# Patient Record
Sex: Female | Born: 1940 | Race: White | Hispanic: No | Smoking: Never smoker
Health system: Southern US, Community
[De-identification: ages and names within clinical notes are randomized; demographics above are authoritative.]

## PROBLEM LIST (undated history)

## (undated) DIAGNOSIS — I10 Essential (primary) hypertension: Secondary | ICD-10-CM

## (undated) DIAGNOSIS — E785 Hyperlipidemia, unspecified: Secondary | ICD-10-CM

## (undated) DIAGNOSIS — N1832 Chronic kidney disease, stage 3b: Secondary | ICD-10-CM

## (undated) DIAGNOSIS — E041 Nontoxic single thyroid nodule: Secondary | ICD-10-CM

## (undated) DIAGNOSIS — I2510 Atherosclerotic heart disease of native coronary artery without angina pectoris: Secondary | ICD-10-CM

## (undated) DIAGNOSIS — E119 Type 2 diabetes mellitus without complications: Secondary | ICD-10-CM

## (undated) HISTORY — PX: CHOLECYSTECTOMY: SHX55

## (undated) HISTORY — PX: ANKLE SURGERY: SHX546

## (undated) HISTORY — PX: EYE SURGERY: SHX253

## (undated) HISTORY — PX: HUMERUS SURGERY: SHX672

---

## 2006-02-02 ENCOUNTER — Encounter: Payer: Self-pay | Admitting: Internal Medicine

## 2006-07-18 ENCOUNTER — Other Ambulatory Visit: Payer: Self-pay

## 2006-07-18 ENCOUNTER — Emergency Department: Payer: Self-pay | Admitting: Emergency Medicine

## 2006-07-18 ENCOUNTER — Ambulatory Visit: Payer: Self-pay | Admitting: Emergency Medicine

## 2007-01-11 ENCOUNTER — Ambulatory Visit: Payer: Self-pay | Admitting: Internal Medicine

## 2008-07-26 ENCOUNTER — Ambulatory Visit: Payer: Self-pay | Admitting: Ophthalmology

## 2008-08-08 ENCOUNTER — Ambulatory Visit: Payer: Self-pay | Admitting: Ophthalmology

## 2009-02-14 ENCOUNTER — Ambulatory Visit: Payer: Self-pay | Admitting: Ophthalmology

## 2009-02-21 ENCOUNTER — Ambulatory Visit: Payer: Self-pay | Admitting: Ophthalmology

## 2010-03-28 ENCOUNTER — Ambulatory Visit: Payer: Self-pay | Admitting: Internal Medicine

## 2010-08-09 ENCOUNTER — Ambulatory Visit: Payer: Self-pay | Admitting: Internal Medicine

## 2011-09-10 ENCOUNTER — Emergency Department: Payer: Self-pay | Admitting: Emergency Medicine

## 2011-09-10 ENCOUNTER — Ambulatory Visit: Payer: Self-pay | Admitting: Family Medicine

## 2011-09-10 LAB — CBC
MCH: 25.5 pg — ABNORMAL LOW (ref 26.0–34.0)
MCHC: 31.6 g/dL — ABNORMAL LOW (ref 32.0–36.0)
MCV: 81 fL (ref 80–100)
Platelet: 244 10*3/uL (ref 150–440)
RBC: 5.49 10*6/uL — ABNORMAL HIGH (ref 3.80–5.20)
RDW: 15.5 % — ABNORMAL HIGH (ref 11.5–14.5)

## 2011-09-10 LAB — BASIC METABOLIC PANEL
Calcium, Total: 9.2 mg/dL (ref 8.5–10.1)
Co2: 30 mmol/L (ref 21–32)
Creatinine: 1.82 mg/dL — ABNORMAL HIGH (ref 0.60–1.30)
EGFR (African American): 31 — ABNORMAL LOW
EGFR (Non-African Amer.): 27 — ABNORMAL LOW
Glucose: 68 mg/dL (ref 65–99)
Potassium: 4.1 mmol/L (ref 3.5–5.1)
Sodium: 144 mmol/L (ref 136–145)

## 2011-12-22 ENCOUNTER — Other Ambulatory Visit: Payer: Self-pay | Admitting: Podiatry

## 2012-08-26 ENCOUNTER — Inpatient Hospital Stay: Payer: Self-pay | Admitting: Student

## 2012-08-26 LAB — COMPREHENSIVE METABOLIC PANEL
Albumin: 3.8 g/dL (ref 3.4–5.0)
Anion Gap: 8 (ref 7–16)
BUN: 27 mg/dL — ABNORMAL HIGH (ref 7–18)
Bilirubin,Total: 0.3 mg/dL (ref 0.2–1.0)
Calcium, Total: 9.5 mg/dL (ref 8.5–10.1)
Co2: 28 mmol/L (ref 21–32)
Creatinine: 1.94 mg/dL — ABNORMAL HIGH (ref 0.60–1.30)
EGFR (African American): 29 — ABNORMAL LOW
EGFR (Non-African Amer.): 25 — ABNORMAL LOW
Glucose: 75 mg/dL (ref 65–99)
Osmolality: 287 (ref 275–301)
Potassium: 3.2 mmol/L — ABNORMAL LOW (ref 3.5–5.1)
SGPT (ALT): 39 U/L (ref 12–78)
Sodium: 142 mmol/L (ref 136–145)
Total Protein: 8 g/dL (ref 6.4–8.2)

## 2012-08-26 LAB — URINALYSIS, COMPLETE
Bilirubin,UR: NEGATIVE
Glucose,UR: NEGATIVE mg/dL (ref 0–75)
Nitrite: POSITIVE
RBC,UR: 6 /HPF (ref 0–5)
Specific Gravity: 1.021 (ref 1.003–1.030)
Squamous Epithelial: 9
WBC UR: 108 /HPF (ref 0–5)

## 2012-08-26 LAB — CBC
HCT: 43.2 % (ref 35.0–47.0)
HGB: 14.4 g/dL (ref 12.0–16.0)
MCH: 26 pg (ref 26.0–34.0)
MCHC: 33.4 g/dL (ref 32.0–36.0)
Platelet: 273 10*3/uL (ref 150–440)
RBC: 5.54 10*6/uL — ABNORMAL HIGH (ref 3.80–5.20)
WBC: 10.6 10*3/uL (ref 3.6–11.0)

## 2012-08-26 LAB — CK TOTAL AND CKMB (NOT AT ARMC): CK-MB: 2.9 ng/mL (ref 0.5–3.6)

## 2012-08-27 LAB — BASIC METABOLIC PANEL
Anion Gap: 10 (ref 7–16)
Calcium, Total: 8.9 mg/dL (ref 8.5–10.1)
Chloride: 107 mmol/L (ref 98–107)
Co2: 25 mmol/L (ref 21–32)
Creatinine: 1.45 mg/dL — ABNORMAL HIGH (ref 0.60–1.30)
EGFR (African American): 41 — ABNORMAL LOW
EGFR (Non-African Amer.): 35 — ABNORMAL LOW
Osmolality: 290 (ref 275–301)
Sodium: 142 mmol/L (ref 136–145)

## 2012-08-28 LAB — BASIC METABOLIC PANEL
Anion Gap: 8 (ref 7–16)
Calcium, Total: 8.9 mg/dL (ref 8.5–10.1)
Chloride: 109 mmol/L — ABNORMAL HIGH (ref 98–107)
EGFR (African American): 49 — ABNORMAL LOW
EGFR (Non-African Amer.): 42 — ABNORMAL LOW
Glucose: 153 mg/dL — ABNORMAL HIGH (ref 65–99)
Osmolality: 285 (ref 275–301)
Potassium: 3.8 mmol/L (ref 3.5–5.1)
Sodium: 140 mmol/L (ref 136–145)

## 2012-08-28 LAB — URINE CULTURE

## 2013-03-14 ENCOUNTER — Ambulatory Visit: Payer: Self-pay | Admitting: Physician Assistant

## 2013-03-14 LAB — URINALYSIS, COMPLETE
Bilirubin,UR: NEGATIVE
Blood: NEGATIVE
KETONE: NEGATIVE
NITRITE: NEGATIVE
Ph: 6 (ref 4.5–8.0)
Specific Gravity: 1.02 (ref 1.003–1.030)

## 2013-03-16 LAB — URINE CULTURE

## 2013-07-05 ENCOUNTER — Ambulatory Visit: Payer: Self-pay | Admitting: Emergency Medicine

## 2013-09-22 ENCOUNTER — Ambulatory Visit: Payer: Self-pay

## 2014-01-20 ENCOUNTER — Ambulatory Visit: Payer: Self-pay | Admitting: Physician Assistant

## 2014-01-20 LAB — URINALYSIS, COMPLETE
BILIRUBIN, UR: NEGATIVE
GLUCOSE, UR: NEGATIVE
Ketone: NEGATIVE
Nitrite: NEGATIVE
Ph: 5.5 (ref 5.0–8.0)
SPECIFIC GRAVITY: 1.02 (ref 1.000–1.030)

## 2014-01-22 LAB — URINE CULTURE

## 2014-05-28 ENCOUNTER — Emergency Department
Admit: 2014-05-28 | Disposition: A | Discharge status: Home / Self Care | Payer: Self-pay | Admitting: Emergency Medicine

## 2014-05-28 LAB — CBC WITH DIFFERENTIAL/PLATELET
Basophil #: 0.1 10*3/uL (ref 0.0–0.1)
Basophil %: 1.1 %
EOS ABS: 0.3 10*3/uL (ref 0.0–0.7)
Eosinophil %: 3.6 %
HCT: 41.3 % (ref 35.0–47.0)
HGB: 13.3 g/dL (ref 12.0–16.0)
LYMPHS ABS: 4.2 10*3/uL — AB (ref 1.0–3.6)
Lymphocyte %: 44.6 %
MCH: 25.9 pg — AB (ref 26.0–34.0)
MCHC: 32.1 g/dL (ref 32.0–36.0)
MCV: 81 fL (ref 80–100)
MONO ABS: 0.9 x10 3/mm (ref 0.2–0.9)
Monocyte %: 9.9 %
Neutrophil #: 3.8 10*3/uL (ref 1.4–6.5)
Neutrophil %: 40.8 %
Platelet: 217 10*3/uL (ref 150–440)
RBC: 5.13 10*6/uL (ref 3.80–5.20)
RDW: 15.5 % — ABNORMAL HIGH (ref 11.5–14.5)
WBC: 9.4 10*3/uL (ref 3.6–11.0)

## 2014-05-28 LAB — COMPREHENSIVE METABOLIC PANEL
ALBUMIN: 3.7 g/dL
ALK PHOS: 103 U/L
AST: 28 U/L
Anion Gap: 6 — ABNORMAL LOW (ref 7–16)
BUN: 21 mg/dL — AB
Bilirubin,Total: 0.6 mg/dL
CHLORIDE: 109 mmol/L
Calcium, Total: 9.3 mg/dL
Co2: 26 mmol/L
Creatinine: 1.36 mg/dL — ABNORMAL HIGH
EGFR (Non-African Amer.): 38 — ABNORMAL LOW
GFR CALC AF AMER: 44 — AB
GLUCOSE: 142 mg/dL — AB
POTASSIUM: 4.3 mmol/L
SGPT (ALT): 32 U/L
Sodium: 141 mmol/L
TOTAL PROTEIN: 6.7 g/dL

## 2014-06-16 NOTE — Discharge Summary (Signed)
PATIENT NAME:  Laura Hatfield, Laura Hatfield MR#:  [id number] DATE OF BIRTH:  March 23, 1940  DATE OF ADMISSION:  08/26/2012 DATE OF DISCHARGE:  08/29/2012  PRIMARY CARE PHYSICIAN: Dr.  Olin Hauser at [facility].   CHIEF COMPLAINT:  Generalized weakness, near syncope feeling, cramps.   DISCHARGE DIAGNOSES: 1.  Generalized weakness, likely in the setting of dehydration, hyperkalemia and urinary tract infection.  2.  Acute on likely chronic renal failure.  3.  Urinary tract infection.  4.  Diabetes.  5.  Hypertension.  6.  Chronic congestive heart failure, compensated, unclear if systolic or diastolic.  7.  History of coronary artery disease status post stents.  8.  History of peripheral neuropathy.  9.  History of dyslipidemia.  10.  History of osteoarthritis.   DISCHARGE MEDICATIONS:  1.  Cipro 250 mg twice a day for two days.  2.   Aspirin 81 mg daily.  3.  Clopidogrel 75 mg daily.  4.  Vitamin B12 tabs 1000 mcg 3 times once a day.  5.  Vytorin 10/40, 1 tab once a day.  6.  Gabapentin 300 mg daily as needed for neuropathy.  7.  Lantus 40 units once a day in the evening.  8.  Losartan 100 mg daily.  9.  Metoprolol succinate 100 mg once a day.  10.  Multivitamin 1 tab once a day.  11.  Nitroglycerin 0.4 mg sublingual every five minutes as needed for chest pain.  12.  Lasix 40 mg daily.   DIET: Low sodium, low fat, low cholesterol, ADA diet.   ACTIVITY: As tolerated.   FOLLOWUP: Please follow with PCP within 1 to 2 weeks and obtain a BMP within a week. Please follow with your cardiologist within 1 to 2 weeks.   DISPOSITION: Home.   CODE STATUS: Full code.   SIGNIFICANT LABORATORY, DIAGNOSTIC AND RADIOLOGIC DATA:  Urinalysis initially was positive for nitrites, 3+ leukocyte esterase, 6 RBC and 108 WBC with 2+ bacteria. Urine cultures grew 60,000 CFU of Escherichia coli ulcer with mixed bacterial organisms, which was pansensitive. Initial white count of 10, WBC of 14.4 and  platelets of 273. Troponin negative. Alkaline phosphatase 142; otherwise, LFTs within normal limits. Initial creatinine was 1.94 and potassium of 3.2. By July 5, it was 1.25 with hydration with potassium of 3.8.   HISTORY OF PRESENT ILLNESS AND HOSPITAL COURSE: For full details of H and P, please see the dictation by Dr. Rudene Re on July 3,  but briefly this is a pleasant 74 year old female with history of obesity, hypertension, diabetes, chronic kidney disease, coronary artery disease, status post stents, who came in for generalized weakness and some cramps. She had no fevers, chills, vomiting, or diarrhea and denied any urinary symptoms, but was admitted to the hospitalist service. She was noted to have a positive urinalysis, which was positive with nitrites and leukocyte esterase with some WBC and urine as well. She was also noted to have, creatinine 1.9 and some hypokalemia. She was admitted to the hospitalist service, started on ceftriaxone. Urine cultures were sent and the Lasix was held. The patient appeared to be dehydrated on exam and improved with hydration. The urine cultures came back 60,000 CFU, but along with other mixed flora. She had been started on ceftriaxone and has had three doses and will be discharged with two days of Cipro. It is unclear where her creatinine is; however, given the fact that she had elevated BUN, creatinine and hypokalemia perhaps. The creatinine of 1.9, is  not her baseline. It is possible she is chronic kidney disease stage III. By the date of discharge, she has been ambulating in the hall. Her potassium has been repleted and her weakness has significantly improved. She will be discharged on a lower dose of Lasix for now until she is seen by PCP and cardiologist. She appears to be compensated and did not go into congestive heart failure with IV fluids. She will be discharged with outpatient follow-up.   TOTAL TIME SPENT: 35 minutes.   CODE STATUS: The patient is full code    ____________________________ Krystal EatonShayiq Rayyan Orsborn, MD sa:cc D: 08/29/2012 12:17:42 ET T: 08/29/2012 20:36:39 ET JOB#: [id number]  cc: Krystal EatonShayiq Abri Vacca, MD, <Dictator> Olin HauserBruce Peyser, MD, [facility]AY[facility] Madolyn Ackroyd MD ELECTRONICALLY SIGNED 09/20/2012 14:32

## 2014-06-16 NOTE — H&P (Signed)
PATIENT NAME:  Laura Hatfield, Laura Hatfield MR#:  [id number] DATE OF BIRTH:  December 27, 1940  DATE OF ADMISSION:  08/26/2012  PRIMARY CARE PHYSICIAN[facility]  REFERRING PHYSICIAN: Dr. Bayard Malesandolph Brown   CHIEF COMPLAINT: Generalized weakness, near syncope feeling, muscle cramps.   HISTORY OF PRESENT ILLNESS: Ms. Laura Hatfield is a 74 year old African American female with history of systemic hypertension, diabetes mellitus type 2 on insulin, chronic kidney disease stage IV, coronary artery disease, peripheral neuropathy. She was in her usual state of health until the last 24 hours. For some reason she feels generalized weakness.  She is unable to hold her strength,  feels she is going to pass out without an episode of true loss of consciousness, has nausea, muscular cramps in the abdomen and her legs. Denies any fever. No chills. No vomiting. No diarrhea. No urinary symptoms. The patient was brought to the hospital for evaluation, and she was found to have urinary tract infection along with finding of dehydration, slight worsening of her kidney function. Her baseline creatinine was 1.8 a year ago, now it is 1.9, and she has mild hypokalemia.   REVIEW OF SYSTEMS:   CONSTITUTIONAL: Denies any fever. No chills, but she has generalized fatigue.  EYES: No blurring of vision. No double vision.  ENT: No hearing impairment. No sore throat. No dysphagia.  CARDIOVASCULAR: Denies any chest pain. No shortness of breath. No true syncope, but she has near syncope feeling.  RESPIRATORY: No cough. No sputum production. No chest pain. No shortness of breath.  GASTROINTESTINAL: No abdominal pain, but she has on and off muscular cramps, nonspecified, and nausea but no vomiting, no diarrhea, no melena or hematochezia.  GENITOURINARY: Denies dysuria. No frequency of urination.  MUSCULOSKELETAL: She has chronic aching pains but no joint swelling and no specific joint pain. She has muscular cramps but no  muscular swelling.  INTEGUMENTARY: No skin rash. No ulcers.  NEUROLOGY: No focal weakness. No seizure activity. No headache.  PSYCHIATRY: No anxiety. No depression.  ENDOCRINE: No polyuria or polydipsia. No heat or cold intolerance.  PAST MEDICAL HISTORY: Coronary artery disease, status post stent implant, systemic hypertension, congestive heart failure compensated. We do not have information about her ejection fraction. [facility]. Diabetes mellitus type 2 on insulin, peripheral neuropathy, chronic kidney disease stage IV, dyslipidemia, osteoarthritis.   PAST SURGICAL HISTORY: Cholecystectomy, knee surgery for meniscal tear, ankle fracture surgery. She had lung surgery many years ago, thought she had tuberculosis but it was not. She does not know what the final pathology was.   SOCIAL HABITS: Nonsmoker. No history of alcohol or drug abuse.   SOCIAL HISTORY: She is divorced and lives at home alone.   FAMILY HISTORY: Her mother is alive, and she suffers from diabetes mellitus. Her father is deceased, and he died from complications of stroke.   ADMISSION MEDICATIONS: Vytorin 10/40 once a day, nitroglycerin sublingual 0.4 mg q. 5 minutes p.r.n., naproxen 220 mg 2 tablets q. 8 hours p.r.n., multivitamin once a day, metoprolol succinate 100 mg once a day, losartan 100 mg once a day, Lantus 40 units at night, gabapentin 100 mg 3 capsules, that is 300 mg, 3 times a day as needed, furosemide 40 mg taking 1-1/2 tablets, that is a 60 mg, once a day, vitamin B12 1000 mcg 3 tablets once a day in the evening, Plavix 75 mg once a day, aspirin 81 mg a day.   ALLERGIES: LIPITOR CAUSING SOME  AGITATION, PENICILLIN CAUSING RASH AND LISINOPRIL.   PHYSICAL EXAMINATION: VITAL SIGNS: Blood pressure 117/69. She has orthostatic blood pressure and pulse. There are no changes.  Respiratory 20, pulse 68, O2 saturation 99%.  GENERAL APPEARANCE: Elderly female lying in bed in no  acute distress.  HEENT: No pallor. No icterus. No cyanosis. Ear examination revealed normal hearing, no discharge, no lesions. Examination of the nose showed no discharge, no bleeding, no ulcers. Examination of the mouth showed normal lips and tongue except for dry mucous membranes, no oral thrush. Eye examination revealed normal eyelids and conjunctivae. Pupils are about 3 mm, round, equal and sluggishly reactive to light.  NECK: Supple. Trachea at midline. No thyromegaly. No cervical lymphadenopathies.  HEART: Revealed normal S1, S2. No S3, S4. No murmur. No gallop. No carotid bruits.   RESPIRATORY: Normal breathing pattern without use of accessory muscles. No rales. No wheezing.  ABDOMEN: Soft without tenderness. No hepatosplenomegaly. No masses. No hernias.  SKIN: Revealed no ulcers. No subcutaneous nodules.  MUSCULOSKELETAL: No joint swelling. No clubbing.  NEUROLOGIC: Cranial nerves II through XII are intact. No focal motor deficit.  PSYCHIATRIC: The patient is alert and oriented x 3. Mood and affect were normal.   LABORATORY AND RADIOLOGICAL FINDINGS:  CAT scan of the abdomen showed no evidence of bowel obstruction or any acute findings. There is enlargement of the uterus, may reflect large fibroid.  EKG showed normal sinus rhythm at rate of 71 per minute. Nonspecific T wave abnormalities. Serum glucose 75, BUN 27, creatinine 1.94, sodium 142, potassium 3.2. Estimated GFR is 29. Normal liver function tests except for slight elevation of alkaline phosphatase at 142, AST 23, ALT 39. Troponin less than 0.02. CBC showed white count of 10,000, hemoglobin 14, hematocrit 43, platelet count 273. Urinalysis showed cloudy urine with positive nitrite, +3 leukocyte esterase, 108 white blood cells, +2 bacteria.   ASSESSMENT: 1.  Generalized weakness. 2.  Near syncope.  3.  Urinary tract infection, appears to be lower urinary tract infection.  4.  Chronic kidney disease stage IV.  5.  Hypokalemia.   6.  Hypertension.  7.  Diabetes mellitus type 2.  8.  Coronary artery disease, status post stent implant.  9.  History of congestive heart failure which is compensated.  10.  Peripheral neuropathy.   PLAN: We will admit the patient to the medical floor. IV hydration, intravenous Rocephin. Potassium supplementation to correct the hypokalemia. IV fluid. Hold Lasix and naproxen given her kidney function and the dehydration as well. Repeat basic metabolic profile tomorrow. Urine for culture and sensitivity. Continue the rest of her home medications. For deep vein thrombosis prophylaxis, we will place her on Lovenox 30 mg subcutaneous once a day.   CODE STATUS:  The patient indicates that she does have a LIVING WILL and her CODE STATUS IS FULL CODE unless she is in a vegetative state, then she does not want to be placed on life support indefinitely.  TIME TAKEN: Time spent in evaluating this patient and reviewing the data with her and with the rest of her family took more than 55 minutes.  ____________________________ Carney Corners. Rudene Re, MD amd:cb D: 08/26/2012 22:41:23 ET T: 08/26/2012 23:09:21 ET JOB#: [id number]  cc: Carney Corners. Rudene Re, MD, <Dictator> Zollie Scale MD ELECTRONICALLY SIGNED 08/27/2012 6:25

## 2016-03-21 ENCOUNTER — Observation Stay: Payer: Medicare HMO

## 2016-03-21 ENCOUNTER — Emergency Department: Payer: Medicare HMO

## 2016-03-21 ENCOUNTER — Observation Stay
Admission: EM | Admit: 2016-03-21 | Discharge: 2016-03-22 | Disposition: A | Discharge status: Home / Self Care | Payer: Medicare HMO | Attending: Internal Medicine | Admitting: Internal Medicine

## 2016-03-21 ENCOUNTER — Encounter: Payer: Self-pay | Admitting: Emergency Medicine

## 2016-03-21 DIAGNOSIS — E119 Type 2 diabetes mellitus without complications: Secondary | ICD-10-CM | POA: Diagnosis not present

## 2016-03-21 DIAGNOSIS — Z79899 Other long term (current) drug therapy: Secondary | ICD-10-CM | POA: Diagnosis not present

## 2016-03-21 DIAGNOSIS — I10 Essential (primary) hypertension: Secondary | ICD-10-CM | POA: Diagnosis not present

## 2016-03-21 DIAGNOSIS — Z7982 Long term (current) use of aspirin: Secondary | ICD-10-CM | POA: Diagnosis not present

## 2016-03-21 DIAGNOSIS — E785 Hyperlipidemia, unspecified: Secondary | ICD-10-CM | POA: Insufficient documentation

## 2016-03-21 DIAGNOSIS — R4701 Aphasia: Secondary | ICD-10-CM

## 2016-03-21 DIAGNOSIS — I2510 Atherosclerotic heart disease of native coronary artery without angina pectoris: Secondary | ICD-10-CM | POA: Insufficient documentation

## 2016-03-21 DIAGNOSIS — G459 Transient cerebral ischemic attack, unspecified: Secondary | ICD-10-CM

## 2016-03-21 DIAGNOSIS — R479 Unspecified speech disturbances: Secondary | ICD-10-CM | POA: Diagnosis present

## 2016-03-21 DIAGNOSIS — Z794 Long term (current) use of insulin: Secondary | ICD-10-CM | POA: Insufficient documentation

## 2016-03-21 DIAGNOSIS — Z791 Long term (current) use of non-steroidal anti-inflammatories (NSAID): Secondary | ICD-10-CM | POA: Insufficient documentation

## 2016-03-21 DIAGNOSIS — I639 Cerebral infarction, unspecified: Principal | ICD-10-CM | POA: Insufficient documentation

## 2016-03-21 DIAGNOSIS — Z7902 Long term (current) use of antithrombotics/antiplatelets: Secondary | ICD-10-CM | POA: Diagnosis not present

## 2016-03-21 HISTORY — DX: Nontoxic single thyroid nodule: E04.1

## 2016-03-21 HISTORY — DX: Hyperlipidemia, unspecified: E78.5

## 2016-03-21 HISTORY — DX: Atherosclerotic heart disease of native coronary artery without angina pectoris: I25.10

## 2016-03-21 HISTORY — DX: Type 2 diabetes mellitus without complications: E11.9

## 2016-03-21 LAB — CBC
HEMATOCRIT: 43.5 % (ref 35.0–47.0)
HEMOGLOBIN: 14.2 g/dL (ref 12.0–16.0)
MCH: 25.8 pg — ABNORMAL LOW (ref 26.0–34.0)
MCHC: 32.7 g/dL (ref 32.0–36.0)
MCV: 78.8 fL — ABNORMAL LOW (ref 80.0–100.0)
Platelets: 238 10*3/uL (ref 150–440)
RBC: 5.52 MIL/uL — AB (ref 3.80–5.20)
RDW: 15.1 % — ABNORMAL HIGH (ref 11.5–14.5)
WBC: 8.3 10*3/uL (ref 3.6–11.0)

## 2016-03-21 LAB — COMPREHENSIVE METABOLIC PANEL
ALBUMIN: 3.9 g/dL (ref 3.5–5.0)
ALK PHOS: 100 U/L (ref 38–126)
ALT: 23 U/L (ref 14–54)
AST: 31 U/L (ref 15–41)
Anion gap: 7 (ref 5–15)
BILIRUBIN TOTAL: 0.6 mg/dL (ref 0.3–1.2)
BUN: 22 mg/dL — ABNORMAL HIGH (ref 6–20)
CO2: 27 mmol/L (ref 22–32)
CREATININE: 1.42 mg/dL — AB (ref 0.44–1.00)
Calcium: 9.2 mg/dL (ref 8.9–10.3)
Chloride: 104 mmol/L (ref 101–111)
GFR calc Af Amer: 40 mL/min — ABNORMAL LOW (ref >60)
GFR, EST NON AFRICAN AMERICAN: 35 mL/min — AB (ref >60)
GLUCOSE: 205 mg/dL — AB (ref 65–99)
Potassium: 4.3 mmol/L (ref 3.5–5.1)
Sodium: 138 mmol/L (ref 135–145)
Total Protein: 7.4 g/dL (ref 6.5–8.1)

## 2016-03-21 LAB — GLUCOSE, CAPILLARY
GLUCOSE-CAPILLARY: 141 mg/dL — AB (ref 65–99)
Glucose-Capillary: 184 mg/dL — ABNORMAL HIGH (ref 65–99)
Glucose-Capillary: 191 mg/dL — ABNORMAL HIGH (ref 65–99)

## 2016-03-21 LAB — TROPONIN I: Troponin I: <0.03 ng/mL (ref <0.03)

## 2016-03-21 LAB — DIFFERENTIAL
BASOS ABS: 0.1 10*3/uL (ref 0–0.1)
Basophils Relative: 1 %
Eosinophils Absolute: 0.3 10*3/uL (ref 0–0.7)
Eosinophils Relative: 3 %
LYMPHS ABS: 3.5 10*3/uL (ref 1.0–3.6)
LYMPHS PCT: 42 %
MONOS PCT: 11 %
Monocytes Absolute: 0.9 10*3/uL (ref 0.2–0.9)
NEUTROS ABS: 3.6 10*3/uL (ref 1.4–6.5)
Neutrophils Relative %: 43 %

## 2016-03-21 LAB — PROTIME-INR
INR: 0.96
Prothrombin Time: 12.8 seconds (ref 11.4–15.2)

## 2016-03-21 LAB — APTT: APTT: 37 s — AB (ref 24–36)

## 2016-03-21 MED ORDER — ASPIRIN EC 81 MG PO TBEC
81.0000 mg | DELAYED_RELEASE_TABLET | Freq: Every day | ORAL | Status: DC
  Administered 2016-03-21 – 2016-03-22 (×2): 81 mg via ORAL
  Filled 2016-03-21 (×2): qty 1

## 2016-03-21 MED ORDER — INSULIN ASPART PROT & ASPART (70-30 MIX) 100 UNIT/ML ~~LOC~~ SUSP
45.0000 [IU] | Freq: Every day | SUBCUTANEOUS | Status: DC
  Administered 2016-03-21: 45 [IU] via SUBCUTANEOUS
  Filled 2016-03-21: qty 45

## 2016-03-21 MED ORDER — INSULIN ASPART 100 UNIT/ML ~~LOC~~ SOLN
0.0000 [IU] | Freq: Three times a day (TID) | SUBCUTANEOUS | Status: DC
  Administered 2016-03-21: 1 [IU] via SUBCUTANEOUS
  Administered 2016-03-22: 2 [IU] via SUBCUTANEOUS
  Filled 2016-03-21: qty 2
  Filled 2016-03-21: qty 1

## 2016-03-21 MED ORDER — POLYETHYLENE GLYCOL 3350 17 G PO PACK
17.0000 g | PACK | Freq: Every day | ORAL | Status: DC | PRN
Start: 2016-03-21 — End: 2016-03-22

## 2016-03-21 MED ORDER — DULAGLUTIDE 0.75 MG/0.5ML ~~LOC~~ SOAJ: 0.7500 mg | SUBCUTANEOUS | Status: DC

## 2016-03-21 MED ORDER — ACETAMINOPHEN 650 MG RE SUPP: 650.0000 mg | Freq: Four times a day (QID) | RECTAL | Status: DC | PRN

## 2016-03-21 MED ORDER — ONDANSETRON HCL 4 MG PO TABS: 4.0000 mg | ORAL_TABLET | Freq: Four times a day (QID) | ORAL | Status: DC | PRN

## 2016-03-21 MED ORDER — METOPROLOL SUCCINATE ER 100 MG PO TB24
150.0000 mg | ORAL_TABLET | Freq: Every day | ORAL | Status: DC
  Administered 2016-03-21: 150 mg via ORAL
  Filled 2016-03-21 (×2): qty 2

## 2016-03-21 MED ORDER — INSULIN ASPART PROT & ASPART (70-30 MIX) 100 UNIT/ML ~~LOC~~ SUSP
40.0000 [IU] | Freq: Every day | SUBCUTANEOUS | Status: DC
  Administered 2016-03-22: 40 [IU] via SUBCUTANEOUS
  Filled 2016-03-21: qty 40

## 2016-03-21 MED ORDER — ACETAMINOPHEN 325 MG PO TABS: 650.0000 mg | ORAL_TABLET | Freq: Four times a day (QID) | ORAL | Status: DC | PRN

## 2016-03-21 MED ORDER — FUROSEMIDE 40 MG PO TABS: 40.0000 mg | ORAL_TABLET | Freq: Every day | ORAL | Status: DC | PRN

## 2016-03-21 MED ORDER — CLOPIDOGREL BISULFATE 75 MG PO TABS
75.0000 mg | ORAL_TABLET | Freq: Every day | ORAL | Status: DC
  Administered 2016-03-21 – 2016-03-22 (×2): 75 mg via ORAL
  Filled 2016-03-21 (×2): qty 1

## 2016-03-21 MED ORDER — ONDANSETRON HCL 4 MG/2ML IJ SOLN: 4.0000 mg | Freq: Four times a day (QID) | INTRAMUSCULAR | Status: DC | PRN

## 2016-03-21 MED ORDER — ROSUVASTATIN CALCIUM 20 MG PO TABS
40.0000 mg | ORAL_TABLET | Freq: Every day | ORAL | Status: DC
  Administered 2016-03-21 – 2016-03-22 (×2): 40 mg via ORAL
  Filled 2016-03-21 (×2): qty 2

## 2016-03-21 MED ORDER — VITAMIN B-12 1000 MCG PO TABS
2000.0000 ug | ORAL_TABLET | Freq: Every day | ORAL | Status: DC
  Administered 2016-03-22: 09:00:00 2000 ug via ORAL
  Filled 2016-03-21: qty 2

## 2016-03-21 MED ORDER — ENOXAPARIN SODIUM 40 MG/0.4ML ~~LOC~~ SOLN
40.0000 mg | SUBCUTANEOUS | Status: DC
  Administered 2016-03-21: 40 mg via SUBCUTANEOUS
  Filled 2016-03-21: qty 0.4

## 2016-03-21 NOTE — H&P (Signed)
[facility] [place] [facility]   PATIENT NAME: Laura Hatfield    MR#:  [id number]  DATE OF BIRTH:  Mar 23, 1940  DATE OF ADMISSION:  03/21/2016  PRIMARY CARE PHYSICIAN: PEYSER,BRUCE   REQUESTING/REFERRING PHYSICIAN: dr Thomasene Mohairpaduhowski  CHIEF COMPLAINT:  Difficulty producing words yesterday and this morning Complains of dizziness and headache  HISTORY OF PRESENT ILLNESS:  Laura Hatfield  is a 76 y.o. female with a known history of Hyperlipidemia, diabetes, coronary artery disease comes to the emergency room after she started having dizziness and an episode of headache yesterday. Patient noticed some expressive aphasia while she was trying to talk to her mother's caregiver while fixing breakfast this morning. It looks it happened again on 11 AM she got worried came to the emergency room and code stroke was initiated. CT head was negative. She was seen by Dr. Thad Rangereynolds from neurology and he is now being admitted for TIA. She is hemodynamically stable speech is clear to my evaluation. Patient denies any other complaints at present.  PAST MEDICAL HISTORY:   Past Medical History:  Diagnosis Date  . CAD (coronary artery disease)   . Diabetes mellitus without complication (HCC)   . Hyperlipidemia   . Thyroid nodule     PAST SURGICAL HISTOIRY:   Past Surgical History:  Procedure Laterality Date  . ANKLE SURGERY    . CHOLECYSTECTOMY    . EYE SURGERY    . HUMERUS SURGERY      SOCIAL HISTORY:   Social History  Substance Use Topics  . Smoking status: Never Smoker  . Smokeless tobacco: Never Used  . Alcohol use No    FAMILY HISTORY:  No family history on file.  DRUG ALLERGIES:   Allergies  Allergen Reactions  . Atorvastatin Other (See Comments)    Dr told her taking these were affecting her liver  . Lisinopril Cough  . Penicillins Swelling    Has patient had a PCN reaction causing immediate rash, facial/tongue/throat swelling, SOB or  lightheadedness with hypotension: yes Has patient had a PCN reaction causing severe rash involving mucus membranes or skin necrosis: no Has patient had a PCN reaction that required hospitalization no Has patient had a PCN reaction occurring within the last 10 years: no If all of the above answers are "NO", then may proceed with Cephalosporin use.     REVIEW OF SYSTEMS:  Review of Systems  Constitutional: Negative for chills, fever and weight loss.  HENT: Negative for ear discharge, ear pain and nosebleeds.   Eyes: Negative for blurred vision, pain and discharge.  Respiratory: Negative for sputum production, shortness of breath, wheezing and stridor.   Cardiovascular: Negative for chest pain, palpitations, orthopnea and PND.  Gastrointestinal: Negative for abdominal pain, diarrhea, nausea and vomiting.  Genitourinary: Negative for frequency and urgency.  Musculoskeletal: Negative for back pain and joint pain.  Neurological: Positive for speech change, weakness and headaches. Negative for sensory change and focal weakness.  Psychiatric/Behavioral: Negative for depression and hallucinations. The patient is not nervous/anxious.      MEDICATIONS AT HOME:   Prior to Admission medications   Medication Sig Start Date End Date Taking? Authorizing Provider  aspirin EC 81 MG tablet Take 81 mg by mouth daily.   Yes Historical Provider, MD  clopidogrel (PLAVIX) 75 MG tablet Take 75 mg by mouth daily.   Yes Historical Provider, MD  cyanocobalamin 2000 MCG tablet Take 2,000 mcg by mouth daily.   Yes Historical Provider, MD  Dulaglutide (TRULICITY)  0.75 MG/0.5ML SOPN Inject 0.75 mg into the skin once a week.   Yes Historical Provider, MD  furosemide (LASIX) 40 MG tablet Take 1-1.5 tablets by mouth daily as needed. 01/04/15  Yes Historical Provider, MD  insulin NPH-regular Human (NOVOLIN 70/30) (70-30) 100 UNIT/ML injection Inject into the skin.   Yes Historical Provider, MD  metoprolol succinate  (TOPROL-XL) 100 MG 24 hr tablet Take 1.5 tablets by mouth daily. Need to call pharmacy to verify dosage    Yes Historical Provider, MD  naproxen sodium (ANAPROX) 220 MG tablet Take 220 mg by mouth 2 (two) times daily with a meal.   Yes Historical Provider, MD  nitroGLYCERIN (NITROSTAT) 0.4 MG SL tablet Place 1 tablet under the tongue every 5 (five) minutes as needed for chest pain. 03/30/14  Yes Historical Provider, MD  rosuvastatin (CRESTOR) 40 MG tablet Take 1 tablet by mouth daily. 09/22/15  Yes Historical Provider, MD      VITAL SIGNS:  Blood pressure 139/71, pulse 70, temperature 97.9 F (36.6 C), resp. rate 14, height 5\' 7"  (1.702 m), weight 107.5 kg (237 lb), SpO2 100 %.  PHYSICAL EXAMINATION:  GENERAL:  76 y.o.-year-old patient lying in the bed with no acute distress. Obese  EYES: Pupils equal, round, reactive to light and accommodation. No scleral icterus. Extraocular muscles intact.  HEENT: Head atraumatic, normocephalic. Oropharynx and nasopharynx clear.  NECK:  Supple, no jugular venous distention. No thyroid enlargement, no tenderness.  LUNGS: Normal breath sounds bilaterally, no wheezing, rales,rhonchi or crepitation. No use of accessory muscles of respiration.  CARDIOVASCULAR: S1, S2 normal. No murmurs, rubs, or gallops.  ABDOMEN: Soft, nontender, nondistended. Bowel sounds present. No organomegaly or mass.  EXTREMITIES: No pedal edema, cyanosis, or clubbing.  NEUROLOGIC: Cranial nerves II through XII are intact. Muscle strength 5/5 in all extremities. Sensation intact. Gait not checked.  expressive aphasia.  PSYCHIATRIC: The patient is alert and oriented x 3.  SKIN: No obvious rash, lesion, or ulcer.   LABORATORY PANEL:   CBC  Recent Labs Lab 03/21/16 1247  WBC 8.3  HGB 14.2  HCT 43.5  PLT 238   ------------------------------------------------------------------------------------------------------------------  Chemistries   Recent Labs Lab 03/21/16 1247  NA 138   K 4.3  CL 104  CO2 27  GLUCOSE 205*  BUN 22*  CREATININE 1.42*  CALCIUM 9.2  AST 31  ALT 23  ALKPHOS 100  BILITOT 0.6   ------------------------------------------------------------------------------------------------------------------  Cardiac Enzymes  Recent Labs Lab 03/21/16 1247  TROPONINI <0.03   ------------------------------------------------------------------------------------------------------------------  RADIOLOGY:  Ct Head Code Stroke W/o Cm  Result Date: 03/21/2016 CLINICAL DATA:  Code stroke. Episode of not being able to talk. Posterior headache going to the neck. EXAM: CT HEAD WITHOUT CONTRAST TECHNIQUE: Contiguous axial images were obtained from the base of the skull through the vertex without intravenous contrast. COMPARISON:  None. FINDINGS: Brain: No evidence of acute infarction, hemorrhage, hydrocephalus, extra-axial collection or shift. Confluent low-density in the cerebral white matter consistent with chronic microvascular disease, extensive. There is atrophy, most notable in the cerebellum with fourth ventriculomegaly. 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 Metastasis, lymphoma, or granulomatous disease, or suprasellar proteinaceous cyst are primary differential considerations. Further workup recommended. Vascular: Atherosclerotic calcification. Skull: No acute or aggressive finding Sinuses/Orbits: No acute finding Other: These results were called by telephone at the time of interpretation on 03/21/2016 at 12:51 pm to Dr. Lenard Lance , who verbally acknowledged these results. ASPECTS [facility] Stroke Program Early CT Score) - Ganglionic  level infarction (caudate, lentiform nuclei, internal capsule, insula, M1-M3 cortex): - Supraganglionic infarction (M4-M6 cortex): Total score (0-10 with 10 being normal): IMPRESSION: 1. No acute finding. ASPECTS is 10. 2. Advanced chronic microvascular disease. 3. 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 Recommend pituitary protocol brain MRI. Electronically Signed   By: Marnee Spring M.D.   On: 03/21/2016 12:57    EKG:   SR IMPRESSION AND PLAN:   Laura Hatfield  is a 76 y.o. female with a known history of Hyperlipidemia, diabetes, coronary artery disease comes to the emergency room after she started having dizziness and an episode of headache yesterday. Patient noticed some expressive aphasia while she was trying to talk to her mother's caregiver while fixing breakfast this morning.   1. TIA - patient presented with expressive aphasia and dizziness and headache. CT head negative. Symptoms resolved at present -Continue aspirin and Plavix -seen by neurology Dr. Emmaline Life recommends MRI ultrasound of the carotids  2. Hyperlipidemia continue Crestor  3. Hypertension continue metoprolol  4. Type 2 diabetes continue home dose insulin and sliding scale  5. DVT prophylaxis subcutaneous Lovenox  All the records are reviewed and case discussed with ED provider. Management plans discussed with the patient, family and they are in agreement.  CODE STATUS: full TOTAL TIME TAKING CARE OF THIS PATIENT: 50 minutes.    Laura Hatfield M.D on 03/21/2016 at 2:09 PM  Between 7am to 6pm - Pager - [phone]  After 6pm go to [email or web address] - password [email or web address] [facility]  [facility]  Office  [phone]  CC: Primary care physician; [facility]

## 2016-03-21 NOTE — Consult Note (Addendum)
Referring Physician: Mody    Chief Complaint: Difficulty with speech  HPI: Laura Hatfield is an 76 y.o. female who reports that on yesterday she had a 15 minute episode of difficulty getting her words out that resolved spontaneously.  Today while speaking with a Child psychotherapist had recurrence of the episode.  This again resolved spontaneously.  She presented for evaluation.  Initial NIHSS of 0.  Date last known well: Date: 03/21/2016 Time last known well: Time: 11:00 tPA Given: No: Resolution of symptoms  Past Medical History:  Diagnosis Date  . CAD (coronary artery disease)   . Diabetes mellitus without complication (HCC)   . Hyperlipidemia   . Thyroid nodule     Past Surgical History:  Procedure Laterality Date  . ANKLE SURGERY    . CHOLECYSTECTOMY    . EYE SURGERY    . HUMERUS SURGERY      Family history: Mother alive with HTN and DM.  Father deceased from stroke.  Brother deceased from esophageal cancer.    Social History:  reports that she has never smoked. She has never used smokeless tobacco. She reports that she does not drink alcohol. Her drug history is not on file.  Allergies:  Allergies  Allergen Reactions  . Atorvastatin Other (See Comments)    Dr told her taking these were affecting her liver  . Lisinopril Cough  . Penicillins Swelling    Has patient had a PCN reaction causing immediate rash, facial/tongue/throat swelling, SOB or lightheadedness with hypotension: yes Has patient had a PCN reaction causing severe rash involving mucus membranes or skin necrosis: no Has patient had a PCN reaction that required hospitalization no Has patient had a PCN reaction occurring within the last 10 years: no If all of the above answers are "NO", then may proceed with Cephalosporin use.     Medications:  I have reviewed the patient's current medications. Prior to Admission:  Prescriptions Prior to Admission  Medication Sig Dispense Refill Last Dose  . aspirin EC  81 MG tablet Take 81 mg by mouth daily.   03/20/2016 at 1900  . clopidogrel (PLAVIX) 75 MG tablet Take 75 mg by mouth daily.   03/20/2016 at 1900  . cyanocobalamin 2000 MCG tablet Take 2,000 mcg by mouth daily.   03/21/2016 at 0700  . Dulaglutide (TRULICITY) 0.75 MG/0.5ML SOPN Inject 0.75 mg into the skin once a week.   03/19/2016 at unknown  . furosemide (LASIX) 40 MG tablet Take 1-1.5 tablets by mouth daily as needed.   Past Week at Unknown time  . insulin NPH-regular Human (NOVOLIN 70/30) (70-30) 100 UNIT/ML injection Inject 30-45 Units into the skin 2 (two) times daily. Injects 30 units during the day and 45 qhs   03/20/2016 at unknown  . metoprolol succinate (TOPROL-XL) 100 MG 24 hr tablet Take 1.5 tablets by mouth daily. Need to call pharmacy to verify dosage    03/20/2016 at 1900  . naproxen sodium (ANAPROX) 220 MG tablet Take 220 mg by mouth 2 (two) times daily with a meal.   03/20/2016 at 1900  . nitroGLYCERIN (NITROSTAT) 0.4 MG SL tablet Place 1 tablet under the tongue every 5 (five) minutes as needed for chest pain.     . rosuvastatin (CRESTOR) 40 MG tablet Take 1 tablet by mouth daily.   03/20/2016 at 1900   Scheduled: . aspirin EC  81 mg Oral Daily  . clopidogrel  75 mg Oral Daily  . [START ON 03/26/2016] Dulaglutide  0.75 mg Subcutaneous  Weekly  . enoxaparin (LOVENOX) injection  40 mg Subcutaneous Q24H  . insulin aspart  0-9 Units Subcutaneous TID WC  . [START ON 03/22/2016] insulin aspart protamine- aspart  40 Units Subcutaneous Q breakfast  . insulin aspart protamine- aspart  45 Units Subcutaneous QHS  . metoprolol succinate  150 mg Oral Daily  . rosuvastatin  40 mg Oral Daily  . [START ON 03/22/2016] cyanocobalamin  2,000 mcg Oral Daily    ROS: History obtained from the patient  General ROS: negative for - chills, fatigue, fever, night sweats, weight gain or weight loss Psychological ROS: negative for - behavioral disorder, hallucinations, memory difficulties, mood swings or  suicidal ideation Ophthalmic ROS: negative for - blurry vision, double vision, eye pain or loss of vision ENT ROS: negative for - epistaxis, nasal discharge, oral lesions, sore throat, tinnitus or vertigo Allergy and Immunology ROS: negative for - hives or itchy/watery eyes Hematological and Lymphatic ROS: negative for - bleeding problems, bruising or swollen lymph nodes Endocrine ROS: negative for - galactorrhea, hair pattern changes, polydipsia/polyuria or temperature intolerance Respiratory ROS: negative for - cough, hemoptysis, shortness of breath or wheezing Cardiovascular ROS: negative for - chest pain, dyspnea on exertion, edema or irregular heartbeat Gastrointestinal ROS: negative for - abdominal pain, diarrhea, hematemesis, nausea/vomiting or stool incontinence Genito-Urinary ROS: negative for - dysuria, hematuria, incontinence or urinary frequency/urgency Musculoskeletal ROS: left ankle pain Neurological ROS: as noted in HPI Dermatological ROS: negative for rash and skin lesion changes  Physical Examination: Blood pressure (!) 142/80, pulse 78, temperature 97.8 F (36.6 C), temperature source Oral, resp. rate 20, height 5' 7.5" (1.715 m), weight 107.7 kg (237 lb 8 oz), SpO2 98 %.  HEENT-  Normocephalic, no lesions, without obvious abnormality.  Normal external eye and conjunctiva.  Normal TM's bilaterally.  Normal auditory canals and external ears. Normal external nose, mucus membranes and septum.  Normal pharynx. Cardiovascular- S1, S2 normal, pulses palpable throughout   Lungs- chest clear, no wheezing, rales, normal symmetric air entry Abdomen- soft, non-tender; bowel sounds normal; no masses,  no organomegaly Extremities- Mild LE edema Lymph-no adenopathy palpable Musculoskeletal-left ankle pain, external rotation of the LLE Skin-warm and dry, no hyperpigmentation, vitiligo, or suspicious lesions  Neurological Examination Mental Status: Alert, oriented, thought content  appropriate.  Speech fluent without evidence of aphasia.  Able to follow 3 step commands without difficulty. Cranial Nerves: II: Discs flat bilaterally; Visual fields grossly normal, pupils equal, round, reactive to light and accommodation III,IV, VI: ptosis not present, extra-ocular motions intact bilaterally V,VII: smile symmetric, facial light touch sensation normal bilaterally VIII: hearing normal bilaterally IX,X: gag reflex present XI: bilateral shoulder shrug XII: midline tongue extension Motor: Right : Upper extremity   5/5    Left:     Upper extremity   5/5  Lower extremity   5/5     Lower extremity   5/5 Tone and bulk:normal tone throughout; no atrophy noted Sensory: Pinprick and light touch intact throughout, bilaterally Deep Tendon Reflexes: 2+ and symmetric throughout Plantars: Right: downgoing   Left: downgoing Cerebellar: normal finger-to-nose, normal rapid alternating movements and normal heel-to-shin test Gait: normal gait and station      Laboratory Studies:  Basic Metabolic Panel:  Recent Labs Lab 03/21/16 1247  NA 138  K 4.3  CL 104  CO2 27  GLUCOSE 205*  BUN 22*  CREATININE 1.42*  CALCIUM 9.2    Liver Function Tests:  Recent Labs Lab 03/21/16 1247  AST 31  ALT 23  ALKPHOS 100  BILITOT 0.6  PROT 7.4  ALBUMIN 3.9   No results for input(s): LIPASE, AMYLASE in the last 168 hours. No results for input(s): AMMONIA in the last 168 hours.  CBC:  Recent Labs Lab 03/21/16 1247  WBC 8.3  NEUTROABS 3.6  HGB 14.2  HCT 43.5  MCV 78.8*  PLT 238    Cardiac Enzymes:  Recent Labs Lab 03/21/16 1247  TROPONINI <0.03    BNP: Invalid input(s): POCBNP  CBG:  Recent Labs Lab 03/21/16 1247 03/21/16 1647 03/21/16 2102  GLUCAP 184* 141* 191*    Microbiology: Results for orders placed or performed in visit on 01/20/14  Urine culture     Status: None   Collection Time: 01/20/14  1:57 PM  Result Value Ref Range Status   Micro Text  Report   Final       SOURCE: CLEAN CATCH    ORGANISM 1                25,000 CFU/ML Escherichia coli   ANTIBIOTIC                    ORG#1     AMPICILLIN                    S         CEFAZOLIN                     S         CEFOXITIN                     S         CEFTRIAXONE                   S         CIPROFLOXACIN                 S         GENTAMICIN                    S         IMIPENEM                      S         LEVOFLOXACIN                  S         NITROFURANTOIN                S         Trimethoprim/Sulfamethoxazole S             Coagulation Studies:  Recent Labs  03/21/16 1247  LABPROT 12.8  INR 0.96    Urinalysis: No results for input(s): COLORURINE, LABSPEC, PHURINE, GLUCOSEU, HGBUR, BILIRUBINUR, KETONESUR, PROTEINUR, UROBILINOGEN, NITRITE, LEUKOCYTESUR in the last 168 hours.  Invalid input(s): APPERANCEUR  Lipid Panel: No results found for: CHOL, TRIG, HDL, CHOLHDL, VLDL, LDLCALC  HgbA1C: No results found for: HGBA1C  Urine Drug Screen:  No results found for: LABOPIA, COCAINSCRNUR, LABBENZ, AMPHETMU, THCU, LABBARB  Alcohol Level: No results for input(s): ETH in the last 168 hours.  Other results: EKG: sinus rhythm at 73 bpm.  Imaging: Mr Brain Wo Contrast  Result Date: 03/21/2016 CLINICAL DATA:  Expressive aphasia. EXAM: MRI HEAD WITHOUT CONTRAST TECHNIQUE: Multiplanar, multiecho pulse sequences of the brain  and surrounding structures were obtained without intravenous contrast. COMPARISON:  03/21/2016 head CT FINDINGS: Brain: There is a 4 mm acute subcortical white matter infarct in the high posterior right frontal lobe. There is moderate cerebellar and mild-to-moderate cerebral atrophy. Patchy to confluent cerebral white matter T2 hyperintensities are nonspecific but compatible with moderate chronic small vessel ischemic disease. There is no evidence of intracranial hemorrhage, midline shift, or extra-axial fluid collection. As described on earlier CT,  there is masslike enlargement of the pituitary infundibulum. This is incompletely evaluated on this routine noncontrast brain MRI, however the infundibulum measures 8 mm in maximal transverse diameter and is mildly T1 hyperintense. Vascular: Prior bilateral cataract extraction. Skull and upper cervical spine: Unremarkable bone marrow signal. Sinuses/Orbits: Prior bilateral cataract extraction. Paranasal sinuses and mastoid air cells are clear. Other: None. IMPRESSION: 1. 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 2. Moderate chronic small vessel ischemic disease and atrophy. 3. Nodular enlargement of the pituitary infundibulum, incompletely evaluated. Nonurgent outpatient contrast-enhanced pituitary protocol brain MRI is recommended for further characterization. Electronically Signed   By: Sebastian AcheAllen  Grady M.D.   On: 03/21/2016 15:45   Ct Head Code Stroke W/o Cm  Result Date: 03/21/2016 CLINICAL DATA:  Code stroke. Episode of not being able to talk. Posterior headache going to the neck. EXAM: CT HEAD WITHOUT CONTRAST TECHNIQUE: Contiguous axial images were obtained from the base of the skull through the vertex without intravenous contrast. COMPARISON:  None. FINDINGS: Brain: No evidence of acute infarction, hemorrhage, hydrocephalus, extra-axial collection or shift. Confluent low-density in the cerebral white matter consistent with chronic microvascular disease, extensive. There is atrophy, most notable in the cerebellum with fourth ventriculomegaly. 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 Metastasis, lymphoma, or granulomatous disease, or suprasellar proteinaceous cyst are primary differential considerations. Further workup recommended. Vascular: Atherosclerotic calcification. Skull: No acute or aggressive finding Sinuses/Orbits: No acute finding Other: These results were called by telephone at the time of interpretation on 03/21/2016 at 12:51  pm to Dr. Lenard LancePaduchowski , who verbally acknowledged these results. ASPECTS [facility]([place] Stroke Program Early CT Score) - Ganglionic level infarction (caudate, lentiform nuclei, internal capsule, insula, M1-M3 cortex): - Supraganglionic infarction (M4-M6 cortex): Total score (0-10 with 10 being normal): IMPRESSION: 1. No acute finding. ASPECTS is 10. 2. Advanced chronic microvascular disease. 3. 1 cm mass along the pituitary infundibulum as discussed above. Recommend pituitary protocol brain MRI. Electronically Signed   By: Marnee SpringJonathon  Watts M.D.   On: 03/21/2016 12:57    Assessment: 76 y.o. female who presents after recurrent episodes of aphasia.  Patient now at baseline.  On ASA and Plavix.  Concern is for TIA.  Further work up recommended.    Stroke Risk Factors - diabetes mellitus and hyperlipidemia  Plan: 1. HgbA1c, fasting lipid panel 2. MRI, MRA  of the brain without contrast 3. PT consult, OT consult, Speech consult 4. Echocardiogram 5. Carotid dopplers 6. Prophylactic therapy-Continue ASA and Plavix 7. NPO until RN stroke swallow screen 8. Telemetry monitoring 9. Frequent neuro checks   Thana FarrLeslie Narissa Beaufort, MD Neurology [phone] 03/21/2016, 10:43 PM

## 2016-03-21 NOTE — ED Notes (Signed)
MRI on phone with patient for screening questions.

## 2016-03-21 NOTE — ED Notes (Signed)
Patient transported to MRI 

## 2016-03-21 NOTE — ED Notes (Signed)
Admitting MD at bedside.

## 2016-03-21 NOTE — ED Notes (Signed)
Informed RN bed ready [phone]

## 2016-03-21 NOTE — Progress Notes (Signed)
CH responded to a PG for a Code Stroke. Pt was being examined by EDP on my arrival. Pt stated that she has an [employer]  Pt presented a bit anxious and stated it was from concern of her condition over the past couple of days (Slurred speech, inability to "find the right words" and discomfort in the back of her head)  and concern for the care of her mother. 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 CH provided a time of conversation, which seemed to ease the anxiety. The Pt stated that she felt at ease to relax. CH is available for follow up as needed.    03/21/16 1305  Clinical Encounter Type  Visited With Patient;Health care provider  Visit Type Initial;Spiritual support;Code (Code Stroke)  Referral From Nurse  Consult/Referral To Chaplain  Spiritual Encounters  Spiritual Needs Emotional  Stress Factors  Patient Stress Factors Health changes  Advance Directives (For Healthcare)  Does Patient Have a Medical Advance Directive? Yes  Type of Advance Directive Living will  Mental Health Advance Directives  Does Patient Have a Mental Health Advance Directive? No

## 2016-03-21 NOTE — ED Notes (Signed)
CODE STROKE CALLED TO [phone] 

## 2016-03-21 NOTE — ED Provider Notes (Signed)
[facility] Emergency Department Provider Note  Time seen: 1:38 PM  I have reviewed the triage vital signs and the nursing notes.   HISTORY  Chief Complaint Code Stroke    HPI Laura Hatfield is a 76 y.o. female with a past medical history of diabetes, hyperlipidemia, coronary artery disease, presents to the emergency department for difficulty speaking.According to the patient yesterday she was having difficult speaking which she describes as not being able to pronounce her words. Patient states he was frustrating for her. This lasted approximately 30 minutes and then resolved. Patient did not seek medical attention at that time. Patient states today around 11:00 this morning it happened once again or she is unable to articulate the words she was trying to say. Denies any headache. Focal weakness or numbness. Denies any history of CVA or TIA in the past. Patient states her symptoms have since completely resolved. Code stroke protocols were initiated in triage.  Past Medical History:  Diagnosis Date  . CAD (coronary artery disease)   . Diabetes mellitus without complication (HCC)   . Hyperlipidemia   . Thyroid nodule     There are no active problems to display for this patient.   Past Surgical History:  Procedure Laterality Date  . ANKLE SURGERY    . CHOLECYSTECTOMY    . EYE SURGERY    . HUMERUS SURGERY      Prior to Admission medications   Medication Sig Start Date End Date Taking? Authorizing Provider  aspirin EC 81 MG tablet Take 81 mg by mouth daily.   Yes Historical Provider, MD  clopidogrel (PLAVIX) 75 MG tablet Take 75 mg by mouth daily.   Yes Historical Provider, MD  cyanocobalamin 2000 MCG tablet Take 2,000 mcg by mouth daily.   Yes Historical Provider, MD  Dulaglutide (TRULICITY) 0.75 MG/0.5ML SOPN Inject 0.75 mg into the skin once a week.   Yes Historical Provider, MD  furosemide (LASIX) 40 MG tablet Take 1-1.5 tablets by mouth daily as  needed. 01/04/15  Yes Historical Provider, MD  insulin NPH-regular Human (NOVOLIN 70/30) (70-30) 100 UNIT/ML injection Inject into the skin.   Yes Historical Provider, MD  METOPROLOL SUCCINATE ER PO Take 1 tablet by mouth daily. Need to call pharmacy to verify dosage   Yes Historical Provider, MD  naproxen sodium (ANAPROX) 220 MG tablet Take 220 mg by mouth 2 (two) times daily with a meal.   Yes Historical Provider, MD  nitroGLYCERIN (NITROSTAT) 0.4 MG SL tablet Place 1 tablet under the tongue every 5 (five) minutes as needed for chest pain. 03/30/14  Yes Historical Provider, MD  rosuvastatin (CRESTOR) 40 MG tablet Take 1 tablet by mouth daily. 09/22/15  Yes Historical Provider, MD    Allergies  Allergen Reactions  . Atorvastatin Other (See Comments)    Dr told her taking these were affecting her liver  . Lisinopril Cough  . Penicillins Swelling    Has patient had a PCN reaction causing immediate rash, facial/tongue/throat swelling, SOB or lightheadedness with hypotension: yes Has patient had a PCN reaction causing severe rash involving mucus membranes or skin necrosis: no Has patient had a PCN reaction that required hospitalization no Has patient had a PCN reaction occurring within the last 10 years: no If all of the above answers are "NO", then may proceed with Cephalosporin use.     No family history on file.  Social History Social History  Substance Use Topics  . Smoking status: Never Smoker  .  Smokeless tobacco: Never Used  . Alcohol use No    Review of Systems Constitutional: Negative for fever. Cardiovascular: Negative for chest pain. Respiratory: Negative for shortness of breath. Gastrointestinal: Negative for abdominal pain Neurological: Negative for headaches, focal weakness or numbness. 10-point ROS otherwise negative.  ____________________________________________   PHYSICAL EXAM:  VITAL SIGNS: ED Triage Vitals  Enc Vitals Group     BP 03/21/16 1234 (!) 142/73      Pulse Rate 03/21/16 1234 75     Resp 03/21/16 1234 18     Temp 03/21/16 1234 98.8 F (37.1 C)     Temp Source 03/21/16 1234 Oral     SpO2 03/21/16 1234 100 %     Weight 03/21/16 1234 237 lb (107.5 kg)     Height 03/21/16 1234 5\' 7"  (1.702 m)     Head Circumference --      Peak Flow --      Pain Score 03/21/16 1300 6     Pain Loc --      Pain Edu? --      Excl. in GC? --     Constitutional: Alert and oriented. Well appearing and in no distress. Eyes: Normal exam ENT   Head: Normocephalic and atraumatic.   Mouth/Throat: Mucous membranes are moist. Cardiovascular: Normal rate, regular rhythm. No murmur Respiratory: Normal respiratory effort without tachypnea nor retractions. Breath sounds are clear  Gastrointestinal: Soft and nontender. No distention. Musculoskeletal: Nontender with normal range of motion in all extremities. Neurologic:  Normal speech and language. No gross focal neurologic deficits. 5/5 motor in all extremities. No sensory deficit in any extremity. No pronator drift. No lower extremity drift. Cranial nerves intact. Skin:  Skin is warm, dry and intact.  Psychiatric: Mood and affect are normal. Speech and behavior are normal.   ____________________________________________    EKG  EKG reviewed and interpreted by myself shows normal sinus rhythm at 73 bpm. Narrow QRS, normal axis, normal intervals, no ST changes. Normal EKG.  ____________________________________________    RADIOLOGY  CT head shows a small pituitary mass otherwise negative or infarct.  ____________________________________________   INITIAL IMPRESSION / ASSESSMENT AND PLAN / ED COURSE  Pertinent labs & imaging results that were available during my care of the patient were reviewed by me and considered in my medical decision making (see chart for details).  Patient presents to the emergency department symptoms concerning for TIA. Patient's symptoms have completely resolved at this  time. Normal physical and neurological examination. EKG is reassuring. CT scan does show a small pituitary stalk mass which needs further workup with MRI. No CVA noted on CT. Patient takes aspirin and Plavix daily. Given the patient's significant symptoms even though resolved patient will be admitted to the hospital for CVA workup for TIA.  ____________________________________________   FINAL CLINICAL IMPRESSION(S) / ED DIAGNOSES  Transient ischemic attack    Minna AntisKevin Khy Pitre, MD 03/21/16 1350

## 2016-03-21 NOTE — ED Notes (Signed)
Called floor to check on status of bed 1539

## 2016-03-21 NOTE — ED Triage Notes (Signed)
States yesterday patient was not feeling well and was trying to talk and "nothing made sense".  Symptoms resolved and then this morning patient was trying to talk to her mother's social worker and patient describes difficulty "getting words out".  States this episode occurred at around 1100.  Patient states she sat down on sofa and symptoms resolved.  Also c/o pain to back of head that started today.

## 2016-03-21 NOTE — ED Triage Notes (Signed)
Pt comes into the ED via POV c/o problems with aphasia.  Patient had an episode yesterday where she had a hard time "finding my words" but the issue resolved.  Patient woke up with no difficulties and then started having issues with her speech again around 11:00.  Patient states he speech is slurred, but she had a hard time finding the correct words.  Patient has no speech issues at this time and all symptoms seem to have resolved at this time.  Patient ambulatory to ED stretcher.

## 2016-03-21 NOTE — ED Notes (Signed)
Patient able to ambulate to the restroom independently with no difficulty.

## 2016-03-22 ENCOUNTER — Observation Stay: Payer: Medicare HMO

## 2016-03-22 DIAGNOSIS — I639 Cerebral infarction, unspecified: Secondary | ICD-10-CM

## 2016-03-22 LAB — LIPID PANEL
Cholesterol: 90 mg/dL (ref 0–200)
HDL: 38 mg/dL — AB (ref >40)
LDL CALC: 34 mg/dL (ref 0–99)
Total CHOL/HDL Ratio: 2.4 RATIO
Triglycerides: 91 mg/dL (ref <150)
VLDL: 18 mg/dL (ref 0–40)

## 2016-03-22 LAB — GLUCOSE, CAPILLARY
Glucose-Capillary: 102 mg/dL — ABNORMAL HIGH (ref 65–99)
Glucose-Capillary: 190 mg/dL — ABNORMAL HIGH (ref 65–99)

## 2016-03-22 NOTE — Care Management Obs Status (Signed)
MEDICARE OBSERVATION STATUS NOTIFICATION   Patient Details  Name: Laura ShoemakerShirley Jean Pardi MRN: [id number] Date of Birth: 1940-11-23   Medicare Observation Status Notification Given:  No (discharge order in less than 24 hours)    Caren MacadamMichelle Huntington Leverich, RN 03/22/2016, 12:14 PM

## 2016-03-22 NOTE — Progress Notes (Signed)
Patient is d/ced home.  She was supposed to have an ECHO today, but tech left before it was done.  Patient will see cardiologist outpt and will have ECHO outpt.  MRI was + for R frontal lobe white matter infarct.  Patient had symptoms of headache and aphasia.  Headache has resolved but pt may still be experiencing some aphasia.  She speaks very slowly and distinctly and takes her time.  NIHSS = 0.  Patient's IV was removed accidentally by pt.  Will go home with her cousin who is here to pick her up. D/c Instructions will be reviewed.

## 2016-03-22 NOTE — Progress Notes (Signed)
Subjective: Patient with no further problems with speech  Objective: Current vital signs: BP (!) 120/56   Pulse 71   Temp 98.6 F (37 C) (Oral)   Resp 19   Ht 5' 7.5" (1.715 m)   Wt 107.7 kg (237 lb 8 oz)   SpO2 100%   BMI 36.65 kg/m  Vital signs in last 24 hours: Temp:  [97.4 F (36.3 C)-98.6 F (37 C)] 98.6 F (37 C) (01/27 1425) Pulse Rate:  [59-82] 71 (01/27 1425) Resp:  [18-20] 19 (01/27 1425) BP: (106-142)/(49-80) 120/56 (01/27 1425) SpO2:  [95 %-100 %] 100 % (01/27 1425)  Intake/Output from previous day: 01/26 0701 - 01/27 0700 In: 240 [P.O.:240] Out: -  Intake/Output this shift: Total I/O In: 240 [P.O.:240] Out: -  Nutritional status: Diet Carb Modified Fluid consistency: Thin; Room service appropriate? Yes  Neurologic Exam: Mental Status: Alert, oriented, thought content appropriate.  Speech fluent without evidence of aphasia.  Able to follow 3 step commands without difficulty. Cranial Nerves: II: Discs flat bilaterally; Visual fields grossly normal, pupils equal, round, reactive to light and accommodation III,IV, VI: ptosis not present, extra-ocular motions intact bilaterally V,VII: smile symmetric, facial light touch sensation normal bilaterally VIII: hearing normal bilaterally IX,X: gag reflex present XI: bilateral shoulder shrug XII: midline tongue extension Motor: Right :  Upper extremity   5/5                                      Left:     Upper extremity   5/5             Lower extremity   5/5                                                  Lower extremity   5/5   Lab Results: Basic Metabolic Panel:  Recent Labs Lab 03/21/16 1247  NA 138  K 4.3  CL 104  CO2 27  GLUCOSE 205*  BUN 22*  CREATININE 1.42*  CALCIUM 9.2    Liver Function Tests:  Recent Labs Lab 03/21/16 1247  AST 31  ALT 23  ALKPHOS 100  BILITOT 0.6  PROT 7.4  ALBUMIN 3.9   No results for input(s): LIPASE, AMYLASE in the last 168 hours. No results for input(s):  AMMONIA in the last 168 hours.  CBC:  Recent Labs Lab 03/21/16 1247  WBC 8.3  NEUTROABS 3.6  HGB 14.2  HCT 43.5  MCV 78.8*  PLT 238    Cardiac Enzymes:  Recent Labs Lab 03/21/16 1247  TROPONINI <0.03    Lipid Panel:  Recent Labs Lab 03/22/16 0922  CHOL 90  TRIG 91  HDL 38*  CHOLHDL 2.4  VLDL 18  LDLCALC 34    CBG:  Recent Labs Lab 03/21/16 1247 03/21/16 1647 03/21/16 2102 03/22/16 0715 03/22/16 1128  GLUCAP 184* 141* 191* 102* 190*    Microbiology: Results for orders placed or performed in visit on 01/20/14  Urine culture     Status: None   Collection Time: 01/20/14  1:57 PM  Result Value Ref Range Status   Micro Text Report   Final       SOURCE: CLEAN CATCH    ORGANISM 1  25,000 CFU/ML Escherichia coli   ANTIBIOTIC                    ORG#1     AMPICILLIN                    S         CEFAZOLIN                     S         CEFOXITIN                     S         CEFTRIAXONE                   S         CIPROFLOXACIN                 S         GENTAMICIN                    S         IMIPENEM                      S         LEVOFLOXACIN                  S         NITROFURANTOIN                S         Trimethoprim/Sulfamethoxazole S             Coagulation Studies:  Recent Labs  03/21/16 1247  LABPROT 12.8  INR 0.96    Imaging: Mr Brain Wo Contrast  Result Date: 03/21/2016 CLINICAL DATA:  Expressive aphasia. EXAM: MRI HEAD WITHOUT CONTRAST TECHNIQUE: Multiplanar, multiecho pulse sequences of the brain and surrounding structures were obtained without intravenous contrast. COMPARISON:  03/21/2016 head CT FINDINGS: Brain: There is a 4 mm acute subcortical white matter infarct in the high posterior right frontal lobe. There is moderate cerebellar and mild-to-moderate cerebral atrophy. Patchy to confluent cerebral white matter T2 hyperintensities are nonspecific but compatible with moderate chronic small vessel ischemic  disease. There is no evidence of intracranial hemorrhage, midline shift, or extra-axial fluid collection. As described on earlier CT, there is masslike enlargement of the pituitary infundibulum. This is incompletely evaluated on this routine noncontrast brain MRI, however the infundibulum measures 8 mm in maximal transverse diameter and is mildly T1 hyperintense. Vascular: Prior bilateral cataract extraction. Skull and upper cervical spine: Unremarkable bone marrow signal. Sinuses/Orbits: Prior bilateral cataract extraction. Paranasal sinuses and mastoid air cells are clear. Other: None. IMPRESSION: 1. 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 2. Moderate chronic small vessel ischemic disease and atrophy. 3. Nodular enlargement of the pituitary infundibulum, incompletely evaluated. Nonurgent outpatient contrast-enhanced pituitary protocol brain MRI is recommended for further characterization. Electronically Signed   By: Sebastian Ache M.D.   On: 03/21/2016 15:45   US Carotid Bilateral  Result Date: 03/22/2016 CLINICAL DATA:  Expressive aphasia. EXAM: BILATERAL CAROTID DUPLEX ULTRASOUND TECHNIQUE: Wallace Cullens scale imaging, color Doppler and duplex ultrasound were performed of bilateral carotid and vertebral arteries in the neck. COMPARISON:  None. FINDINGS: Criteria: Quantification of carotid stenosis is based on velocity parameters that correlate the residual internal carotid diameter  with NASCET-based stenosis levels, using the diameter of the distal internal carotid lumen as the denominator for stenosis measurement. The following velocity measurements were obtained: RIGHT ICA:  72 cm/sec CCA:  68 cm/sec SYSTOLIC ICA/CCA RATIO:  1.1 DIASTOLIC ICA/CCA RATIO:  1.7 ECA:  86 cm/sec LEFT ICA:  41 cm/sec CCA:  85 cm/sec SYSTOLIC ICA/CCA RATIO:  0.5 DIASTOLIC ICA/CCA RATIO:  0.5 ECA:  82 cm/sec RIGHT CAROTID ARTERY: Echogenic thrombus at the right carotid bulb. External carotid artery is patent with  normal waveform. Normal waveforms and velocities in the internal carotid artery. RIGHT VERTEBRAL ARTERY: Antegrade flow and normal waveform in the right vertebral artery. LEFT CAROTID ARTERY: No significant plaque or stenosis in the left carotid arteries. External carotid artery is patent with normal waveform. Normal waveforms and velocities in the internal carotid artery. LEFT VERTEBRAL ARTERY: Antegrade flow and normal waveform in the left vertebral artery. IMPRESSION: Atherosclerotic plaque at the right carotid bulb. Estimated degree of stenosis in the right internal carotid artery is less than 50%. No significant plaque or stenosis in left carotid arteries. Patent vertebral arteries. Electronically Signed   By: Richarda OverlieAdam  Henn M.D.   On: 03/22/2016 09:33   Ct Head Code Stroke W/o Cm  Result Date: 03/21/2016 CLINICAL DATA:  Code stroke. Episode of not being able to talk. Posterior headache going to the neck. EXAM: CT HEAD WITHOUT CONTRAST TECHNIQUE: Contiguous axial images were obtained from the base of the skull through the vertex without intravenous contrast. COMPARISON:  None. FINDINGS: Brain: No evidence of acute infarction, hemorrhage, hydrocephalus, extra-axial collection or shift. Confluent low-density in the cerebral white matter consistent with chronic microvascular disease, extensive. There is atrophy, most notable in the cerebellum with fourth ventriculomegaly. 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 Metastasis, lymphoma, or granulomatous disease, or suprasellar proteinaceous cyst are primary differential considerations. Further workup recommended. Vascular: Atherosclerotic calcification. Skull: No acute or aggressive finding Sinuses/Orbits: No acute finding Other: These results were called by telephone at the time of interpretation on 03/21/2016 at 12:51 pm to Dr. Lenard LancePaduchowski , who verbally acknowledged these results. ASPECTS [facility]([place] Stroke Program Early  CT Score) - Ganglionic level infarction (caudate, lentiform nuclei, internal capsule, insula, M1-M3 cortex): - Supraganglionic infarction (M4-M6 cortex): Total score (0-10 with 10 being normal): IMPRESSION: 1. No acute finding. ASPECTS is 10. 2. Advanced chronic microvascular disease. 3. 1 cm mass along the pituitary infundibulum as discussed above. Recommend pituitary protocol brain MRI. Electronically Signed   By: Marnee SpringJonathon  Watts M.D.   On: 03/21/2016 12:57    Medications: I have reviewed the patient's current medications.  Assessment/Plan: Patient at baseline.  MRi reviewed and shows an acute small right parietal lobe infarct.  Patient on ASA and Plavix.  Carotid dopplers show no evidence of hemodynamically significant stenosis.  Echocardiogram pending.  A1c pending, LDL 34.  Recommendations: 1.  Continue ASA and Plavix 2.  If stroke work up unremarkable would have patient follow up with cardiology on an outpatient basis for consideration of holter?LINQ monitoring.   LOS: 0 days   Thana FarrLeslie Burtis Imhoff, MD Neurology [phone] 03/22/2016  5:20 PM

## 2016-03-22 NOTE — Discharge Summary (Addendum)
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 CAD (coronary artery disease)   . Diabetes mellitus without complication (HCC)   . Hyperlipidemia   . Thyroid nodule     HOSPITAL COURSE:  76 y.o. female who presents after recurrent episodes of aphasia  1. Subcentimeter acute white matter infarct in the right frontal lobe: Patient was evaluated by neurology while in the emergency department. She was not a candidate for TPA as her symptoms had resolved while in the emergency room. MRI does show acute CVA. She underwent carotid Dopplers which showed no hemodynamically significant stenosis. She reports a recent ECHO and cardiac workup so we have deferred this to her cardiologist.  Due to the nature of this CVA it is recommended by the neurologist that patient have a outpatient telemetry monitor. This can be arranged by her cardiologist Dr. Okey Dupre at [facility] and was discussed with the patient.     2. Hyperlipidemia: Continue statin  3. Essential hypertension: Continue Metoprolol 4. Diabetes: Patient will continue outpatient regimen.   DISCHARGE CONDITIONS AND DIET:   Stable  Diabetic diet  CONSULTS OBTAINED:  Treatment Team:  Kym Groom, MD  DRUG ALLERGIES:   Allergies  Allergen Reactions  . Atorvastatin Other (See Comments)    Dr told her taking these were affecting her liver  . Lisinopril Cough  . Penicillins Swelling    Has patient had a PCN reaction causing  immediate rash, facial/tongue/throat swelling, SOB or lightheadedness with hypotension: yes Has patient had a PCN reaction causing severe rash involving mucus membranes or skin necrosis: no Has patient had a PCN reaction that required hospitalization no Has patient had a PCN reaction occurring within the last 10 years: no If all of the above answers are "NO", then may proceed with Cephalosporin use.     DISCHARGE MEDICATIONS:   Current Discharge Medication List    CONTINUE these medications which have NOT CHANGED   Details  aspirin EC 81 MG tablet Take 81 mg by mouth daily.    clopidogrel (PLAVIX) 75 MG tablet Take 75 mg by mouth daily.    cyanocobalamin 2000 MCG tablet Take 2,000 mcg by mouth daily.    Dulaglutide (TRULICITY) 0.75 MG/0.5ML SOPN Inject 0.75 mg into the skin once a week.    furosemide (LASIX) 40 MG tablet Take 1-1.5 tablets by mouth daily as needed.    insulin NPH-regular Human (NOVOLIN 70/30) (70-30) 100 UNIT/ML injection Inject 30-45 Units into the skin 2 (two) times daily. Injects 30 units during the day and 45 qhs    metoprolol succinate (TOPROL-XL) 100 MG 24 hr tablet Take 1.5 tablets by mouth daily. Need to call pharmacy to verify dosage     nitroGLYCERIN (NITROSTAT) 0.4 MG SL tablet Place 1 tablet under the tongue every 5 (five) minutes as needed for chest pain.    rosuvastatin (CRESTOR) 40 MG tablet Take 1 tablet by mouth daily.      STOP taking these medications     naproxen sodium (ANAPROX) 220 MG  tablet               Today   CHIEF COMPLAINT:  Patient's symptoms doing well this am. Aphasia resolved.    VITAL SIGNS:  Blood pressure 116/74, pulse (!) 59, temperature 98 F (36.7 C), temperature source Oral, resp. rate 20, height 5' 7.5" (1.715 m), weight 107.7 kg (237 lb 8 oz), SpO2 96 %.   REVIEW OF SYSTEMS:  Review of Systems  Constitutional: Negative.  Negative for chills, fever and malaise/fatigue.  HENT: Negative.  Negative  for ear discharge, ear pain, hearing loss, nosebleeds and sore throat.   Eyes: Negative.  Negative for blurred vision and pain.  Respiratory: Negative.  Negative for cough, hemoptysis, shortness of breath and wheezing.   Cardiovascular: Negative.  Negative for chest pain, palpitations and leg swelling.  Gastrointestinal: Negative.  Negative for abdominal pain, blood in stool, diarrhea, nausea and vomiting.  Genitourinary: Negative.  Negative for dysuria.  Musculoskeletal: Negative.  Negative for back pain.  Skin: Negative.   Neurological: Negative for dizziness, tremors, speech change, focal weakness, seizures and headaches.  Endo/Heme/Allergies: Negative.  Does not bruise/bleed easily.  Psychiatric/Behavioral: Negative.  Negative for depression, hallucinations and suicidal ideas.     PHYSICAL EXAMINATION:  GENERAL:  76 y.o.-year-old patient lying in the bed with no acute distress.  NECK:  Supple, no jugular venous distention. No thyroid enlargement, no tenderness.  LUNGS: Normal breath sounds bilaterally, no wheezing, rales,rhonchi  No use of accessory muscles of respiration.  CARDIOVASCULAR: S1, S2 normal. No murmurs, rubs, or gallops.  ABDOMEN: Soft, non-tender, non-distended. Bowel sounds present. No organomegaly or mass.  EXTREMITIES: No pedal edema, cyanosis, or clubbing.  PSYCHIATRIC: The patient is alert and oriented x 3.  SKIN: No obvious rash, lesion, or ulcer.   DATA REVIEW:   CBC  Recent Labs Lab 03/21/16 1247  WBC 8.3  HGB 14.2  HCT 43.5  PLT 238    Chemistries   Recent Labs Lab 03/21/16 1247  NA 138  K 4.3  CL 104  CO2 27  GLUCOSE 205*  BUN 22*  CREATININE 1.42*  CALCIUM 9.2  AST 31  ALT 23  ALKPHOS 100  BILITOT 0.6    Cardiac Enzymes  Recent Labs Lab 03/21/16 1247  TROPONINI <0.03    Microbiology Results  @[email or web address] @  RADIOLOGY:  Mr Brain Wo Contrast  Result Date: 03/21/2016 CLINICAL DATA:  Expressive aphasia. EXAM: MRI HEAD  WITHOUT CONTRAST TECHNIQUE: Multiplanar, multiecho pulse sequences of the brain and surrounding structures were obtained without intravenous contrast. COMPARISON:  03/21/2016 head CT FINDINGS: Brain: There is a 4 mm acute subcortical white matter infarct in the high posterior right frontal lobe. There is moderate cerebellar and mild-to-moderate cerebral atrophy. Patchy to confluent cerebral white matter T2 hyperintensities are nonspecific but compatible with moderate chronic small vessel ischemic disease. There is no evidence of intracranial hemorrhage, midline shift, or extra-axial fluid collection. As described on earlier CT, there is masslike enlargement of the pituitary infundibulum. This is incompletely evaluated on this routine noncontrast brain MRI, however the infundibulum measures 8 mm in maximal transverse diameter and is mildly T1 hyperintense. Vascular: Prior bilateral cataract extraction. Skull and upper cervical spine: Unremarkable bone marrow signal. Sinuses/Orbits: Prior bilateral cataract extraction. Paranasal sinuses and mastoid air cells are clear. Other: None. IMPRESSION: 1. 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 2. Moderate chronic small vessel ischemic disease and atrophy. 3. Nodular enlargement of the pituitary infundibulum, incompletely evaluated. Nonurgent outpatient contrast-enhanced pituitary  protocol brain MRI is recommended for further characterization. Electronically Signed   By: Sebastian Ache M.D.   On: 03/21/2016 15:45   US Carotid Bilateral  Result Date: 03/22/2016 CLINICAL DATA:  Expressive aphasia. EXAM: BILATERAL CAROTID DUPLEX ULTRASOUND TECHNIQUE: Wallace Cullens scale imaging, color Doppler and duplex ultrasound were performed of bilateral carotid and vertebral arteries in the neck. COMPARISON:  None. FINDINGS: Criteria: Quantification of carotid stenosis is based on velocity parameters that correlate the residual internal carotid diameter with NASCET-based  stenosis levels, using the diameter of the distal internal carotid lumen as the denominator for stenosis measurement. The following velocity measurements were obtained: RIGHT ICA:  72 cm/sec CCA:  68 cm/sec SYSTOLIC ICA/CCA RATIO:  1.1 DIASTOLIC ICA/CCA RATIO:  1.7 ECA:  86 cm/sec LEFT ICA:  41 cm/sec CCA:  85 cm/sec SYSTOLIC ICA/CCA RATIO:  0.5 DIASTOLIC ICA/CCA RATIO:  0.5 ECA:  82 cm/sec RIGHT CAROTID ARTERY: Echogenic thrombus at the right carotid bulb. External carotid artery is patent with normal waveform. Normal waveforms and velocities in the internal carotid artery. RIGHT VERTEBRAL ARTERY: Antegrade flow and normal waveform in the right vertebral artery. LEFT CAROTID ARTERY: No significant plaque or stenosis in the left carotid arteries. External carotid artery is patent with normal waveform. Normal waveforms and velocities in the internal carotid artery. LEFT VERTEBRAL ARTERY: Antegrade flow and normal waveform in the left vertebral artery. IMPRESSION: Atherosclerotic plaque at the right carotid bulb. Estimated degree of stenosis in the right internal carotid artery is less than 50%. No significant plaque or stenosis in left carotid arteries. Patent vertebral arteries. Electronically Signed   By: Richarda Overlie M.D.   On: 03/22/2016 09:33   Ct Head Code Stroke W/o Cm  Result Date: 03/21/2016 CLINICAL DATA:  Code stroke. Episode of not being able to talk. Posterior headache going to the neck. EXAM: CT HEAD WITHOUT CONTRAST TECHNIQUE: Contiguous axial images were obtained from the base of the skull through the vertex without intravenous contrast. COMPARISON:  None. FINDINGS: Brain: No evidence of acute infarction, hemorrhage, hydrocephalus, extra-axial collection or shift. Confluent low-density in the cerebral white matter consistent with chronic microvascular disease, extensive. There is atrophy, most notable in the cerebellum with fourth ventriculomegaly. Ovoid high-density mass following the pituitary  stalk, 1 cm in craniocaudal dimension. Metastasis, lymphoma, or granulomatous disease, or suprasellar proteinaceous cyst are primary differential considerations. Further workup recommended. Vascular: Atherosclerotic calcification. Skull: No acute or aggressive finding Sinuses/Orbits: No acute finding Other: These results were called by telephone at the time of interpretation on 03/21/2016 at 12:51 pm to Dr. Lenard Lance , who verbally acknowledged these results. ASPECTS [facility] Stroke Program Early CT Score) - Ganglionic level infarction (caudate, lentiform nuclei, internal capsule, insula, M1-M3 cortex): - Supraganglionic infarction (M4-M6 cortex): Total score (0-10 with 10 being normal): IMPRESSION: 1. No acute finding. ASPECTS is 10. 2. Advanced chronic microvascular disease. 3. 1 cm mass along the pituitary infundibulum as discussed above. Recommend pituitary protocol brain MRI. Electronically Signed   By: Marnee Spring M.D.   On: 03/21/2016 12:57      Management plans discussed with the patient and she is in agreement. Stable for discharge home  Patient should follow up with pcp and cardiology  CODE STATUS:     Code Status Orders        Start     Ordered   03/21/16 1418  Full code  Continuous     03/21/16 1418    Code Status History    Date Active Date Inactive  Code Status Order ID Comments User Context   This patient has a current code status but no historical code status.    Advance Directive Documentation   Flowsheet Row Most Recent Value  Type of Advance Directive  Healthcare Power of Attorney, Living will  Pre-existing out of facility DNR order (yellow form or pink MOST form)  No data  "MOST" Form in Place?  No data      TOTAL TIME TAKING CARE OF THIS PATIENT: 35 minutes.    Note: This dictation was prepared with Dragon dictation along with smaller phrase technology. Any transcriptional errors that result from this process are unintentional.  Dinari Stgermaine M.D on  03/22/2016 at 9:56 AM  Between 7am to 6pm - Pager - [phone] After 6pm go to [email or web address] - Social research officer, government  [facility]  Office  [phone]  CC: Primary care physician; [facility]

## 2016-03-23 LAB — HEMOGLOBIN A1C
HEMOGLOBIN A1C: 8 % — AB (ref 4.8–5.6)
Mean Plasma Glucose: 183 mg/dL

## 2017-05-01 ENCOUNTER — Encounter: Payer: Self-pay | Admitting: Internal Medicine

## 2017-05-01 ENCOUNTER — Other Ambulatory Visit: Payer: Self-pay

## 2017-05-01 ENCOUNTER — Emergency Department: Payer: Medicare HMO

## 2017-05-01 ENCOUNTER — Observation Stay
Admission: EM | Admit: 2017-05-01 | Discharge: 2017-05-02 | Disposition: A | Discharge status: Home / Self Care | Payer: Medicare HMO | Attending: Internal Medicine | Admitting: Internal Medicine

## 2017-05-01 DIAGNOSIS — Z8673 Personal history of transient ischemic attack (TIA), and cerebral infarction without residual deficits: Secondary | ICD-10-CM | POA: Insufficient documentation

## 2017-05-01 DIAGNOSIS — E785 Hyperlipidemia, unspecified: Secondary | ICD-10-CM | POA: Diagnosis not present

## 2017-05-01 DIAGNOSIS — N183 Chronic kidney disease, stage 3 unspecified: Secondary | ICD-10-CM | POA: Diagnosis present

## 2017-05-01 DIAGNOSIS — Z823 Family history of stroke: Secondary | ICD-10-CM | POA: Diagnosis not present

## 2017-05-01 DIAGNOSIS — Z955 Presence of coronary angioplasty implant and graft: Secondary | ICD-10-CM | POA: Insufficient documentation

## 2017-05-01 DIAGNOSIS — Z888 Allergy status to other drugs, medicaments and biological substances status: Secondary | ICD-10-CM | POA: Insufficient documentation

## 2017-05-01 DIAGNOSIS — Z880 Allergy status to penicillin: Secondary | ICD-10-CM | POA: Diagnosis not present

## 2017-05-01 DIAGNOSIS — I081 Rheumatic disorders of both mitral and tricuspid valves: Secondary | ICD-10-CM | POA: Diagnosis not present

## 2017-05-01 DIAGNOSIS — Z833 Family history of diabetes mellitus: Secondary | ICD-10-CM | POA: Insufficient documentation

## 2017-05-01 DIAGNOSIS — Z7982 Long term (current) use of aspirin: Secondary | ICD-10-CM | POA: Diagnosis not present

## 2017-05-01 DIAGNOSIS — R51 Headache: Secondary | ICD-10-CM | POA: Diagnosis present

## 2017-05-01 DIAGNOSIS — Z9049 Acquired absence of other specified parts of digestive tract: Secondary | ICD-10-CM | POA: Insufficient documentation

## 2017-05-01 DIAGNOSIS — Z7902 Long term (current) use of antithrombotics/antiplatelets: Secondary | ICD-10-CM | POA: Diagnosis not present

## 2017-05-01 DIAGNOSIS — N1832 Chronic kidney disease, stage 3b: Secondary | ICD-10-CM | POA: Diagnosis present

## 2017-05-01 DIAGNOSIS — Z8249 Family history of ischemic heart disease and other diseases of the circulatory system: Secondary | ICD-10-CM | POA: Diagnosis not present

## 2017-05-01 DIAGNOSIS — R519 Headache, unspecified: Secondary | ICD-10-CM

## 2017-05-01 DIAGNOSIS — Z79899 Other long term (current) drug therapy: Secondary | ICD-10-CM | POA: Diagnosis not present

## 2017-05-01 DIAGNOSIS — Z794 Long term (current) use of insulin: Secondary | ICD-10-CM | POA: Diagnosis not present

## 2017-05-01 DIAGNOSIS — G459 Transient cerebral ischemic attack, unspecified: Secondary | ICD-10-CM | POA: Diagnosis not present

## 2017-05-01 DIAGNOSIS — R4701 Aphasia: Secondary | ICD-10-CM | POA: Diagnosis present

## 2017-05-01 DIAGNOSIS — E1122 Type 2 diabetes mellitus with diabetic chronic kidney disease: Secondary | ICD-10-CM | POA: Insufficient documentation

## 2017-05-01 DIAGNOSIS — I129 Hypertensive chronic kidney disease with stage 1 through stage 4 chronic kidney disease, or unspecified chronic kidney disease: Secondary | ICD-10-CM | POA: Insufficient documentation

## 2017-05-01 DIAGNOSIS — E119 Type 2 diabetes mellitus without complications: Secondary | ICD-10-CM

## 2017-05-01 DIAGNOSIS — I2510 Atherosclerotic heart disease of native coronary artery without angina pectoris: Secondary | ICD-10-CM | POA: Diagnosis present

## 2017-05-01 LAB — DIFFERENTIAL
Basophils Absolute: 0.1 K/uL (ref 0–0.1)
Basophils Relative: 1 %
Eosinophils Absolute: 0.4 K/uL (ref 0–0.7)
Eosinophils Relative: 5 %
Lymphocytes Relative: 51 %
Lymphs Abs: 3.8 K/uL — ABNORMAL HIGH (ref 1.0–3.6)
Monocytes Absolute: 0.6 K/uL (ref 0.2–0.9)
Monocytes Relative: 8 %
Neutro Abs: 2.7 K/uL (ref 1.4–6.5)
Neutrophils Relative %: 35 %

## 2017-05-01 LAB — GLUCOSE, CAPILLARY: GLUCOSE-CAPILLARY: 204 mg/dL — AB (ref 65–99)

## 2017-05-01 LAB — CBC
HCT: 42 % (ref 35.0–47.0)
HEMOGLOBIN: 13.7 g/dL (ref 12.0–16.0)
MCH: 25.8 pg — ABNORMAL LOW (ref 26.0–34.0)
MCHC: 32.6 g/dL (ref 32.0–36.0)
MCV: 79.2 fL — ABNORMAL LOW (ref 80.0–100.0)
PLATELETS: 225 10*3/uL (ref 150–440)
RBC: 5.31 MIL/uL — ABNORMAL HIGH (ref 3.80–5.20)
RDW: 16.1 % — AB (ref 11.5–14.5)
WBC: 7.6 10*3/uL (ref 3.6–11.0)

## 2017-05-01 LAB — TROPONIN I

## 2017-05-01 MED ORDER — ASPIRIN 81 MG PO CHEW
324.0000 mg | CHEWABLE_TABLET | Freq: Once | ORAL | Status: AC
  Administered 2017-05-01: 324 mg via ORAL
  Filled 2017-05-01: qty 4

## 2017-05-01 MED ORDER — ONDANSETRON HCL 4 MG/2ML IJ SOLN
4.0000 mg | Freq: Once | INTRAMUSCULAR | Status: AC
  Administered 2017-05-01: 4 mg via INTRAVENOUS
  Filled 2017-05-01: qty 2

## 2017-05-01 MED ORDER — MORPHINE SULFATE (PF) 4 MG/ML IV SOLN
4.0000 mg | Freq: Once | INTRAVENOUS | Status: AC
  Administered 2017-05-01: 4 mg via INTRAVENOUS
  Filled 2017-05-01: qty 1

## 2017-05-01 NOTE — ED Provider Notes (Signed)
[facility] Emergency Department Provider Note   ____________________________________________   First MD Initiated Contact with Patient 05/01/17 2300     (approximate)  I have reviewed the triage vital signs and the nursing notes.   HISTORY  Chief Complaint Code Stroke    HPI Laura Hatfield is a 77 y.o. female who presents to the ED from home with a chief complaint of headache and aphagia.  Last well-known state was 6 PM when patient laid down to take a nap.  She was awakened at 7:30 PM by a phone call.  Noted she was aphasic and having difficulty forming words.  She hung up her phone call approximately 8 PM and went back to sleep.  Awoke at 10 PM with sharp headache from the vertex of her head radiating down her neck.  Denies facial droop, slurred speech, confusion, extremity weakness/numbness/tingling.  Does have a history of TIA.  Takes Plavix for CAD.  Denies recent travel or trauma.   Past Medical History:  Diagnosis Date  . CAD (coronary artery disease)   . Diabetes mellitus without complication (HCC)   . Hyperlipidemia   . Thyroid nodule     Patient Active Problem List   Diagnosis Date Noted  . Aphasia 05/01/2017  . HLD (hyperlipidemia) 05/01/2017  . Diabetes (HCC) 05/01/2017  . CAD (coronary artery disease) 05/01/2017  . CKD (chronic kidney disease), stage III (HCC) 05/01/2017  . TIA (transient ischemic attack) 03/21/2016    Past Surgical History:  Procedure Laterality Date  . ANKLE SURGERY    . CHOLECYSTECTOMY    . EYE SURGERY    . HUMERUS SURGERY      Prior to Admission medications   Medication Sig Start Date End Date Taking? Authorizing Provider  aspirin EC 81 MG tablet Take 81 mg by mouth daily.    [provider]  clopidogrel (PLAVIX) 75 MG tablet Take 75 mg by mouth daily.    [provider]  cyanocobalamin 2000 MCG tablet Take 2,000 mcg by mouth daily.    [provider]  Dulaglutide  (TRULICITY) 0.75 MG/0.5ML SOPN Inject 0.75 mg into the skin once a week.    [provider]  furosemide (LASIX) 40 MG tablet Take 1-1.5 tablets by mouth daily as needed. 01/04/15   [provider]  insulin NPH-regular Human (NOVOLIN 70/30) (70-30) 100 UNIT/ML injection Inject 30-45 Units into the skin 2 (two) times daily. Injects 30 units during the day and 45 qhs    [provider]  metoprolol succinate (TOPROL-XL) 100 MG 24 hr tablet Take 1.5 tablets by mouth daily. Need to call pharmacy to verify dosage     [provider]  nitroGLYCERIN (NITROSTAT) 0.4 MG SL tablet Place 1 tablet under the tongue every 5 (five) minutes as needed for chest pain. 03/30/14   [provider]  rosuvastatin (CRESTOR) 40 MG tablet Take 1 tablet by mouth daily. 09/22/15   [provider]    Allergies Atorvastatin; Lisinopril; and Penicillins  Family History  Problem Relation Age of Onset  . Hypertension Mother   . Diabetes Mother   . Hypertension Father   . Stroke Father     Social History Social History   Tobacco Use  . Smoking status: Never Smoker  . Smokeless tobacco: Never Used  Substance Use Topics  . Alcohol use: No  . Drug use: Not on file    Review of Systems  Constitutional: No fever/chills. Eyes: No visual changes. ENT: No  sore throat. Cardiovascular: Denies chest pain. Respiratory: Denies shortness of breath. Gastrointestinal: No abdominal pain.  No nausea, no vomiting.  No diarrhea.  No constipation. Genitourinary: Negative for dysuria. Musculoskeletal: Negative for back pain. Skin: Negative for rash. Neurological: Positive for aphasia. Negative for headaches, focal weakness or numbness.   ____________________________________________   PHYSICAL EXAM:  VITAL SIGNS: ED Triage Vitals  Enc Vitals Group     BP      Pulse      Resp      Temp      Temp src      SpO2      Weight      Height      Head Circumference       Peak Flow      Pain Score      Pain Loc      Pain Edu?      Excl. in GC?     Constitutional: Alert and oriented. Well appearing and in no acute distress. Eyes: Conjunctivae are normal. PERRL. EOMI. Head: Atraumatic. Nose: No congestion/rhinnorhea. Mouth/Throat: Mucous membranes are moist.  Oropharynx non-erythematous. Neck: No stridor. No carotid bruits. Cardiovascular: Normal rate, regular rhythm. Grossly normal heart sounds.  Good peripheral circulation. Respiratory: Normal respiratory effort.  No retractions. Lungs CTAB. Gastrointestinal: Soft and nontender. No distention. No abdominal bruits. No CVA tenderness. Musculoskeletal: No lower extremity tenderness nor edema.  No joint effusions. Neurologic:  Alert and oriented x3. CN II-XII intact. Normal speech and language. At times mildly aphasic. No gross focal neurologic deficits are appreciated.  Skin:  Skin is warm, dry and intact. No rash noted. Psychiatric: Mood and affect are normal. Speech and behavior are normal.  ____________________________________________   LABS (all labs ordered are listed, but only abnormal results are displayed)  Labs Reviewed  CBC - Abnormal; Notable for the following components:      Result Value   RBC 5.31 (*)    MCV 79.2 (*)    MCH 25.8 (*)    RDW 16.1 (*)    All other components within normal limits  DIFFERENTIAL - Abnormal; Notable for the following components:   Lymphs Abs 3.8 (*)    All other components within normal limits  GLUCOSE, CAPILLARY - Abnormal; Notable for the following components:   Glucose-Capillary 204 (*)    All other components within normal limits  TROPONIN I  PROTIME-INR  APTT  COMPREHENSIVE METABOLIC PANEL  CBG MONITORING, ED   ____________________________________________  EKG  ED ECG REPORT I, Sonakshi Rolland J, the attending physician, personally viewed and interpreted this ECG.   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 Initial evaluation for acute headache, aphasia. EXAM: CT HEAD WITHOUT CONTRAST TECHNIQUE: Contiguous axial images were obtained from the base of the skull through the vertex without intravenous contrast. COMPARISON:  Prior CT from 03/21/2016. FINDINGS: Brain: Age-related cerebral volume loss with advanced chronic microvascular ischemic disease. No acute intracranial hemorrhage. No acute large vessel territory infarct. Approximate 1 cm hyperdense lesion along the pituitary stalk again seen, indeterminate, but similar to previous. Again, metastasis, lymphoma, granulomatous disease, or suprasellar proteinaceous cyst are differential considerations. No midline shift or mass effect. Diffuse ventricular prominence related to global parenchymal  volume loss without hydrocephalus, most notable at the fourth ventricle. No extra-axial fluid collection. Vascular: No hyperdense vessel. Scattered vascular calcifications noted within the carotid siphons. Skull: No acute scalp soft tissue abnormality.  Calvarium intact. Sinuses/Orbits: Globes and orbital soft tissues within normal limits. Paranasal sinuses and mastoid air cells are clear. Other: None. ASPECTS [facility] Stroke Program Early CT Score) - Ganglionic level infarction (caudate, lentiform nuclei, internal capsule, insula, M1-M3 cortex): 7 - Supraganglionic infarction (M4-M6 cortex): 3 Total score (0-10 with 10 being normal): 10 IMPRESSION: 1. No acute intracranial infarct or other process identified. 2. ASPECTS is 10. 3. Advanced chronic microvascular ischemic disease. 4. Stable 1 cm mass along the pituitary infundibulum as above, indeterminate. Again, follow-up  examination with nonemergent pituitary protocol MRI suggested for further evaluation. Critical Value/emergent results were called by telephone at the time of interpretation on 05/01/2017 at 11:20 pm to Dr. Willy Eddy , who verbally acknowledged these results. Electronically Signed   By: Rise Mu M.D.   On: 05/01/2017 23:26    ____________________________________________   PROCEDURES  Procedure(s) performed:   NIH Stroke Scale  Interval: Performed at 11pm Time: 12:07 AM Person Administering Scale: Ireta Pullman J  Administer stroke scale items in the order listed. Record performance in each category after each subscale exam. Do not go back and change scores. Follow directions provided for each exam technique. Scores should reflect what the patient does, not what the clinician thinks the patient can do. The clinician should record answers while administering the exam and work quickly. Except where indicated, the patient should not be coached (i.e., repeated requests to patient to make a special effort).   1a  Level of consciousness: 0=alert; keenly responsive  1b. LOC questions:  0=Performs both tasks correctly  1c. LOC commands: 0=Performs both tasks correctly  2.  Best Gaze: 0=normal  3.  Visual: 0=No visual loss  4. Facial Palsy: 0=Normal symmetric movement  5a.  Motor left arm: 0=No drift, limb holds 90 (or 45) degrees for full 10 seconds  5b.  Motor right arm: 0=No drift, limb holds 90 (or 45) degrees for full 10 seconds  6a. motor left leg: 0=No drift, limb holds 90 (or 45) degrees for full 10 seconds  6b  Motor right leg:  0=No drift, limb holds 90 (or 45) degrees for full 10 seconds  7. Limb Ataxia: 0=Absent  8.  Sensory: 0=Normal; no sensory loss  9. Best Language:  1=Mild to moderate aphasia; some obvious loss of fluency or facility of comprehension without significant limitation on ideas expressed or form of expression.  10. Dysarthria: 0=Normal  11. Extinction  and Inattention: 0=No abnormality  12. Distal motor function: 0=Normal   Total:   1    Procedures  Critical Care performed: Yes, see critical care note(s)   CRITICAL CARE Performed by: Irean Hong   Total critical care time: 30 minutes  Critical care time was exclusive of separately billable procedures and treating other patients.  Critical care was necessary to treat or prevent imminent or life-threatening deterioration.  Critical care was time spent personally by me on the following activities: development of treatment plan with patient and/or surrogate as well as nursing, discussions with consultants, evaluation of patient's response to treatment, examination of patient, obtaining history from patient or surrogate, ordering and performing treatments and interventions, ordering and review of laboratory studies, ordering and review of radiographic studies, pulse oximetry and re-evaluation of patient's condition. ____________________________________________   INITIAL IMPRESSION / ASSESSMENT AND PLAN /  ED COURSE  As part of my medical decision making, I reviewed the following data within the electronic MEDICAL RECORD NUMBER Nursing notes reviewed and incorporated, Labs reviewed, EKG interpreted, Old chart reviewed, Discussed with admitting physician Dr. Anne Hahn, A consult was requested and obtained from this/these consultant(s) Neurology and Notes from prior ED visits   77 year old female with CAD on Plavix, history of TIA who presents with mild aphagia and headache.  Last well-known time 6 PM. Differential diagnosis includes, but is not limited to, intracranial hemorrhage, meningitis/encephalitis, previous head trauma, cavernous venous thrombosis, tension headache, temporal arteritis, migraine or migraine equivalent, idiopathic intracranial hypertension, CVA, TIA and non-specific headache.   Clinical Course as of May 02 5  Fri May 01, 2017  2335 Spoke with tele-neurologist on-call Dr.  Gaynelle Adu; patient is not a TPA candidate or interventional candidate secondary to time of last well-known status as well as low NIH stroke scale.  Recommends full strength aspirin and admission for further workup.  [JS]    Clinical Course User Index [JS] Irean Hong, MD     ____________________________________________   FINAL CLINICAL IMPRESSION(S) / ED DIAGNOSES  Final diagnoses:  TIA (transient ischemic attack)  Aphasia  Acute nonintractable headache, unspecified headache type     ED Discharge Orders    None       Note:  This document was prepared using Dragon voice recognition software and may include unintentional dictation errors.    Irean Hong, MD 05/02/17 [phone]

## 2017-05-01 NOTE — H&P (Addendum)
[facility] [place] [facility]   PATIENT NAME: Laura Hatfield    MR#:  [id number]  DATE OF BIRTH:  01/27/1941  DATE OF ADMISSION:  05/01/2017  PRIMARY CARE PHYSICIAN: Olin HauserPeyser, Bruce   REQUESTING/REFERRING PHYSICIAN: Dolores FrameSung, MD  CHIEF COMPLAINT:   Chief Complaint  Patient presents with  . Code Stroke    HISTORY OF PRESENT ILLNESS:  Laura Hatfield  is a 77 y.o. female who presents with an episode of a aphasia.  Patient states that this occurred during the morning when she was talking with a friend on the phone.  The symptoms revolve resolved after some time.  Later when she was speaking to her cousin and told him about it he recommended she come to the hospital for evaluation.  She has a history of stroke in the past.  Initial workup here is largely within normal limits, but given her prior history and report of her symptoms this time hospitalist were called for admission and evaluation for stroke  PAST MEDICAL HISTORY:   Past Medical History:  Diagnosis Date  . CAD (coronary artery disease)   . Diabetes mellitus without complication (HCC)   . Hyperlipidemia   . Thyroid nodule      PAST SURGICAL HISTORY:   Past Surgical History:  Procedure Laterality Date  . ANKLE SURGERY    . CHOLECYSTECTOMY    . EYE SURGERY    . HUMERUS SURGERY       SOCIAL HISTORY:   Social History   Tobacco Use  . Smoking status: Never Smoker  . Smokeless tobacco: Never Used  Substance Use Topics  . Alcohol use: No     FAMILY HISTORY:   Family History  Problem Relation Age of Onset  . Hypertension Mother   . Diabetes Mother   . Hypertension Father   . Stroke Father      DRUG ALLERGIES:   Allergies  Allergen Reactions  . Atorvastatin Other (See Comments)    Dr told her taking these were affecting her liver  . Lisinopril Cough  . Penicillins Swelling    Has patient had a PCN reaction causing immediate rash, facial/tongue/throat swelling, SOB or  lightheadedness with hypotension: yes Has patient had a PCN reaction causing severe rash involving mucus membranes or skin necrosis: no Has patient had a PCN reaction that required hospitalization no Has patient had a PCN reaction occurring within the last 10 years: no If all of the above answers are "NO", then may proceed with Cephalosporin use.     MEDICATIONS AT HOME:   Prior to Admission medications   Medication Sig Start Date End Date Taking? Authorizing Provider  aspirin EC 81 MG tablet Take 81 mg by mouth daily.    [provider]  clopidogrel (PLAVIX) 75 MG tablet Take 75 mg by mouth daily.    [provider]  cyanocobalamin 2000 MCG tablet Take 2,000 mcg by mouth daily.    [provider]  Dulaglutide (TRULICITY) 0.75 MG/0.5ML SOPN Inject 0.75 mg into the skin once a week.    [provider]  furosemide (LASIX) 40 MG tablet Take 1-1.5 tablets by mouth daily as needed. 01/04/15   [provider]  insulin NPH-regular Human (NOVOLIN 70/30) (70-30) 100 UNIT/ML injection Inject 30-45 Units into the skin 2 (two) times daily. Injects 30 units during the day and 45 qhs    [provider]  metoprolol succinate (TOPROL-XL) 100 MG 24 hr tablet Take 1.5 tablets by mouth daily.  Need to call pharmacy to verify dosage     [provider]  nitroGLYCERIN (NITROSTAT) 0.4 MG SL tablet Place 1 tablet under the tongue every 5 (five) minutes as needed for chest pain. 03/30/14   [provider]  rosuvastatin (CRESTOR) 40 MG tablet Take 1 tablet by mouth daily. 09/22/15   [provider]    REVIEW OF SYSTEMS:  Review of Systems  Constitutional: Negative for chills, fever, malaise/fatigue and weight loss.  HENT: Negative for ear pain, hearing loss and tinnitus.   Eyes: Negative for blurred vision, double vision, pain and redness.  Respiratory: Negative for cough, hemoptysis and shortness of breath.   Cardiovascular: Negative  for chest pain, palpitations, orthopnea and leg swelling.  Gastrointestinal: Negative for abdominal pain, constipation, diarrhea, nausea and vomiting.  Genitourinary: Negative for dysuria, frequency and hematuria.  Musculoskeletal: Negative for back pain, joint pain and neck pain.  Skin:       No acne, rash, or lesions  Neurological: Positive for speech change. Negative for dizziness, tremors, focal weakness and weakness.  Endo/Heme/Allergies: Negative for polydipsia. Does not bruise/bleed easily.  Psychiatric/Behavioral: Negative for depression. The patient is not nervous/anxious and does not have insomnia.      VITAL SIGNS:   Vitals:   05/01/17 2302 05/01/17 2305 05/01/17 2345  BP: (!) 180/86    Pulse: 68    Resp: 17    Temp: 97.8 F (36.6 C)  97.9 F (36.6 C)  TempSrc: Oral    SpO2: 99%    Weight:  108.9 kg (240 lb)   Height:  5\' 7"  (1.702 m)    Wt Readings from Last 3 Encounters:  05/01/17 108.9 kg (240 lb)  03/21/16 107.7 kg (237 lb 8 oz)    PHYSICAL EXAMINATION:  Physical Exam  Vitals reviewed. Constitutional: She is oriented to person, place, and time. She appears well-developed and well-nourished. No distress.  HENT:  Head: Normocephalic and atraumatic.  Mouth/Throat: Oropharynx is clear and moist.  Eyes: Conjunctivae and EOM are normal. Pupils are equal, round, and reactive to light. No scleral icterus.  Neck: Normal range of motion. Neck supple. No JVD present. No thyromegaly present.  Cardiovascular: Normal rate, regular rhythm and intact distal pulses. Exam reveals no gallop and no friction rub.  No murmur heard. Respiratory: Effort normal and breath sounds normal. No respiratory distress. She has no wheezes. She has no rales.  GI: Soft. Bowel sounds are normal. She exhibits no distension. There is no tenderness.  Musculoskeletal: Normal range of motion. She exhibits no edema.  No arthritis, no gout  Lymphadenopathy:    She has no cervical adenopathy.   Neurological: She is alert and oriented to person, place, and time. No cranial nerve deficit.  Currently no acute focal deficit  Skin: Skin is warm and dry. No rash noted. No erythema.  Psychiatric: She has a normal mood and affect. Her behavior is normal. Judgment and thought content normal.    LABORATORY PANEL:   CBC Recent Labs  Lab 05/01/17 2309  WBC 7.6  HGB 13.7  HCT 42.0  PLT 225   ------------------------------------------------------------------------------------------------------------------  Chemistries  No results for input(s): NA, K, CL, CO2, GLUCOSE, BUN, CREATININE, CALCIUM, MG, AST, ALT, ALKPHOS, BILITOT in the last 168 hours.  Invalid input(s): GFRCGP ------------------------------------------------------------------------------------------------------------------  Cardiac Enzymes No results for input(s): TROPONINI in the last 168 hours. ------------------------------------------------------------------------------------------------------------------  RADIOLOGY:  Ct Head Code Stroke Wo Contrast  Result Date: 05/01/2017 CLINICAL DATA:  Code stroke. Initial evaluation for acute  headache, aphasia. EXAM: CT HEAD WITHOUT CONTRAST TECHNIQUE: Contiguous axial images were obtained from the base of the skull through the vertex without intravenous contrast. COMPARISON:  Prior CT from 03/21/2016. FINDINGS: Brain: Age-related cerebral volume loss with advanced chronic microvascular ischemic disease. No acute intracranial hemorrhage. No acute large vessel territory infarct. Approximate 1 cm hyperdense lesion along the pituitary stalk again seen, indeterminate, but similar to previous. Again, metastasis, lymphoma, granulomatous disease, or suprasellar proteinaceous cyst are differential considerations. No midline shift or mass effect. Diffuse ventricular prominence related to global parenchymal volume loss without hydrocephalus, most notable at the fourth ventricle. No extra-axial  fluid collection. Vascular: No hyperdense vessel. Scattered vascular calcifications noted within the carotid siphons. Skull: No acute scalp soft tissue abnormality.  Calvarium intact. Sinuses/Orbits: Globes and orbital soft tissues within normal limits. Paranasal sinuses and mastoid air cells are clear. Other: None. ASPECTS [facility] Stroke Program Early CT Score) - Ganglionic level infarction (caudate, lentiform nuclei, internal capsule, insula, M1-M3 cortex): 7 - Supraganglionic infarction (M4-M6 cortex): 3 Total score (0-10 with 10 being normal): 10 IMPRESSION: 1. No acute intracranial infarct or other process identified. 2. ASPECTS is 10. 3. Advanced chronic microvascular ischemic disease. 4. Stable 1 cm mass along the pituitary infundibulum as above, indeterminate. Again, follow-up examination with nonemergent pituitary protocol MRI suggested for further evaluation. Critical Value/emergent results were called by telephone at the time of interpretation on 05/01/2017 at 11:20 pm to Dr. Willy Eddy , who verbally acknowledged these results. Electronically Signed   By: Rise Mu M.D.   On: 05/01/2017 23:26    EKG:   Orders placed or performed during the hospital encounter of 05/01/17  . ED EKG  . ED EKG  . EKG 12-Lead  . EKG 12-Lead    IMPRESSION AND PLAN:  Principal Problem:   Aphasia -now resolved, but patient has a history of prior stroke.  Will admit her with stroke admission order set including appropriate imaging, labs, and consults Active Problems:   Diabetes (HCC) -sliding scale insulin with corresponding glucose checks   CAD (coronary artery disease) -continue home meds   HLD (hyperlipidemia) -home dose statin   CKD (chronic kidney disease), stage III (HCC) -at baseline, avoid nephrotoxins and monitor  Chart review performed and case discussed with ED provider. Labs, imaging and ECG reviewed by provider and discussed with patient/family. Management plans discussed with  the patient and/or family.  DVT PROPHYLAXIS: SubQ lovenox  GI PROPHYLAXIS: None  ADMISSION STATUS: Observation  CODE STATUS: Full Code Status History    Date Active Date Inactive Code Status Order ID Comments User Context   03/21/2016 14:18 03/22/2016 19:58 Full Code [id number]  Enedina Finner, MD ED    Advance Directive Documentation     Most Recent Value  Type of Advance Directive  Healthcare Power of Attorney, Living will  Pre-existing out of facility DNR order (yellow form or pink MOST form)  No data  "MOST" Form in Place?  No data      TOTAL TIME TAKING CARE OF THIS PATIENT: 40 minutes.   Leoncio Hansen FIELDING 05/01/2017, 11:53 PM  [facility]  Office  [phone]  CC: Primary care physician; Olin Hauser  Note:  This document was prepared using Dragon voice recognition software and may include unintentional dictation errors.

## 2017-05-01 NOTE — ED Triage Notes (Signed)
Pt states that she was fine as of 2000 this evening but woke up with a big HA at about 2200 and was aphasic. Pt able to formulate complete sentences at this time. Pt is on anticoagulants at this time.

## 2017-05-01 NOTE — Progress Notes (Signed)
   05/01/17 2328  Clinical Encounter Type  Visited With Patient and family together  Visit Type Initial;ED  Referral From Nurse  Consult/Referral To Chaplain   Chaplain responded to code stroke.  Pt and husband declined chaplain services at this time.  Rev. Malaak Stach Zenaida NieceVan [employer] Chaplain

## 2017-05-01 NOTE — ED Notes (Signed)
Per EDP, discontinue code stroke, follow up with consult with tele/neurology

## 2017-05-01 NOTE — ED Notes (Signed)
Patient transported to CT 

## 2017-05-01 NOTE — ED Notes (Signed)
Misty RN states she will notify teleneuro and to keep the neuro cart on wheels in rm

## 2017-05-01 NOTE — ED Notes (Signed)
Misty, Charity fundraiserN - code stroke RN on wheels

## 2017-05-02 ENCOUNTER — Encounter: Payer: Self-pay | Admitting: *Deleted

## 2017-05-02 ENCOUNTER — Observation Stay: Payer: Medicare HMO

## 2017-05-02 ENCOUNTER — Observation Stay
Admit: 2017-05-02 | Discharge: 2017-05-02 | Disposition: A | Discharge status: Home / Self Care | Payer: Medicare HMO | Attending: Internal Medicine | Admitting: Internal Medicine

## 2017-05-02 DIAGNOSIS — R4701 Aphasia: Secondary | ICD-10-CM

## 2017-05-02 LAB — COMPREHENSIVE METABOLIC PANEL
ALT: 31 U/L (ref 14–54)
AST: 38 U/L (ref 15–41)
Albumin: 3.7 g/dL (ref 3.5–5.0)
Alkaline Phosphatase: 110 U/L (ref 38–126)
Anion gap: 9 (ref 5–15)
BILIRUBIN TOTAL: 0.6 mg/dL (ref 0.3–1.2)
BUN: 20 mg/dL (ref 6–20)
CHLORIDE: 108 mmol/L (ref 101–111)
CO2: 23 mmol/L (ref 22–32)
Calcium: 9.1 mg/dL (ref 8.9–10.3)
Creatinine, Ser: 1.08 mg/dL — ABNORMAL HIGH (ref 0.44–1.00)
GFR calc Af Amer: 55 mL/min — ABNORMAL LOW (ref >60)
GFR, EST NON AFRICAN AMERICAN: 48 mL/min — AB (ref >60)
Glucose, Bld: 227 mg/dL — ABNORMAL HIGH (ref 65–99)
POTASSIUM: 4.2 mmol/L (ref 3.5–5.1)
Sodium: 140 mmol/L (ref 135–145)
TOTAL PROTEIN: 7.2 g/dL (ref 6.5–8.1)

## 2017-05-02 LAB — LIPID PANEL
CHOL/HDL RATIO: 3.2 ratio
Cholesterol: 187 mg/dL (ref 0–200)
HDL: 58 mg/dL (ref >40)
LDL Cholesterol: 108 mg/dL — ABNORMAL HIGH (ref 0–99)
Triglycerides: 105 mg/dL (ref <150)
VLDL: 21 mg/dL (ref 0–40)

## 2017-05-02 LAB — APTT: APTT: 33 s (ref 24–36)

## 2017-05-02 LAB — PROTIME-INR
INR: 0.94
PROTHROMBIN TIME: 12.5 s (ref 11.4–15.2)

## 2017-05-02 LAB — GLUCOSE, CAPILLARY
GLUCOSE-CAPILLARY: 219 mg/dL — AB (ref 65–99)
GLUCOSE-CAPILLARY: 219 mg/dL — AB (ref 65–99)
GLUCOSE-CAPILLARY: 238 mg/dL — AB (ref 65–99)

## 2017-05-02 MED ORDER — ACETAMINOPHEN 325 MG PO TABS: 650.0000 mg | ORAL_TABLET | ORAL | Status: DC | PRN

## 2017-05-02 MED ORDER — INSULIN ASPART 100 UNIT/ML ~~LOC~~ SOLN
0.0000 [IU] | Freq: Three times a day (TID) | SUBCUTANEOUS | Status: DC
  Administered 2017-05-02 (×2): 5 [IU] via SUBCUTANEOUS
  Filled 2017-05-02 (×2): qty 1

## 2017-05-02 MED ORDER — CLOPIDOGREL BISULFATE 75 MG PO TABS
75.0000 mg | ORAL_TABLET | Freq: Every day | ORAL | Status: DC
  Administered 2017-05-02: 75 mg via ORAL
  Filled 2017-05-02: qty 1

## 2017-05-02 MED ORDER — STROKE: EARLY STAGES OF RECOVERY BOOK
Freq: Once | Status: AC
  Administered 2017-05-02: 01:00:00

## 2017-05-02 MED ORDER — ROSUVASTATIN CALCIUM 20 MG PO TABS
40.0000 mg | ORAL_TABLET | Freq: Every day | ORAL | Status: DC
  Administered 2017-05-02: 12:00:00 40 mg via ORAL
  Filled 2017-05-02: qty 2

## 2017-05-02 MED ORDER — ACETAMINOPHEN 160 MG/5ML PO SOLN: 650.0000 mg | ORAL | Status: DC | PRN

## 2017-05-02 MED ORDER — METOPROLOL SUCCINATE ER 50 MG PO TB24
150.0000 mg | ORAL_TABLET | Freq: Every day | ORAL | Status: DC
  Filled 2017-05-02: qty 3

## 2017-05-02 MED ORDER — HEPARIN SODIUM (PORCINE) 5000 UNIT/ML IJ SOLN
5000.0000 [IU] | Freq: Three times a day (TID) | INTRAMUSCULAR | Status: DC
  Administered 2017-05-02: 05:00:00 5000 [IU] via SUBCUTANEOUS
  Filled 2017-05-02: qty 1

## 2017-05-02 MED ORDER — ACETAMINOPHEN 650 MG RE SUPP: 650.0000 mg | RECTAL | Status: DC | PRN

## 2017-05-02 MED ORDER — ASPIRIN EC 81 MG PO TBEC
81.0000 mg | DELAYED_RELEASE_TABLET | Freq: Every day | ORAL | Status: DC
  Administered 2017-05-02: 12:00:00 81 mg via ORAL
  Filled 2017-05-02: qty 1

## 2017-05-02 NOTE — ED Notes (Addendum)
Per pt, reports improvement in speech since LKW. Per Adm DR, pt to follow up with consult to neurology and get MRI in am.

## 2017-05-02 NOTE — Progress Notes (Signed)
PT Cancellation Note  Patient Details Name: Purnell ShoemakerShirley Jean Wingrove MRN: [id number] DOB: [birth year]   Cancelled Treatment:    Reason Eval/Treat Not Completed: Patient at procedure or test/unavailable.  Leaving for MRI so will ck later as time and pt allow.   Ivar DrapeRuth E Lora Chavers 05/02/2017, 10:06 AM   Samul Dadauth Odella Appelhans, PT MS Acute Rehab Dept. Number: [facility] [id number] and [facility] [phone]

## 2017-05-02 NOTE — Discharge Summary (Signed)
[facility] - [place] at [facility]   PATIENT NAME: Laura Hatfield    MR#:  [id number]  DATE OF BIRTH:  [birth year]  DATE OF ADMISSION:  05/01/2017 ADMITTING PHYSICIAN: Irean Hong, MD  DATE OF DISCHARGE: 05/02/2017   PRIMARY CARE PHYSICIAN: Peyser, Bruce    ADMISSION DIAGNOSIS:  Aphasia [R47.01] TIA (transient ischemic attack) [G45.9] Acute nonintractable headache, unspecified headache type [R51]  DISCHARGE DIAGNOSIS:  Principal Problem:   Aphasia Active Problems:   HLD (hyperlipidemia)   Diabetes (HCC)   CAD (coronary artery disease)   CKD (chronic kidney disease), stage III (HCC)   SECONDARY DIAGNOSIS:   Past Medical History:  Diagnosis Date  . CAD (coronary artery disease)   . Diabetes mellitus without complication (HCC)   . Hyperlipidemia   . Thyroid nodule     HOSPITAL COURSE:    Aphasia -now resolved, but patient has a history of prior stroke.      Admitted for stroke workup, MRI and MRA on brain is done, echocardiogram and carotid Doppler study and lipid panel were all done and there was no acute stroke.   Seen by neurologist and advised to continue aspirin and Plavix as patient was taking home.   Physical therapy consult was done and this suggested to discharge home with home health. Active Problems:   Diabetes (HCC) -sliding scale insulin with corresponding glucose checks   CAD (coronary artery disease) -continue home meds   HLD (hyperlipidemia) -home dose statin   CKD (chronic kidney disease), stage III (HCC) -at baseline, avoid nephrotoxins and monitor   DISCHARGE CONDITIONS:   Stable.  CONSULTS OBTAINED:  Treatment Team:  Pauletta Browns, MD  DRUG ALLERGIES:   Allergies  Allergen Reactions  . Atorvastatin Other (See Comments)    Dr told her taking these were affecting her liver  . Lisinopril Cough  . Penicillins Swelling    Has patient had a PCN reaction causing immediate rash, facial/tongue/throat swelling, SOB  or lightheadedness with hypotension: yes Has patient had a PCN reaction causing severe rash involving mucus membranes or skin necrosis: no Has patient had a PCN reaction that required hospitalization no Has patient had a PCN reaction occurring within the last 10 years: no If all of the above answers are "NO", then may proceed with Cephalosporin use.     DISCHARGE MEDICATIONS:   Allergies as of 05/02/2017      Reactions   Atorvastatin Other (See Comments)   Dr told her taking these were affecting her liver   Lisinopril Cough   Penicillins Swelling   Has patient had a PCN reaction causing immediate rash, facial/tongue/throat swelling, SOB or lightheadedness with hypotension: yes Has patient had a PCN reaction causing severe rash involving mucus membranes or skin necrosis: no Has patient had a PCN reaction that required hospitalization no Has patient had a PCN reaction occurring within the last 10 years: no If all of the above answers are "NO", then may proceed with Cephalosporin use.      Medication List    TAKE these medications   aspirin EC 81 MG tablet Take 81 mg by mouth daily.   clopidogrel 75 MG tablet Commonly known as:  PLAVIX Take 75 mg by mouth daily.   cyanocobalamin 2000 MCG tablet Take 2,000 mcg by mouth daily.   furosemide 40 MG tablet Commonly known as:  LASIX Take 1-1.5 tablets by mouth daily as needed.   insulin NPH-regular Human (70-30) 100 UNIT/ML injection Commonly known as:  NOVOLIN 70/30  Inject 10-20 Units into the skin 2 (two) times daily. Injects 10 units during the day and 20 qhs   metoprolol succinate 100 MG 24 hr tablet Commonly known as:  TOPROL-XL Take 1.5 tablets by mouth daily. Need to call pharmacy to verify dosage   nitroGLYCERIN 0.4 MG SL tablet Commonly known as:  NITROSTAT Place 1 tablet under the tongue every 5 (five) minutes as needed for chest pain.   rosuvastatin 40 MG tablet Commonly known as:  CRESTOR Take 1 tablet by mouth  daily.   TRULICITY 0.75 MG/0.5ML Sopn Generic drug:  Dulaglutide Inject 0.75 mg into the skin once a week.        DISCHARGE INSTRUCTIONS:    Follow with PMD in 1-2 weeks.  If you experience worsening of your admission symptoms, develop shortness of breath, life threatening emergency, suicidal or homicidal thoughts you must seek medical attention immediately by calling 911 or calling your MD immediately  if symptoms less severe.  You Must read complete instructions/literature along with all the possible adverse reactions/side effects for all the Medicines you take and that have been prescribed to you. Take any new Medicines after you have completely understood and accept all the possible adverse reactions/side effects.   Please note  You were cared for by a hospitalist during your hospital stay. If you have any questions about your discharge medications or the care you received while you were in the hospital after you are discharged, you can call the unit and asked to speak with the hospitalist on call if the hospitalist that took care of you is not available. Once you are discharged, your primary care physician will handle any further medical issues. Please note that NO REFILLS for any discharge medications will be authorized once you are discharged, as it is imperative that you return to your primary care physician (or establish a relationship with a primary care physician if you do not have one) for your aftercare needs so that they can reassess your need for medications and monitor your lab values.    Today   CHIEF COMPLAINT:   Chief Complaint  Patient presents with  . Code Stroke    HISTORY OF PRESENT ILLNESS:  Laura Hatfield  is a 77 y.o. female presents with an episode of a aphasia.  Patient states that this occurred during the morning when she was talking with a friend on the phone.  The symptoms revolve resolved after some time.  Later when she was speaking to her cousin  and told him about it he recommended she come to the hospital for evaluation.  She has a history of stroke in the past.  Initial workup here is largely within normal limits, but given her prior history and report of her symptoms this time hospitalist were called for admission and evaluation for stroke   VITAL SIGNS:  Blood pressure (!) 141/59, pulse 60, temperature 98.4 F (36.9 C), temperature source Oral, resp. rate 17, height 5' 7.5" (1.715 m), weight 106.5 kg (234 lb 11.2 oz), SpO2 96 %.  I/O:    Intake/Output Summary (Last 24 hours) at 05/02/2017 1512 Last data filed at 05/02/2017 1049 Gross per 24 hour  Intake 720 ml  Output -  Net 720 ml    PHYSICAL EXAMINATION:  GENERAL:  77 y.o.-year-old patient lying in the bed with no acute distress.  EYES: Pupils equal, round, reactive to light and accommodation. No scleral icterus. Extraocular muscles intact.  HEENT: Head atraumatic, normocephalic. Oropharynx and nasopharynx clear.  NECK:  Supple, no jugular venous distention. No thyroid enlargement, no tenderness.  LUNGS: Normal breath sounds bilaterally, no wheezing, rales,rhonchi or crepitation. No use of accessory muscles of respiration.  CARDIOVASCULAR: S1, S2 normal. No murmurs, rubs, or gallops.  ABDOMEN: Soft, non-tender, non-distended. Bowel sounds present. No organomegaly or mass.  EXTREMITIES: No pedal edema, cyanosis, or clubbing.  NEUROLOGIC: Cranial nerves II through XII are intact. Muscle strength 5/5 in all extremities. Sensation intact. Gait not checked.  PSYCHIATRIC: The patient is alert and oriented x 3.  SKIN: No obvious rash, lesion, or ulcer.   DATA REVIEW:   CBC Recent Labs  Lab 05/01/17 2309  WBC 7.6  HGB 13.7  HCT 42.0  PLT 225    Chemistries  Recent Labs  Lab 05/02/17 0000  NA 140  K 4.2  CL 108  CO2 23  GLUCOSE 227*  BUN 20  CREATININE 1.08*  CALCIUM 9.1  AST 38  ALT 31  ALKPHOS 110  BILITOT 0.6    Cardiac Enzymes Recent Labs  Lab  05/01/17 2309  TROPONINI <0.03    Microbiology Results  No results found for this or any previous visit.  RADIOLOGY:  Mr Brain Wo Contrast  Result Date: 05/02/2017 CLINICAL DATA:  Aphasia. EXAM: MRI HEAD WITHOUT CONTRAST MRA HEAD WITHOUT CONTRAST TECHNIQUE: Multiplanar, multiecho pulse sequences of the brain and surrounding structures were obtained without intravenous contrast. Angiographic images of the head were obtained using MRA technique without contrast. COMPARISON:  Head CT 05/01/2017 and MRI 03/21/2016 FINDINGS: MRI HEAD FINDINGS Brain: There is no evidence of acute infarct, intracranial hemorrhage, midline shift, or extra-axial fluid collection. There is mild cerebral atrophy. Confluent T2 hyperintensities in the periventricular and deep cerebral white matter are similar to the prior MRI and nonspecific but compatible with advanced chronic small vessel ischemic disease. 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 Vascular: Major intracranial vascular flow voids are preserved. Skull and upper cervical spine: Unremarkable bone marrow signal. Sinuses/Orbits: Bilateral cataract extraction. Minimal mucosal thickening in the ethmoid air cells bilaterally. Clear mastoid air cells. Other: Asymmetric left parotid atrophy. MRA HEAD FINDINGS The visualized distal vertebral arteries are widely patent to the basilar. PICAs appear patent although their origins were not imaged. Patent AICAs and SCAs are visualized bilaterally. The basilar artery is widely patent. There is a fetal origin of the right PCA. PCAs are patent without evidence of significant stenosis. Internal carotid arteries are widely patent from skull base to carotid termini. ACAs and MCAs are patent without evidence of proximal branch occlusion or significant stenosis. No aneurysm is identified. IMPRESSION: 1. No acute intracranial abnormality. 2. Advanced chronic small vessel ischemic disease.  3. 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 4. Negative head MRA. Electronically Signed   By: Sebastian Ache M.D.   On: 05/02/2017 11:14   US Carotid Bilateral (at [facility] And [facility] Only)  Result Date: 05/02/2017 CLINICAL DATA:  Aphasia. History of hypertension, CAD (post coronary stent placement) and diabetes. EXAM: BILATERAL CAROTID DUPLEX ULTRASOUND TECHNIQUE: Wallace Cullens scale imaging, color Doppler and duplex ultrasound were performed of bilateral carotid and vertebral arteries in the neck. COMPARISON:  Carotid Doppler ultrasound - 03/22/2016 FINDINGS: Criteria: Quantification of carotid stenosis is based on velocity parameters that correlate the residual internal carotid diameter with NASCET-based stenosis levels, using the diameter of the distal internal carotid lumen as the denominator for stenosis  measurement. The following velocity measurements were obtained: RIGHT ICA:  63/16 cm/sec CCA:  67/15 cm/sec SYSTOLIC ICA/CCA RATIO:  0.9 DIASTOLIC ICA/CCA RATIO:  1.0 ECA:  79 cm/sec LEFT ICA:  87/20 cm/sec CCA:  82/16 cm/sec SYSTOLIC ICA/CCA RATIO:  1.1 DIASTOLIC ICA/CCA RATIO:  1.2 ECA:  68 cm/sec RIGHT CAROTID ARTERY: There is a minimal to moderate amount of atherosclerotic plaque within the right carotid bulb (image 15), extending to involve the origin and proximal aspects of the right internal carotid artery (image 23), morphologically similar to the 02/2016 examination and again not resulting in elevated peak systolic velocities within the interrogated course the right internal carotid artery to suggest a hemodynamically significant stenosis. RIGHT VERTEBRAL ARTERY:  Antegrade flow LEFT CAROTID ARTERY: There is no grayscale evidence significant intimal thickening or atherosclerotic plaque affecting interrogated portions of the left carotid system. There are no elevated peak systolic velocities within the  interrogated course the left internal carotid artery to suggest a hemodynamically significant stenosis. LEFT VERTEBRAL ARTERY:  Antegrade flow IMPRESSION: 1. Moderate amount of right-sided atherosclerotic plaque, morphologically similar to the 02/2016 examination, and again not resulting in elevated peak systolic velocities within the right internal carotid artery to suggest a hemodynamically significant stenosis. 2. Normal sonographic appearance of the left carotid system. Electronically Signed   By: Simonne Come M.D.   On: 05/02/2017 11:49   Mr Maxine Glenn Head/brain ZO Cm  Result Date: 05/02/2017 CLINICAL DATA:  Aphasia. EXAM: MRI HEAD WITHOUT CONTRAST MRA HEAD WITHOUT CONTRAST TECHNIQUE: Multiplanar, multiecho pulse sequences of the brain and surrounding structures were obtained without intravenous contrast. Angiographic images of the head were obtained using MRA technique without contrast. COMPARISON:  Head CT 05/01/2017 and MRI 03/21/2016 FINDINGS: MRI HEAD FINDINGS Brain: There is no evidence of acute infarct, intracranial hemorrhage, midline shift, or extra-axial fluid collection. There is mild cerebral atrophy. Confluent T2 hyperintensities in the periventricular and deep cerebral white matter are similar to the prior MRI and nonspecific but compatible with advanced chronic small vessel ischemic disease. 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 Vascular: Major intracranial vascular flow voids are preserved. Skull and upper cervical spine: Unremarkable bone marrow signal. Sinuses/Orbits: Bilateral cataract extraction. Minimal mucosal thickening in the ethmoid air cells bilaterally. Clear mastoid air cells. Other: Asymmetric left parotid atrophy. MRA HEAD FINDINGS The visualized distal vertebral arteries are widely patent to the basilar. PICAs appear patent although their origins were not imaged. Patent AICAs and SCAs are visualized bilaterally. The basilar  artery is widely patent. There is a fetal origin of the right PCA. PCAs are patent without evidence of significant stenosis. Internal carotid arteries are widely patent from skull base to carotid termini. ACAs and MCAs are patent without evidence of proximal branch occlusion or significant stenosis. No aneurysm is identified. IMPRESSION: 1. No acute intracranial abnormality. 2. Advanced chronic small vessel ischemic disease. 3. 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 4. Negative head MRA. Electronically Signed   By: Sebastian Ache M.D.   On: 05/02/2017 11:14   Ct Head Code Stroke Wo Contrast  Result Date: 05/01/2017 CLINICAL DATA:  Code stroke. Initial evaluation for acute headache, aphasia. EXAM: CT HEAD WITHOUT CONTRAST TECHNIQUE: Contiguous axial images were obtained from the base of the skull through the vertex without intravenous contrast. COMPARISON:  Prior CT from 03/21/2016. FINDINGS: Brain: Age-related cerebral volume loss with advanced chronic microvascular  ischemic disease. No acute intracranial hemorrhage. No acute large vessel territory infarct. Approximate 1 cm hyperdense lesion along the pituitary stalk again seen, indeterminate, but similar to previous. Again, metastasis, lymphoma, granulomatous disease, or suprasellar proteinaceous cyst are differential considerations. No midline shift or mass effect. Diffuse ventricular prominence related to global parenchymal volume loss without hydrocephalus, most notable at the fourth ventricle. No extra-axial fluid collection. Vascular: No hyperdense vessel. Scattered vascular calcifications noted within the carotid siphons. Skull: No acute scalp soft tissue abnormality.  Calvarium intact. Sinuses/Orbits: Globes and orbital soft tissues within normal limits. Paranasal sinuses and mastoid air cells are clear. Other: None. ASPECTS [facility]([place] Stroke  Program Early CT Score) - Ganglionic level infarction (caudate, lentiform nuclei, internal capsule, insula, M1-M3 cortex): 7 - Supraganglionic infarction (M4-M6 cortex): 3 Total score (0-10 with 10 being normal): 10 IMPRESSION: 1. No acute intracranial infarct or other process identified. 2. ASPECTS is 10. 3. Advanced chronic microvascular ischemic disease. 4. Stable 1 cm mass along the pituitary infundibulum as above, indeterminate. Again, follow-up examination with nonemergent pituitary protocol MRI suggested for further evaluation. Critical Value/emergent results were called by telephone at the time of interpretation on 05/01/2017 at 11:20 pm to Dr. Willy EddyPATRICK ROBINSON , who verbally acknowledged these results. Electronically Signed   By: Rise MuBenjamin  McClintock M.D.   On: 05/01/2017 23:26    EKG:   Orders placed or performed during the hospital encounter of 05/01/17  . ED EKG  . ED EKG  . EKG 12-Lead  . EKG 12-Lead      Management plans discussed with the patient, family and they are in agreement.  CODE STATUS:     Code Status Orders  (From admission, onward)        Start     Ordered   05/02/17 0058  Full code  Continuous     05/02/17 0057    Code Status History    Date Active Date Inactive Code Status Order ID Comments User Context   03/21/2016 14:18 03/22/2016 19:58 Full Code [id number]  Enedina FinnerPatel, Sona, MD ED    Advance Directive Documentation     Most Recent Value  Type of Advance Directive  Healthcare Power of Attorney, Living will  Pre-existing out of facility DNR order (yellow form or pink MOST form)  No data  "MOST" Form in Place?  No data      TOTAL TIME TAKING CARE OF THIS PATIENT: 35 minutes.    Altamese DillingVaibhavkumar Heydy Montilla M.D on 05/02/2017 at 3:12 PM  Between 7am to 6pm - Pager - [phone]  After 6pm go to [email or web address] - password [email or web address]  [facility]  Office  [phone]  CC: Primary care physician; Olin HauserPeyser, Bruce   Note: This dictation was  prepared with Dragon dictation along with smaller phrase technology. Any transcriptional errors that result from this process are unintentional.

## 2017-05-02 NOTE — ED Notes (Signed)
Pharmacy tech in rm

## 2017-05-02 NOTE — Care Management Note (Signed)
Case Management Note  Patient Details  Name: Laura Hatfield MRN: [id number] Date of Birth: 05-Oct-1940  Subjective/Objective:      Discussed discharge planning with [name]. She chose [facility] to be her home health PT and Aide provider. This referral was called to [facility].               Action/Plan:   Expected Discharge Date:  05/02/17               Expected Discharge Plan:     In-House Referral:     Discharge planning Services     Post Acute Care Choice:    Choice offered to:     DME Arranged:    DME Agency:     HH Arranged:    HH Agency:     Status of Service:     If discussed at [employer] Meetings, dates discussed:    Additional Comments:  Kaiden Pech A, RN 05/02/2017, 3:31 PM

## 2017-05-02 NOTE — Evaluation (Signed)
Physical Therapy Evaluation Patient Details Name: Laura Hatfield MRN: [id number] DOB: 01-08-1941 Today's Date: 05/02/2017   History of Present Illness  77 yo female with onset of aphasia was sent to hosp by a cousin, esp since pt had suffered a stroke in the past.  CT and MRI were negative for intra-cranial changes.  PMHx:  CAD, DM, HLD, stroke,   Clinical Impression  Pt was assessed with signficant changes of endurance, but even so was able to walk 250' and climb stairs with PT maintaining security of presence mostly.  Her plan is to continue treatment with HHPT and then could consider outpatient if this is appropriate.  Her family is available to assist her and will recommend them to stay initially to ensure time for pt to regain her endurance.    Follow Up Recommendations Home health PT;Supervision - Intermittent    Equipment Recommendations  None recommended by PT(owns a RW)    Recommendations for Other Services       Precautions / Restrictions Precautions Precautions: Fall Restrictions Weight Bearing Restrictions: No      Mobility  Bed Mobility Overal bed mobility: Needs Assistance Bed Mobility: Supine to Sit;Sit to Supine     Supine to sit: Min guard Sit to supine: Min guard   General bed mobility comments: pt is using elevated HOB  Transfers Overall transfer level: Needs assistance Equipment used: Rolling walker (2 wheeled);1 person hand held assist Transfers: Sit to/from Stand Sit to Stand: Supervision;Min guard         General transfer comment: reminders for hand placement to stand  Ambulation/Gait Ambulation/Gait assistance: Min guard;Min assist Ambulation Distance (Feet): 250 Feet Assistive device: Rolling walker (2 wheeled);1 person hand held assist Gait Pattern/deviations: Step-through pattern;Step-to pattern;Decreased stride length;Shuffle;Wide base of support Gait velocity: reduced Gait velocity interpretation: Below normal speed for  age/gender General Gait Details: has LE edema and is walking with fairly heavy steps  Stairs Stairs: Yes Stairs assistance: Min guard Stair Management: Two rails;Step to pattern;Forwards Number of Stairs: 7 General stair comments: pt lands hard on her heels but is tired and feeling weak  Wheelchair Mobility    Modified Rankin (Stroke Patients Only) Modified Rankin (Stroke Patients Only) Pre-Morbid Rankin Score: No significant disability Modified Rankin: Moderate disability     Balance Overall balance assessment: Needs assistance Sitting-balance support: Feet supported Sitting balance-Leahy Scale: Fair     Standing balance support: Bilateral upper extremity supported;During functional activity Standing balance-Leahy Scale: Fair Standing balance comment: less than fair dynamic standing                             Pertinent Vitals/Pain Pain Assessment: No/denies pain    Home Living Family/patient expects to be discharged to:: Private residence Living Arrangements: Alone Available Help at Discharge: Family;Available PRN/intermittently Type of Home: House Home Access: Stairs to enter Entrance Stairs-Rails: Right;Left;Can reach both Entrance Stairs-Number of Steps: 7 Home Layout: One level Home Equipment: Walker - 2 wheels;Grab bars - toilet;Grab bars - tub/shower Additional Comments: was set up before after her stroke    Prior Function Level of Independence: Independent with assistive device(s)         Comments: was mostly walking with no AD recently     Hand Dominance   Dominant Hand: Right    Extremity/Trunk Assessment   Upper Extremity Assessment Upper Extremity Assessment: Overall WFL for tasks assessed    Lower Extremity Assessment Lower Extremity Assessment: Overall WFL for  tasks assessed    Cervical / Trunk Assessment Cervical / Trunk Assessment: Kyphotic  Communication   Communication: No difficulties  Cognition Arousal/Alertness:  Awake/alert Behavior During Therapy: WFL for tasks assessed/performed Overall Cognitive Status: Within Functional Limits for tasks assessed                                        General Comments      Exercises     Assessment/Plan    PT Assessment Patient needs continued PT services  PT Problem List Decreased strength;Decreased range of motion;Decreased activity tolerance;Decreased balance;Decreased mobility;Decreased coordination;Decreased knowledge of use of DME;Decreased safety awareness;Obesity       PT Treatment Interventions DME instruction;Gait training;Stair training;Functional mobility training;Therapeutic activities;Therapeutic exercise;Balance training;Neuromuscular re-education;Patient/family education    PT Goals (Current goals can be found in the Care Plan section)  Acute Rehab PT Goals Patient Stated Goal: to get her headaches figured out PT Goal Formulation: With patient Time For Goal Achievement: 05/16/17 Potential to Achieve Goals: Good    Frequency Min 2X/week   Barriers to discharge Inaccessible home environment;Decreased caregiver support home alone with 7 steps to enter house    Co-evaluation               AM-PAC PT "6 Clicks" Daily Activity  Outcome Measure Difficulty turning over in bed (including adjusting bedclothes, sheets and blankets)?: A Little Difficulty moving from lying on back to sitting on the side of the bed? : A Little Difficulty sitting down on and standing up from a chair with arms (e.g., wheelchair, bedside commode, etc,.)?: Unable Help needed moving to and from a bed to chair (including a wheelchair)?: A Little Help needed walking in hospital room?: A Little Help needed climbing 3-5 steps with a railing? : A Little 6 Click Score: 16    End of Session Equipment Utilized During Treatment: Gait belt Activity Tolerance: Patient limited by fatigue;Treatment limited secondary to medical complications  (Comment) Patient left: in bed;with call bell/phone within reach Nurse Communication: Mobility status;Other (comment)(discharge information) PT Visit Diagnosis: Unsteadiness on feet (R26.81);Muscle weakness (generalized) (M62.81);Hemiplegia and hemiparesis Hemiplegia - Right/Left: Right Hemiplegia - dominant/non-dominant: Dominant Hemiplegia - caused by: Unspecified    Time: [id number] PT Time Calculation (min) (ACUTE ONLY): 35 min   Charges:   PT Evaluation $PT Eval Moderate Complexity: 1 Mod     PT G Codes:   PT G-Codes **NOT FOR INPATIENT CLASS** Functional Assessment Tool Used: AM-PAC 6 Clicks Basic Mobility    Ivar DrapeRuth E Falana Clagg 05/02/2017, 4:10 PM   Samul Dadauth Vicky Mccanless, PT MS Acute Rehab Dept. Number: [facility] [phone] and [facility] [phone]

## 2017-05-02 NOTE — Consult Note (Signed)
Reason for Consult:aphasia  Referring Physician: Dr. Elisabeth Pigeon   CC: aphasia   HPI: Laura Hatfield is an 77 y.o. female  who presents with an episode of a aphasia.  Patient states that this occurred during the morning when she was talking with a friend on the phone.  The symptoms have resolved and she is back to baseline.   Later when she was speaking to her cousin and told him about it he recommended she come to the hospital for evaluation.  She has a history of stroke in the past.     Past Medical History:  Diagnosis Date  . CAD (coronary artery disease)   . Diabetes mellitus without complication (HCC)   . Hyperlipidemia   . Thyroid nodule     Past Surgical History:  Procedure Laterality Date  . ANKLE SURGERY    . CHOLECYSTECTOMY    . EYE SURGERY    . HUMERUS SURGERY      Family History  Problem Relation Age of Onset  . Hypertension Mother   . Diabetes Mother   . Hypertension Father   . Stroke Father     Social History:  reports that  has never smoked. she has never used smokeless tobacco. She reports that she does not drink alcohol or use drugs.  Allergies  Allergen Reactions  . Atorvastatin Other (See Comments)    Dr told her taking these were affecting her liver  . Lisinopril Cough  . Penicillins Swelling    Has patient had a PCN reaction causing immediate rash, facial/tongue/throat swelling, SOB or lightheadedness with hypotension: yes Has patient had a PCN reaction causing severe rash involving mucus membranes or skin necrosis: no Has patient had a PCN reaction that required hospitalization no Has patient had a PCN reaction occurring within the last 10 years: no If all of the above answers are "NO", then may proceed with Cephalosporin use.     Medications: I have reviewed the patient's current medications.  ROS: History obtained from the patient  General ROS: negative for - chills, fatigue, fever, night sweats, weight gain or weight loss Psychological  ROS: negative for - behavioral disorder, hallucinations, memory difficulties, mood swings or suicidal ideation Ophthalmic ROS: negative for - blurry vision, double vision, eye pain or loss of vision ENT ROS: negative for - epistaxis, nasal discharge, oral lesions, sore throat, tinnitus or vertigo Allergy and Immunology ROS: negative for - hives or itchy/watery eyes Hematological and Lymphatic ROS: negative for - bleeding problems, bruising or swollen lymph nodes Endocrine ROS: negative for - galactorrhea, hair pattern changes, polydipsia/polyuria or temperature intolerance Respiratory ROS: negative for - cough, hemoptysis, shortness of breath or wheezing Cardiovascular ROS: negative for - chest pain, dyspnea on exertion, edema or irregular heartbeat Gastrointestinal ROS: negative for - abdominal pain, diarrhea, hematemesis, nausea/vomiting or stool incontinence Genito-Urinary ROS: negative for - dysuria, hematuria, incontinence or urinary frequency/urgency Musculoskeletal ROS: negative for - joint swelling or muscular weakness Neurological ROS: as noted in HPI Dermatological ROS: negative for rash and skin lesion changes  Physical Examination: Blood pressure (!) 141/59, pulse 60, temperature 98.4 F (36.9 C), temperature source Oral, resp. rate 17, height 5' 7.5" (1.715 m), weight 234 lb 11.2 oz (106.5 kg), SpO2 96 %.  HEENT-  Normocephalic, no lesions, without obvious abnormality.  Normal external eye and conjunctiva.  Normal TM's bilaterally.  Normal auditory canals and external ears. Normal external nose, mucus membranes and septum.  Normal pharynx. Cardiovascular- regular rate and rhythm, S1, S2 normal,  no murmur, click, rub or gallop, pulses palpable throughout   Lungs- Heart exam - S1, S2 normal, no murmur, no gallop, rate regular Abdomen- soft, non-tender; bowel sounds normal; no masses,  no organomegaly Extremities- less then 2 second capillary refill Lymph-no adenopathy  palpable Musculoskeletal-no joint tenderness, deformity or swelling Skin-warm and dry, no hyperpigmentation, vitiligo, or suspicious lesions  Neurological Examination   Mental Status: Alert, oriented, thought content appropriate.  Speech fluent without evidence of aphasia.  Able to follow 3 step commands without difficulty. Cranial Nerves: II: Discs flat bilaterally; Visual fields grossly normal, pupils equal, round, reactive to light and accommodation III,IV, VI: ptosis not present, extra-ocular motions intact bilaterally V,VII: smile symmetric, facial light touch sensation normal bilaterally VIII: hearing normal bilaterally IX,X: gag reflex present XI: bilateral shoulder shrug XII: midline tongue extension Motor: Right : Upper extremity   5/5    Left:     Upper extremity   5/5  Lower extremity   5/5     Lower extremity   5/5 Tone and bulk:normal tone throughout; no atrophy noted Sensory: Pinprick and light touch intact throughout, bilaterally Deep Tendon Reflexes: 2+ and symmetric throughout Plantars: Right: downgoing   Left: downgoing Cerebellar: normal finger-to-nose, normal rapid alternating movements and normal heel-to-shin test Gait: not tested       Laboratory Studies:   Basic Metabolic Panel: Recent Labs  Lab 05/02/17 0000  NA 140  K 4.2  CL 108  CO2 23  GLUCOSE 227*  BUN 20  CREATININE 1.08*  CALCIUM 9.1    Liver Function Tests: Recent Labs  Lab 05/02/17 0000  AST 38  ALT 31  ALKPHOS 110  BILITOT 0.6  PROT 7.2  ALBUMIN 3.7   No results for input(s): LIPASE, AMYLASE in the last 168 hours. No results for input(s): AMMONIA in the last 168 hours.  CBC: Recent Labs  Lab 05/01/17 2309  WBC 7.6  NEUTROABS 2.7  HGB 13.7  HCT 42.0  MCV 79.2*  PLT 225    Cardiac Enzymes: Recent Labs  Lab 05/01/17 2309  TROPONINI <0.03    BNP: Invalid input(s): POCBNP  CBG: Recent Labs  Lab 05/01/17 2326 05/02/17 0307 05/02/17 0745 05/02/17 1154   GLUCAP 204* 219* 219* 238*    Microbiology: No results found for this or any previous visit.  Coagulation Studies: Recent Labs    05/02/17 0017  LABPROT 12.5  INR 0.94    Urinalysis: No results for input(s): COLORURINE, LABSPEC, PHURINE, GLUCOSEU, HGBUR, BILIRUBINUR, KETONESUR, PROTEINUR, UROBILINOGEN, NITRITE, LEUKOCYTESUR in the last 168 hours.  Invalid input(s): APPERANCEUR  Lipid Panel:     Component Value Date/Time   CHOL 187 05/02/2017 0412   TRIG 105 05/02/2017 0412   HDL 58 05/02/2017 0412   CHOLHDL 3.2 05/02/2017 0412   VLDL 21 05/02/2017 0412   LDLCALC 108 (H) 05/02/2017 0412    HgbA1C:  Lab Results  Component Value Date   HGBA1C 8.0 (H) 03/22/2016    Urine Drug Screen:  No results found for: LABOPIA, COCAINSCRNUR, LABBENZ, AMPHETMU, THCU, LABBARB  Alcohol Level: No results for input(s): ETH in the last 168 hours.  Other results: EKG: normal EKG, normal sinus rhythm, unchanged from previous tracings.  Imaging: Mr Brain Wo Contrast  Result Date: 05/02/2017 CLINICAL DATA:  Aphasia. EXAM: MRI HEAD WITHOUT CONTRAST MRA HEAD WITHOUT CONTRAST TECHNIQUE: Multiplanar, multiecho pulse sequences of the brain and surrounding structures were obtained without intravenous contrast. Angiographic images of the head were obtained using MRA technique without contrast.  COMPARISON:  Head CT 05/01/2017 and MRI 03/21/2016 FINDINGS: MRI HEAD FINDINGS Brain: There is no evidence of acute infarct, intracranial hemorrhage, midline shift, or extra-axial fluid collection. There is mild cerebral atrophy. Confluent T2 hyperintensities in the periventricular and deep cerebral white matter are similar to the prior MRI and nonspecific but compatible with advanced chronic small vessel ischemic disease. 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 Vascular: Major intracranial vascular flow voids are preserved. Skull and upper cervical spine:  Unremarkable bone marrow signal. Sinuses/Orbits: Bilateral cataract extraction. Minimal mucosal thickening in the ethmoid air cells bilaterally. Clear mastoid air cells. Other: Asymmetric left parotid atrophy. MRA HEAD FINDINGS The visualized distal vertebral arteries are widely patent to the basilar. PICAs appear patent although their origins were not imaged. Patent AICAs and SCAs are visualized bilaterally. The basilar artery is widely patent. There is a fetal origin of the right PCA. PCAs are patent without evidence of significant stenosis. Internal carotid arteries are widely patent from skull base to carotid termini. ACAs and MCAs are patent without evidence of proximal branch occlusion or significant stenosis. No aneurysm is identified. IMPRESSION: 1. No acute intracranial abnormality. 2. Advanced chronic small vessel ischemic disease. 3. 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 4. Negative head MRA. Electronically Signed   By: Sebastian AcheAllen  Grady M.D.   On: 05/02/2017 11:14   [place] Carotid Bilateral (at [facility] And [facility] Only)  Result Date: 05/02/2017 CLINICAL DATA:  Aphasia. History of hypertension, CAD (post coronary stent placement) and diabetes. EXAM: BILATERAL CAROTID DUPLEX ULTRASOUND TECHNIQUE: Wallace CullensGray scale imaging, color Doppler and duplex ultrasound were performed of bilateral carotid and vertebral arteries in the neck. COMPARISON:  Carotid Doppler ultrasound - 03/22/2016 FINDINGS: Criteria: Quantification of carotid stenosis is based on velocity parameters that correlate the residual internal carotid diameter with NASCET-based stenosis levels, using the diameter of the distal internal carotid lumen as the denominator for stenosis measurement. The following velocity measurements were obtained: RIGHT ICA:  63/16 cm/sec CCA:  67/15 cm/sec SYSTOLIC ICA/CCA RATIO:  0.9 DIASTOLIC ICA/CCA RATIO:  1.0 ECA:  79  cm/sec LEFT ICA:  87/20 cm/sec CCA:  82/16 cm/sec SYSTOLIC ICA/CCA RATIO:  1.1 DIASTOLIC ICA/CCA RATIO:  1.2 ECA:  68 cm/sec RIGHT CAROTID ARTERY: There is a minimal to moderate amount of atherosclerotic plaque within the right carotid bulb (image 15), extending to involve the origin and proximal aspects of the right internal carotid artery (image 23), morphologically similar to the 02/2016 examination and again not resulting in elevated peak systolic velocities within the interrogated course the right internal carotid artery to suggest a hemodynamically significant stenosis. RIGHT VERTEBRAL ARTERY:  Antegrade flow LEFT CAROTID ARTERY: There is no grayscale evidence significant intimal thickening or atherosclerotic plaque affecting interrogated portions of the left carotid system. There are no elevated peak systolic velocities within the interrogated course the left internal carotid artery to suggest a hemodynamically significant stenosis. LEFT VERTEBRAL ARTERY:  Antegrade flow IMPRESSION: 1. Moderate amount of right-sided atherosclerotic plaque, morphologically similar to the 02/2016 examination, and again not resulting in elevated peak systolic velocities within the right internal carotid artery to suggest a hemodynamically significant stenosis. 2. Normal sonographic appearance of the left carotid system. Electronically Signed   By: Simonne ComeJohn  Watts M.D.   On: 05/02/2017 11:49   Mr Maxine GlennMra Head/brain ZOWo Cm  Result Date: 05/02/2017 CLINICAL DATA:  Aphasia. EXAM: MRI HEAD  WITHOUT CONTRAST MRA HEAD WITHOUT CONTRAST TECHNIQUE: Multiplanar, multiecho pulse sequences of the brain and surrounding structures were obtained without intravenous contrast. Angiographic images of the head were obtained using MRA technique without contrast. COMPARISON:  Head CT 05/01/2017 and MRI 03/21/2016 FINDINGS: MRI HEAD FINDINGS Brain: There is no evidence of acute infarct, intracranial hemorrhage, midline shift, or extra-axial fluid collection.  There is mild cerebral atrophy. Confluent T2 hyperintensities in the periventricular and deep cerebral white matter are similar to the prior MRI and nonspecific but compatible with advanced chronic small vessel ischemic disease. 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 Vascular: Major intracranial vascular flow voids are preserved. Skull and upper cervical spine: Unremarkable bone marrow signal. Sinuses/Orbits: Bilateral cataract extraction. Minimal mucosal thickening in the ethmoid air cells bilaterally. Clear mastoid air cells. Other: Asymmetric left parotid atrophy. MRA HEAD FINDINGS The visualized distal vertebral arteries are widely patent to the basilar. PICAs appear patent although their origins were not imaged. Patent AICAs and SCAs are visualized bilaterally. The basilar artery is widely patent. There is a fetal origin of the right PCA. PCAs are patent without evidence of significant stenosis. Internal carotid arteries are widely patent from skull base to carotid termini. ACAs and MCAs are patent without evidence of proximal branch occlusion or significant stenosis. No aneurysm is identified. IMPRESSION: 1. No acute intracranial abnormality. 2. Advanced chronic small vessel ischemic disease. 3. 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 4. Negative head MRA. Electronically Signed   By: Sebastian Ache M.D.   On: 05/02/2017 11:14   Ct Head Code Stroke Wo Contrast  Result Date: 05/01/2017 CLINICAL DATA:  Code stroke. Initial evaluation for acute headache, aphasia. EXAM: CT HEAD WITHOUT CONTRAST TECHNIQUE: Contiguous axial images were obtained from the base of the skull through the vertex without intravenous contrast. COMPARISON:  Prior CT from 03/21/2016. FINDINGS: Brain: Age-related cerebral volume loss with advanced chronic microvascular  ischemic disease. No acute intracranial hemorrhage. No acute large vessel territory infarct. Approximate 1 cm hyperdense lesion along the pituitary stalk again seen, indeterminate, but similar to previous. Again, metastasis, lymphoma, granulomatous disease, or suprasellar proteinaceous cyst are differential considerations. No midline shift or mass effect. Diffuse ventricular prominence related to global parenchymal volume loss without hydrocephalus, most notable at the fourth ventricle. No extra-axial fluid collection. Vascular: No hyperdense vessel. Scattered vascular calcifications noted within the carotid siphons. Skull: No acute scalp soft tissue abnormality.  Calvarium intact. Sinuses/Orbits: Globes and orbital soft tissues within normal limits. Paranasal sinuses and mastoid air cells are clear. Other: None. ASPECTS [facility] Stroke Program Early CT Score) - Ganglionic level infarction (caudate, lentiform nuclei, internal capsule, insula, M1-M3 cortex): 7 - Supraganglionic infarction (M4-M6 cortex): 3 Total score (0-10 with 10 being normal): 10 IMPRESSION: 1. No acute intracranial infarct or other process identified. 2. ASPECTS is 10. 3. Advanced chronic microvascular ischemic disease. 4. Stable 1 cm mass along the pituitary infundibulum as above, indeterminate. Again, follow-up examination with nonemergent pituitary protocol MRI suggested for further evaluation. Critical Value/emergent results were called by telephone at the time of interpretation on 05/01/2017 at 11:20 pm to Dr. Willy Eddy , who verbally acknowledged these results. Electronically Signed   By: Rise Mu M.D.   On: 05/01/2017 23:26     Assessment/Plan:   77 y.o. female  who presents with an episode of a aphasia.  Patient states that this  occurred during the morning when she was talking with a friend on the phone.  The symptoms have resolved and she is back to baseline.   Later when she was speaking to her cousin and told him  about it he recommended she come to the hospital for evaluation.  She has a history of stroke in the past.    Currently back to baseline No new abnormalities on MRI and MRA Appears to be on ASA and plavix at home D/c planning She follows up with [facility]      05/02/2017, 2:18 PM

## 2017-05-02 NOTE — ED Notes (Signed)
TeleNeuro spoke with EDP

## 2017-05-02 NOTE — Progress Notes (Signed)
OT Cancellation Note  Patient Details Name: Laura Hatfield MRN: [id number] DOB: 1940-06-13   Cancelled Treatment:    Reason Eval/Treat Not Completed: Patient at procedure or test/ unavailable(Pt. is at testing, and MRI. Will perform initial visit at a later time/date.)  Olegario MessierElaine Jsoeph Podesta, MS, OTR/L 05/02/2017, 10:38 AM

## 2017-05-02 NOTE — Progress Notes (Signed)
SLP Cancellation Note  Patient Details Name: Laura ShoemakerShirley Jean Eisel MRN: [id number] DOB: January 26, 1941   Cancelled treatment:        Chart reviewed. Pt presented with Aphasia at home but has resolved since admission. ST visited pt for screen. She reports no Dysphagia. Speech was appropriate and clear. No St eval indicated at this time. Please reconsult if further concerns arise.                                                                                                Eather ColasJennifer A Colbert Curenton 05/02/2017, 12:43 PM

## 2017-05-02 NOTE — Plan of Care (Signed)
  Progressing Education: Knowledge of disease or condition will improve 05/02/2017 0553 - Progressing by Florinda MarkerWelch, Laura Nault T, RN Knowledge of secondary prevention will improve 05/02/2017 0553 - Progressing by Florinda MarkerWelch, Laura Santa T, RN Knowledge of patient specific risk factors addressed and post discharge goals established will improve 05/02/2017 0553 - Progressing by Florinda MarkerWelch, Laura Wiginton T, RN Health Behavior/Discharge Planning: Ability to manage health-related needs will improve 05/02/2017 0553 - Progressing by Florinda MarkerWelch, Laura Orr T, RN Nutrition: Risk of aspiration will decrease 05/02/2017 0553 - Progressing by Florinda MarkerWelch, Laura Upshur T, RN Ischemic Stroke/TIA Tissue Perfusion: Complications of ischemic stroke/TIA will be minimized 05/02/2017 0553 - Progressing by Florinda MarkerWelch, Laura Friedlander T, RN Clinical Measurements: Ability to maintain clinical measurements within normal limits will improve 05/02/2017 0553 - Progressing by Florinda MarkerWelch, Laura Merkey T, RN Will remain free from infection 05/02/2017 0553 - Progressing by Florinda MarkerWelch, Laura Sotelo T, RN Diagnostic test results will improve 05/02/2017 0553 - Progressing by Florinda MarkerWelch, Laura Cones T, RN Respiratory complications will improve 05/02/2017 0553 - Progressing by Florinda MarkerWelch, Laura Lewing T, RN Cardiovascular complication will be avoided 05/02/2017 0553 - Progressing by Florinda MarkerWelch, Laura Angst T, RN Activity: Risk for activity intolerance will decrease 05/02/2017 0553 - Progressing by Florinda MarkerWelch, Laura Haughn T, RN Coping: Level of anxiety will decrease 05/02/2017 0553 - Progressing by Florinda MarkerWelch, Laura Forrest T, RN Pain Managment: General experience of comfort will improve 05/02/2017 0553 - Progressing by Florinda MarkerWelch, Laura Lacivita T, RN

## 2017-05-03 LAB — HEMOGLOBIN A1C
Hgb A1c MFr Bld: 8.3 % — ABNORMAL HIGH (ref 4.8–5.6)
MEAN PLASMA GLUCOSE: 192 mg/dL

## 2017-05-03 LAB — ECHOCARDIOGRAM COMPLETE
Height: 67.5 in
WEIGHTICAEL: 3755.2 [oz_av]

## 2017-07-22 ENCOUNTER — Other Ambulatory Visit: Payer: Self-pay

## 2017-07-22 ENCOUNTER — Emergency Department: Payer: Medicare HMO

## 2017-07-22 ENCOUNTER — Encounter: Payer: Self-pay | Admitting: Emergency Medicine

## 2017-07-22 ENCOUNTER — Emergency Department
Admission: EM | Admit: 2017-07-22 | Discharge: 2017-07-22 | Disposition: A | Discharge status: Home / Self Care | Payer: Medicare HMO | Attending: Emergency Medicine | Admitting: Emergency Medicine

## 2017-07-22 DIAGNOSIS — Z8673 Personal history of transient ischemic attack (TIA), and cerebral infarction without residual deficits: Secondary | ICD-10-CM | POA: Insufficient documentation

## 2017-07-22 DIAGNOSIS — I2510 Atherosclerotic heart disease of native coronary artery without angina pectoris: Secondary | ICD-10-CM | POA: Diagnosis not present

## 2017-07-22 DIAGNOSIS — E1122 Type 2 diabetes mellitus with diabetic chronic kidney disease: Secondary | ICD-10-CM | POA: Insufficient documentation

## 2017-07-22 DIAGNOSIS — Z7984 Long term (current) use of oral hypoglycemic drugs: Secondary | ICD-10-CM | POA: Insufficient documentation

## 2017-07-22 DIAGNOSIS — Z7982 Long term (current) use of aspirin: Secondary | ICD-10-CM | POA: Insufficient documentation

## 2017-07-22 DIAGNOSIS — Z7902 Long term (current) use of antithrombotics/antiplatelets: Secondary | ICD-10-CM | POA: Insufficient documentation

## 2017-07-22 DIAGNOSIS — Z79899 Other long term (current) drug therapy: Secondary | ICD-10-CM | POA: Diagnosis not present

## 2017-07-22 DIAGNOSIS — R29810 Facial weakness: Secondary | ICD-10-CM | POA: Diagnosis present

## 2017-07-22 DIAGNOSIS — G459 Transient cerebral ischemic attack, unspecified: Secondary | ICD-10-CM | POA: Insufficient documentation

## 2017-07-22 DIAGNOSIS — R531 Weakness: Secondary | ICD-10-CM | POA: Insufficient documentation

## 2017-07-22 DIAGNOSIS — N183 Chronic kidney disease, stage 3 (moderate): Secondary | ICD-10-CM | POA: Insufficient documentation

## 2017-07-22 LAB — GLUCOSE, CAPILLARY: Glucose-Capillary: 97 mg/dL (ref 65–99)

## 2017-07-22 LAB — COMPREHENSIVE METABOLIC PANEL
ALT: 19 U/L (ref 14–54)
AST: 23 U/L (ref 15–41)
Albumin: 3.8 g/dL (ref 3.5–5.0)
Alkaline Phosphatase: 90 U/L (ref 38–126)
Anion gap: 9 (ref 5–15)
BILIRUBIN TOTAL: 0.6 mg/dL (ref 0.3–1.2)
BUN: 24 mg/dL — AB (ref 6–20)
CHLORIDE: 106 mmol/L (ref 101–111)
CO2: 24 mmol/L (ref 22–32)
Calcium: 9.2 mg/dL (ref 8.9–10.3)
Creatinine, Ser: 1.2 mg/dL — ABNORMAL HIGH (ref 0.44–1.00)
GFR, EST AFRICAN AMERICAN: 48 mL/min — AB (ref >60)
GFR, EST NON AFRICAN AMERICAN: 42 mL/min — AB (ref >60)
Glucose, Bld: 111 mg/dL — ABNORMAL HIGH (ref 65–99)
POTASSIUM: 4.4 mmol/L (ref 3.5–5.1)
Sodium: 139 mmol/L (ref 135–145)
TOTAL PROTEIN: 7.4 g/dL (ref 6.5–8.1)

## 2017-07-22 LAB — CBC
HEMATOCRIT: 42.6 % (ref 35.0–47.0)
HEMOGLOBIN: 14.2 g/dL (ref 12.0–16.0)
MCH: 26.7 pg (ref 26.0–34.0)
MCHC: 33.3 g/dL (ref 32.0–36.0)
MCV: 80.2 fL (ref 80.0–100.0)
Platelets: 224 10*3/uL (ref 150–440)
RBC: 5.31 MIL/uL — ABNORMAL HIGH (ref 3.80–5.20)
RDW: 15.5 % — AB (ref 11.5–14.5)
WBC: 8.2 10*3/uL (ref 3.6–11.0)

## 2017-07-22 LAB — DIFFERENTIAL
BASOS ABS: 0.1 10*3/uL (ref 0–0.1)
BASOS PCT: 1 %
Eosinophils Absolute: 0.4 10*3/uL (ref 0–0.7)
Eosinophils Relative: 5 %
LYMPHS ABS: 3.9 10*3/uL — AB (ref 1.0–3.6)
Lymphocytes Relative: 48 %
MONO ABS: 0.8 10*3/uL (ref 0.2–0.9)
MONOS PCT: 9 %
Neutro Abs: 3 10*3/uL (ref 1.4–6.5)
Neutrophils Relative %: 37 %

## 2017-07-22 LAB — PROTIME-INR
INR: 0.98
Prothrombin Time: 12.9 seconds (ref 11.4–15.2)

## 2017-07-22 LAB — TROPONIN I

## 2017-07-22 LAB — APTT: APTT: 35 s (ref 24–36)

## 2017-07-22 NOTE — ED Notes (Signed)
First nurse note: LKW 1300

## 2017-07-22 NOTE — ED Notes (Addendum)
Code Stroke cancelled at 1344 per Dr. Cyril Loosen after speaking with patient. Pt taken to CT by [facility], EDT at this time.

## 2017-07-22 NOTE — ED Notes (Signed)
Pt states approx 1245-1300 pt went to stand up and when she went to stand up she tried to use her R hand to grab a bed post but she was unable to grab the bedpost, pt also c/o R facial droop, pt also states she felt like she was going to faint but she "managed not too". Pt states symptoms completely resolved upon her arrival to hospital.

## 2017-07-22 NOTE — ED Provider Notes (Signed)
[facility] Emergency Department Provider Note   ____________________________________________    I have reviewed the triage vital signs and the nursing notes.   HISTORY  Chief Complaint Facial Droop     HPI Laura Hatfield is a 77 y.o. female who presents with complaints of a right facial droop and right hand weakness.  Patient reports approximately 45 days prior to arrival she had abrupt onset of right hand weakness and a brief right facial droop.  Symptoms resolved within 15 minutes.  She reports she has had these symptoms in the past most recently in March when she was admitted for similar complaints.  She reports compliance with her Plavix and aspirin.  Currently feels well and has no complaints.  No headache.  No nausea or vomiting   Past Medical History:  Diagnosis Date  . CAD (coronary artery disease)   . Diabetes mellitus without complication (HCC)   . Hyperlipidemia   . Thyroid nodule     Patient Active Problem List   Diagnosis Date Noted  . Aphasia 05/01/2017  . HLD (hyperlipidemia) 05/01/2017  . Diabetes (HCC) 05/01/2017  . CAD (coronary artery disease) 05/01/2017  . CKD (chronic kidney disease), stage III (HCC) 05/01/2017  . TIA (transient ischemic attack) 03/21/2016    Past Surgical History:  Procedure Laterality Date  . ANKLE SURGERY    . CHOLECYSTECTOMY    . EYE SURGERY    . HUMERUS SURGERY      Prior to Admission medications   Medication Sig Start Date End Date Taking? Authorizing Provider  aspirin EC 81 MG tablet Take 81 mg by mouth daily.   Yes [provider]  cholecalciferol (VITAMIN D) 1000 units tablet Take 1,000 Units by mouth daily.   Yes [provider]  clopidogrel (PLAVIX) 75 MG tablet Take 75 mg by mouth daily.   Yes [provider]  cyanocobalamin 2000 MCG tablet Take 2,000 mcg by mouth daily.   Yes [provider]  furosemide (LASIX) 40 MG tablet Take 60 mg by mouth  daily as needed.  01/04/15  Yes [provider]  gabapentin (NEURONTIN) 100 MG capsule Take 1 capsule by mouth daily as needed. 09/01/16  Yes [provider]  metoprolol succinate (TOPROL-XL) 100 MG 24 hr tablet Take 150 mg by mouth daily. Need to call pharmacy to verify dosage    Yes [provider]  rosuvastatin (CRESTOR) 20 MG tablet Take 1 tablet by mouth daily.  09/22/15  Yes [provider]  Dulaglutide (TRULICITY) 0.75 MG/0.5ML SOPN Inject 0.75 mg into the skin once a week.    [provider]  nitroGLYCERIN (NITROSTAT) 0.4 MG SL tablet Place 1 tablet under the tongue every 5 (five) minutes as needed for chest pain. 03/30/14   [provider]     Allergies Atorvastatin; Lipitor [atorvastatin calcium]; Lisinopril; and Penicillins  Family History  Problem Relation Age of Onset  . Hypertension Mother   . Diabetes Mother   . Hypertension Father   . Stroke Father     Social History Social History   Tobacco Use  . Smoking status: Never Smoker  . Smokeless tobacco: Never Used  Substance Use Topics  . Alcohol use: No  . Drug use: No    Review of Systems  Constitutional: No fever/chills Eyes: No visual changes.  ENT: No sore throat. Cardiovascular: Denies chest pain. Respiratory: Denies shortness of breath. Gastrointestinal: No nausea, no vomiting.   Genitourinary: Negative for dysuria. Musculoskeletal: Negative  for back pain. Skin: Negative for rash. Neurological: As above   ____________________________________________   PHYSICAL EXAM:  VITAL SIGNS: ED Triage Vitals  Enc Vitals Group     BP 07/22/17 1342 (!) 157/95     Pulse Rate 07/22/17 1338 (!) 51     Resp 07/22/17 1338 16     Temp 07/22/17 1338 98 F (36.7 C)     Temp Source 07/22/17 1338 Oral     SpO2 07/22/17 1338 100 %     Weight 07/22/17 1338 104.3 kg (230 lb)     Height 07/22/17 1338 1.702 m ( )     Head Circumference --      Peak Flow --       Pain Score 07/22/17 1338 0     Pain Loc --      Pain Edu? --      Excl. in GC? --     Constitutional: Alert and oriented. No acute distress. Pleasant and interactive Eyes: Conjunctivae are normal.  Head: Atraumatic.  No facial droop Nose: No congestion/rhinnorhea. Mouth/Throat: Mucous membranes are moist.    Cardiovascular: Normal rate, regular rhythm.  Good peripheral circulation. Respiratory: Normal respiratory effort.  No retractions.  Gastrointestinal: Soft and nontender. No distention.  No CVA tenderness. Genitourinary: deferred Musculoskeletal:  Warm and well perfused Neurologic:  Normal speech and language. No gross focal neurologic deficits are appreciated.  Cranial nerves normal.  Normal strength in all extremities Skin:  Skin is warm, dry and intact. No rash noted. Psychiatric: Mood and affect are normal. Speech and behavior are normal.  ____________________________________________   LABS (all labs ordered are listed, but only abnormal results are displayed)  Labs Reviewed  CBC - Abnormal; Notable for the following components:      Result Value   RBC 5.31 (*)    RDW 15.5 (*)    All other components within normal limits  DIFFERENTIAL - Abnormal; Notable for the following components:   Lymphs Abs 3.9 (*)    All other components within normal limits  COMPREHENSIVE METABOLIC PANEL - Abnormal; Notable for the following components:   Glucose, Bld 111 (*)    BUN 24 (*)    Creatinine, Ser 1.20 (*)    GFR calc non Af Amer 42 (*)    GFR calc Af Amer 48 (*)    All other components within normal limits  GLUCOSE, CAPILLARY  PROTIME-INR  APTT  TROPONIN I  CBG MONITORING, ED   ____________________________________________  EKG  ED ECG REPORT I, Jene Every, the attending physician, personally viewed and interpreted this ECG.  Date: 07/22/2017  Rhythm: Sinus bradycardia QRS Axis: normal Intervals: normal ST/T Wave abnormalities: normal Narrative Interpretation:  no evidence of acute ischemia  ____________________________________________  RADIOLOGY  CT head negative for acute stroke ____________________________________________   PROCEDURES  Procedure(s) performed: No  Procedures   Critical Care performed: No ____________________________________________   INITIAL IMPRESSION / ASSESSMENT AND PLAN / ED COURSE  Pertinent labs & imaging results that were available during my care of the patient were reviewed by me and considered in my medical decision making (see chart for details).  Patient presents with symptoms most consistent with TIA.  Patient has had TIAs in the past negative work-ups.  She feels quite well now and has no complaints.  Discussed with Dr. Thad Ranger of neurology who feels given recent admission no benefit to admission today especially given the patient is feeling well compliant with her medications.  Patient agrees with this plan, she  does not want to stay in the hospital .patient has good outpatient follow-up.  Strict return precautions discussed    ____________________________________________   FINAL CLINICAL IMPRESSION(S) / ED DIAGNOSES  Final diagnoses:  TIA (transient ischemic attack)        Note:  This document was prepared using Dragon voice recognition software and may include unintentional dictation errors.    Jene Every, MD 07/22/17 2004

## 2017-07-22 NOTE — Progress Notes (Signed)
CODE STROKE- PHARMACY COMMUNICATION   Time CODE STROKE called/page received: 1343  Time response to CODE STROKE was made (in person or via phone): 1359  Time Stroke Kit retrieved from [place] (only if needed):N/A  Name of Provider/Nurse contacted:Megan- According to RN Code Stroke Canceled   Past Medical History:  Diagnosis Date  . CAD (coronary artery disease)   . Diabetes mellitus without complication ([place])   . Hyperlipidemia   . Thyroid nodule    Prior to Admission medications   Medication Sig Start Date End Date Taking? Authorizing Provider  aspirin EC 81 MG tablet Take 81 mg by mouth daily.    [provider]  clopidogrel (PLAVIX) 75 MG tablet Take 75 mg by mouth daily.    [provider]  cyanocobalamin 2000 MCG tablet Take 2,000 mcg by mouth daily.    [provider]  Dulaglutide (TRULICITY) 8.75 IE/3.3IR SOPN Inject 0.75 mg into the skin once a week.    [provider]  furosemide (LASIX) 40 MG tablet Take 1-1.5 tablets by mouth daily as needed. 01/04/15   [provider]  insulin NPH-regular Human (NOVOLIN 70/30) (70-30) 100 UNIT/ML injection Inject 10-20 Units into the skin 2 (two) times daily. Injects 10 units during the day and 20 qhs    [provider]  metoprolol succinate (TOPROL-XL) 100 MG 24 hr tablet Take 1.5 tablets by mouth daily. Need to call pharmacy to verify dosage     [provider]  nitroGLYCERIN (NITROSTAT) 0.4 MG SL tablet Place 1 tablet under the tongue every 5 (five) minutes as needed for chest pain. 03/30/14   [provider]  rosuvastatin (CRESTOR) 40 MG tablet Take 1 tablet by mouth daily. 09/22/15   [provider]    Pernell Dupre ,PharmD Clinical Pharmacist  07/22/2017  2:01 PM

## 2017-07-22 NOTE — Progress Notes (Signed)
Chaplain responded to a code stroke. Chaplain checked in pt cousin was in the room , Pt was in CT. Code was canceled. Family had no needs. Chaplain let family know they are available if needed.    07/22/17 1341  Clinical Encounter Type  Visited With Family  Visit Type Initial;Code  Referral From Nurse  Spiritual Encounters  Spiritual Needs Emotional  Advance Directives (For Healthcare)  Does Patient Have a Medical Advance Directive? No  Would patient like information on creating a medical advance directive? No - Patient declined  Mental Health Advance Directives  Does Patient Have a Mental Health Advance Directive? No  Would patient like information on creating a mental health advance directive? No - Patient declined

## 2017-07-22 NOTE — ED Notes (Signed)
NAD noted at time of D/C. Pt denies any questions/concerns regarding D/C instructions. Pt taken to lobby via wheelchair at time of D/C by this RN.

## 2017-07-22 NOTE — ED Triage Notes (Signed)
Pt to ED from home c/o right hand weakness and right facial droop and numbness that started at 1245 today.  In triage patient has equal strength in bilateral extremities, no facial droop, sensations intact.  A&Ox4, speaking in complete and coherent sentences.

## 2017-07-26 ENCOUNTER — Observation Stay
Admission: EM | Admit: 2017-07-26 | Discharge: 2017-07-27 | Disposition: A | Discharge status: Home / Self Care | Payer: Medicare HMO | Attending: Internal Medicine | Admitting: Internal Medicine

## 2017-07-26 ENCOUNTER — Encounter: Payer: Self-pay | Admitting: Internal Medicine

## 2017-07-26 ENCOUNTER — Emergency Department: Payer: Medicare HMO

## 2017-07-26 ENCOUNTER — Other Ambulatory Visit: Payer: Self-pay

## 2017-07-26 DIAGNOSIS — Z794 Long term (current) use of insulin: Secondary | ICD-10-CM | POA: Insufficient documentation

## 2017-07-26 DIAGNOSIS — Z7982 Long term (current) use of aspirin: Secondary | ICD-10-CM | POA: Diagnosis not present

## 2017-07-26 DIAGNOSIS — Z823 Family history of stroke: Secondary | ICD-10-CM | POA: Diagnosis not present

## 2017-07-26 DIAGNOSIS — E1122 Type 2 diabetes mellitus with diabetic chronic kidney disease: Secondary | ICD-10-CM | POA: Diagnosis not present

## 2017-07-26 DIAGNOSIS — Z9049 Acquired absence of other specified parts of digestive tract: Secondary | ICD-10-CM | POA: Diagnosis not present

## 2017-07-26 DIAGNOSIS — I2510 Atherosclerotic heart disease of native coronary artery without angina pectoris: Secondary | ICD-10-CM | POA: Diagnosis not present

## 2017-07-26 DIAGNOSIS — I129 Hypertensive chronic kidney disease with stage 1 through stage 4 chronic kidney disease, or unspecified chronic kidney disease: Secondary | ICD-10-CM | POA: Insufficient documentation

## 2017-07-26 DIAGNOSIS — I160 Hypertensive urgency: Principal | ICD-10-CM | POA: Insufficient documentation

## 2017-07-26 DIAGNOSIS — Z8673 Personal history of transient ischemic attack (TIA), and cerebral infarction without residual deficits: Secondary | ICD-10-CM | POA: Diagnosis not present

## 2017-07-26 DIAGNOSIS — Z888 Allergy status to other drugs, medicaments and biological substances status: Secondary | ICD-10-CM | POA: Insufficient documentation

## 2017-07-26 DIAGNOSIS — Z79899 Other long term (current) drug therapy: Secondary | ICD-10-CM | POA: Diagnosis not present

## 2017-07-26 DIAGNOSIS — R519 Headache, unspecified: Secondary | ICD-10-CM

## 2017-07-26 DIAGNOSIS — Z8249 Family history of ischemic heart disease and other diseases of the circulatory system: Secondary | ICD-10-CM | POA: Insufficient documentation

## 2017-07-26 DIAGNOSIS — Z880 Allergy status to penicillin: Secondary | ICD-10-CM | POA: Diagnosis not present

## 2017-07-26 DIAGNOSIS — N183 Chronic kidney disease, stage 3 (moderate): Secondary | ICD-10-CM | POA: Insufficient documentation

## 2017-07-26 DIAGNOSIS — E041 Nontoxic single thyroid nodule: Secondary | ICD-10-CM | POA: Insufficient documentation

## 2017-07-26 DIAGNOSIS — Z833 Family history of diabetes mellitus: Secondary | ICD-10-CM | POA: Insufficient documentation

## 2017-07-26 DIAGNOSIS — R51 Headache: Secondary | ICD-10-CM | POA: Insufficient documentation

## 2017-07-26 DIAGNOSIS — E785 Hyperlipidemia, unspecified: Secondary | ICD-10-CM | POA: Insufficient documentation

## 2017-07-26 LAB — TROPONIN I: Troponin I: <0.03 ng/mL (ref <0.03)

## 2017-07-26 LAB — CBC
HEMATOCRIT: 44 % (ref 35.0–47.0)
HEMOGLOBIN: 14.5 g/dL (ref 12.0–16.0)
MCH: 26.4 pg (ref 26.0–34.0)
MCHC: 32.9 g/dL (ref 32.0–36.0)
MCV: 80.2 fL (ref 80.0–100.0)
Platelets: 231 10*3/uL (ref 150–440)
RBC: 5.48 MIL/uL — ABNORMAL HIGH (ref 3.80–5.20)
RDW: 16 % — ABNORMAL HIGH (ref 11.5–14.5)
WBC: 6.3 10*3/uL (ref 3.6–11.0)

## 2017-07-26 LAB — URINALYSIS, COMPLETE (UACMP) WITH MICROSCOPIC
BILIRUBIN URINE: NEGATIVE
Glucose, UA: NEGATIVE mg/dL
HGB URINE DIPSTICK: NEGATIVE
KETONES UR: NEGATIVE mg/dL
LEUKOCYTES UA: NEGATIVE
NITRITE: NEGATIVE
PROTEIN: 100 mg/dL — AB
Specific Gravity, Urine: 1.017 (ref 1.005–1.030)
pH: 6 (ref 5.0–8.0)

## 2017-07-26 LAB — BASIC METABOLIC PANEL
ANION GAP: 10 (ref 5–15)
BUN: 15 mg/dL (ref 6–20)
CHLORIDE: 106 mmol/L (ref 101–111)
CO2: 25 mmol/L (ref 22–32)
Calcium: 9.2 mg/dL (ref 8.9–10.3)
Creatinine, Ser: 1.13 mg/dL — ABNORMAL HIGH (ref 0.44–1.00)
GFR calc Af Amer: 52 mL/min — ABNORMAL LOW (ref >60)
GFR, EST NON AFRICAN AMERICAN: 45 mL/min — AB (ref >60)
GLUCOSE: 97 mg/dL (ref 65–99)
POTASSIUM: 4 mmol/L (ref 3.5–5.1)
SODIUM: 141 mmol/L (ref 135–145)

## 2017-07-26 LAB — GLUCOSE, CAPILLARY
Glucose-Capillary: 89 mg/dL (ref 65–99)
Glucose-Capillary: 168 mg/dL — ABNORMAL HIGH (ref 65–99)

## 2017-07-26 MED ORDER — SODIUM CHLORIDE 0.9% FLUSH
3.0000 mL | INTRAVENOUS | Status: DC | PRN
Start: 2017-07-26 — End: 2017-07-27

## 2017-07-26 MED ORDER — DULAGLUTIDE 0.75 MG/0.5ML ~~LOC~~ SOAJ: 0.7500 mg | SUBCUTANEOUS | Status: DC

## 2017-07-26 MED ORDER — VITAMIN D3 25 MCG (1000 UNIT) PO TABS
1000.0000 [IU] | ORAL_TABLET | Freq: Every day | ORAL | Status: DC
  Administered 2017-07-27: 1000 [IU] via ORAL
  Filled 2017-07-26 (×2): qty 1

## 2017-07-26 MED ORDER — METOPROLOL TARTRATE 50 MG PO TABS
50.0000 mg | ORAL_TABLET | Freq: Two times a day (BID) | ORAL | Status: DC
  Administered 2017-07-26 – 2017-07-27 (×3): 50 mg via ORAL
  Filled 2017-07-26 (×2): qty 1

## 2017-07-26 MED ORDER — METOPROLOL TARTRATE 50 MG PO TABS
ORAL_TABLET | ORAL | Status: AC
  Administered 2017-07-26: 50 mg via ORAL
  Filled 2017-07-26: qty 1

## 2017-07-26 MED ORDER — INSULIN ASPART 100 UNIT/ML ~~LOC~~ SOLN: 0.0000 [IU] | Freq: Every day | SUBCUTANEOUS | Status: DC

## 2017-07-26 MED ORDER — GABAPENTIN 100 MG PO CAPS
100.0000 mg | ORAL_CAPSULE | Freq: Three times a day (TID) | ORAL | Status: DC
  Administered 2017-07-26 – 2017-07-27 (×2): 100 mg via ORAL
  Filled 2017-07-26 (×2): qty 1

## 2017-07-26 MED ORDER — FENTANYL CITRATE (PF) 100 MCG/2ML IJ SOLN
50.0000 ug | Freq: Once | INTRAMUSCULAR | Status: AC
  Administered 2017-07-26: 50 ug via INTRAVENOUS
  Filled 2017-07-26: qty 2

## 2017-07-26 MED ORDER — ACETAMINOPHEN 325 MG PO TABS: 650.0000 mg | ORAL_TABLET | Freq: Four times a day (QID) | ORAL | Status: DC | PRN

## 2017-07-26 MED ORDER — ONDANSETRON HCL 4 MG/2ML IJ SOLN
4.0000 mg | Freq: Once | INTRAMUSCULAR | Status: AC
Start: 2017-07-26 — End: 2017-07-26
  Administered 2017-07-26: 4 mg via INTRAVENOUS
  Filled 2017-07-26: qty 2

## 2017-07-26 MED ORDER — ENOXAPARIN SODIUM 40 MG/0.4ML ~~LOC~~ SOLN
40.0000 mg | SUBCUTANEOUS | Status: DC
  Administered 2017-07-26: 40 mg via SUBCUTANEOUS
  Filled 2017-07-26: qty 0.4

## 2017-07-26 MED ORDER — ROSUVASTATIN CALCIUM 10 MG PO TABS
20.0000 mg | ORAL_TABLET | Freq: Every day | ORAL | Status: DC
  Administered 2017-07-27: 20 mg via ORAL
  Filled 2017-07-26: qty 2

## 2017-07-26 MED ORDER — ONDANSETRON HCL 4 MG/2ML IJ SOLN: 4.0000 mg | Freq: Four times a day (QID) | INTRAMUSCULAR | Status: DC | PRN

## 2017-07-26 MED ORDER — VITAMIN B-12 1000 MCG PO TABS
2000.0000 ug | ORAL_TABLET | Freq: Every day | ORAL | Status: DC
  Administered 2017-07-26 – 2017-07-27 (×2): 2000 ug via ORAL
  Filled 2017-07-26 (×2): qty 2

## 2017-07-26 MED ORDER — NITROGLYCERIN 0.4 MG SL SUBL: 0.4000 mg | SUBLINGUAL_TABLET | SUBLINGUAL | Status: DC | PRN

## 2017-07-26 MED ORDER — SODIUM CHLORIDE 0.9 % IV SOLN: 250.0000 mL | INTRAVENOUS | Status: DC | PRN

## 2017-07-26 MED ORDER — ONDANSETRON HCL 4 MG PO TABS: 4.0000 mg | ORAL_TABLET | Freq: Four times a day (QID) | ORAL | Status: DC | PRN

## 2017-07-26 MED ORDER — ACETAMINOPHEN 650 MG RE SUPP: 650.0000 mg | Freq: Four times a day (QID) | RECTAL | Status: DC | PRN

## 2017-07-26 MED ORDER — ASPIRIN EC 81 MG PO TBEC
81.0000 mg | DELAYED_RELEASE_TABLET | Freq: Every day | ORAL | Status: DC
  Administered 2017-07-26 – 2017-07-27 (×2): 81 mg via ORAL
  Filled 2017-07-26 (×2): qty 1

## 2017-07-26 MED ORDER — HYDRALAZINE HCL 20 MG/ML IJ SOLN
10.0000 mg | Freq: Once | INTRAMUSCULAR | Status: AC
  Administered 2017-07-26: 10 mg via INTRAVENOUS
  Filled 2017-07-26: qty 1

## 2017-07-26 MED ORDER — SENNOSIDES-DOCUSATE SODIUM 8.6-50 MG PO TABS: 1.0000 | ORAL_TABLET | Freq: Every evening | ORAL | Status: DC | PRN

## 2017-07-26 MED ORDER — HYDRALAZINE HCL 20 MG/ML IJ SOLN: 10.0000 mg | INTRAMUSCULAR | Status: DC | PRN

## 2017-07-26 MED ORDER — CLOPIDOGREL BISULFATE 75 MG PO TABS
75.0000 mg | ORAL_TABLET | Freq: Every day | ORAL | Status: DC
  Administered 2017-07-27: 75 mg via ORAL
  Filled 2017-07-26: qty 1

## 2017-07-26 MED ORDER — INSULIN ASPART 100 UNIT/ML ~~LOC~~ SOLN
0.0000 [IU] | Freq: Three times a day (TID) | SUBCUTANEOUS | Status: DC
  Administered 2017-07-27: 5 [IU] via SUBCUTANEOUS
  Filled 2017-07-26: qty 1

## 2017-07-26 MED ORDER — HYDRALAZINE HCL 50 MG PO TABS
25.0000 mg | ORAL_TABLET | Freq: Three times a day (TID) | ORAL | Status: DC
  Administered 2017-07-26 – 2017-07-27 (×3): 25 mg via ORAL
  Filled 2017-07-26 (×3): qty 1

## 2017-07-26 MED ORDER — FUROSEMIDE 40 MG PO TABS
40.0000 mg | ORAL_TABLET | Freq: Every day | ORAL | Status: DC
  Administered 2017-07-26 – 2017-07-27 (×2): 40 mg via ORAL
  Filled 2017-07-26 (×2): qty 1

## 2017-07-26 MED ORDER — SODIUM CHLORIDE 0.9% FLUSH
3.0000 mL | Freq: Two times a day (BID) | INTRAVENOUS | Status: DC
  Administered 2017-07-26 – 2017-07-27 (×2): 3 mL via INTRAVENOUS

## 2017-07-26 NOTE — ED Notes (Signed)
Pt oxygen saturation dropped into 85% on room air after receiving pain medication. Pt had fallen asleep as well.  Pt placed on 2L [place], sats increased to 94% on 2L

## 2017-07-26 NOTE — Progress Notes (Signed)
Advanced care plan. Purpose of the Encounter: CODE STATUS Parties in Attendance: Patient Patient's Decision Capacity: Good  Subjective/Patient's story: Presented with headache Objective/Medical story Patient has elevated blood pressure and headache Needs control of blood pressure Goals of care determination:  Advanced directives discussed with patient and goals of care discussed For now patient wants everything done which includes cardiac resuscitation, intubation and ventilator if the need arises CODE STATUS: Full code Time spent discussing advanced care planning: 16 minutes

## 2017-07-26 NOTE — H&P (Signed)
[facility]   PATIENT NAME: Laura Hatfield    MR#:  [id number]  DATE OF BIRTH:  08-03-1940  DATE OF ADMISSION:  07/26/2017  PRIMARY CARE PHYSICIAN: Olin Hauser   REQUESTING/REFERRING PHYSICIAN:   CHIEF COMPLAINT:   Chief Complaint  Patient presents with  . Headache  . Nausea    HISTORY OF PRESENT ILLNESS: Laura Hatfield  is a 77 y.o. female with a known history of coronary disease, diabetes mellitus type 2, hyperlipidemia, thyroid nodule presented to the emergency room with headache.  Patient had this headache starting at 4 AM was throbbing in nature.  No history of any tingling or numbness in any part of the body.  Had some nausea but no vomitings.  Patient had history of TIA in the past and was worked up for stroke.  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  Her headache has improved after coming to the emergency room but blood pressure is elevated.  No change in vision.  No complaints of any facial droop.  No weakness in any part of the body.  Hospitalist service was consulted for further care.  Patient was worked up with CT head in the emergency room which showed no acute abnormality.  PAST MEDICAL HISTORY:   Past Medical History:  Diagnosis Date  . CAD (coronary artery disease)   . Diabetes mellitus without complication (HCC)   . Hyperlipidemia   . Thyroid nodule   Pituitary mass  PAST SURGICAL HISTORY:  Past Surgical History:  Procedure Laterality Date  . ANKLE SURGERY    . CHOLECYSTECTOMY    . EYE SURGERY    . HUMERUS SURGERY      SOCIAL HISTORY:  Social History   Tobacco Use  . Smoking status: Never Smoker  . Smokeless tobacco: Never Used  Substance Use Topics  . Alcohol use: No    FAMILY HISTORY:  Family History  Problem Relation Age of Onset  . Hypertension Mother   . Diabetes Mother   . Hypertension Father   . Stroke Father     DRUG ALLERGIES:  Allergies   Allergen Reactions  . Atorvastatin Other (See Comments)    Dr told her taking these were affecting her liver  . Lipitor [Atorvastatin Calcium]   . Lisinopril Cough  . Penicillins Swelling    Has patient had a PCN reaction causing immediate rash, facial/tongue/throat swelling, SOB or lightheadedness with hypotension: yes Has patient had a PCN reaction causing severe rash involving mucus membranes or skin necrosis: no Has patient had a PCN reaction that required hospitalization no Has patient had a PCN reaction occurring within the last 10 years: no If all of the above answers are "NO", then may proceed with Cephalosporin use.     REVIEW OF SYSTEMS:   CONSTITUTIONAL: No fever, fatigue or weakness.  EYES: No blurred or double vision.  EARS, NOSE, AND THROAT: No tinnitus or ear pain.  RESPIRATORY: No cough, shortness of breath, wheezing or hemoptysis.  CARDIOVASCULAR: No chest pain, orthopnea, edema.  GASTROINTESTINAL: Had nausea, no vomiting, diarrhea or abdominal pain.  GENITOURINARY: No dysuria, hematuria.  ENDOCRINE: No polyuria, nocturia,  HEMATOLOGY: No anemia, easy bruising or bleeding SKIN: No rash or lesion. MUSCULOSKELETAL: No joint pain or arthritis.   NEUROLOGIC: No tingling, numbness, weakness.  Has headache PSYCHIATRY: No anxiety or depression.   MEDICATIONS AT HOME:  Prior to Admission medications   Medication Sig Start  Date End Date Taking? Authorizing Provider  aspirin EC 81 MG tablet Take 81 mg by mouth daily.    [provider]  cholecalciferol (VITAMIN D) 1000 units tablet Take 1,000 Units by mouth daily.    [provider]  clopidogrel (PLAVIX) 75 MG tablet Take 75 mg by mouth daily.    [provider]  cyanocobalamin 2000 MCG tablet Take 2,000 mcg by mouth daily.    [provider]  Dulaglutide (TRULICITY) 0.75 MG/0.5ML SOPN Inject 0.75 mg into the skin once a week.    [provider]  furosemide (LASIX) 40 MG  tablet Take 60 mg by mouth daily as needed.  01/04/15   [provider]  gabapentin (NEURONTIN) 100 MG capsule Take 1 capsule by mouth daily as needed. 09/01/16   [provider]  metoprolol succinate (TOPROL-XL) 100 MG 24 hr tablet Take 150 mg by mouth daily. Need to call pharmacy to verify dosage     [provider]  nitroGLYCERIN (NITROSTAT) 0.4 MG SL tablet Place 1 tablet under the tongue every 5 (five) minutes as needed for chest pain. 03/30/14   [provider]  rosuvastatin (CRESTOR) 20 MG tablet Take 1 tablet by mouth daily.  09/22/15   [provider]      PHYSICAL EXAMINATION:   VITAL SIGNS: Blood pressure (!) 201/97, pulse 60, temperature 98.2 F (36.8 C), temperature source Oral, resp. rate 19, height 5\' 7"  (1.702 m), weight 104.3 kg (230 lb), SpO2 97 %.  GENERAL:  77 y.o.-year-old patient lying in the bed with no acute distress.  EYES: Pupils equal, round, reactive to light and accommodation. No scleral icterus. Extraocular muscles intact.  HEENT: Head atraumatic, normocephalic. Oropharynx and nasopharynx clear.  NECK:  Supple, no jugular venous distention. No thyroid enlargement, no tenderness.  LUNGS: Normal breath sounds bilaterally, no wheezing, rales,rhonchi or crepitation. No use of accessory muscles of respiration.  CARDIOVASCULAR: S1, S2 normal. No murmurs, rubs, or gallops.  ABDOMEN: Soft, nontender, nondistended. Bowel sounds present. No organomegaly or mass.  EXTREMITIES: No pedal edema, cyanosis, or clubbing.  NEUROLOGIC: Cranial nerves II through XII are intact. Muscle strength 5/5 in all extremities. Sensation intact. Gait not checked.  PSYCHIATRIC: The patient is alert and oriented x 3.  SKIN: No obvious rash, lesion, or ulcer.   LABORATORY PANEL:   CBC Recent Labs  Lab 07/22/17 1332 07/26/17 1109  WBC 8.2 6.3  HGB 14.2 14.5  HCT 42.6 44.0  PLT 224 231  MCV 80.2 80.2  MCH 26.7 26.4  MCHC 33.3 32.9  RDW 15.5*  16.0*  LYMPHSABS 3.9*  --   MONOABS 0.8  --   EOSABS 0.4  --   BASOSABS 0.1  --    ------------------------------------------------------------------------------------------------------------------  Chemistries  Recent Labs  Lab 07/22/17 1332 07/26/17 1109  NA 139 141  K 4.4 4.0  CL 106 106  CO2 24 25  GLUCOSE 111* 97  BUN 24* 15  CREATININE 1.20* 1.13*  CALCIUM 9.2 9.2  AST 23  --   ALT 19  --   ALKPHOS 90  --   BILITOT 0.6  --    ------------------------------------------------------------------------------------------------------------------ estimated creatinine clearance is 50.2 mL/min (A) (by C-G formula based on SCr of 1.13 mg/dL (H)). ------------------------------------------------------------------------------------------------------------------ No results for input(s): TSH, T4TOTAL, T3FREE, THYROIDAB in the last 72 hours.  Invalid input(s): FREET3   Coagulation profile Recent Labs  Lab 07/22/17 1332  INR 0.98   ------------------------------------------------------------------------------------------------------------------- No results for input(s): DDIMER in the last  72 hours. -------------------------------------------------------------------------------------------------------------------  Cardiac Enzymes Recent Labs  Lab 07/22/17 1332 07/26/17 1109  TROPONINI <0.03 <0.03   ------------------------------------------------------------------------------------------------------------------ Invalid input(s): POCBNP  ---------------------------------------------------------------------------------------------------------------  Urinalysis    Component Value Date/Time   COLORURINE YELLOW 01/20/2014 1357   APPEARANCEUR CLOUDY 01/20/2014 1357   LABSPEC 1.020 01/20/2014 1357   PHURINE 5.5 01/20/2014 1357   GLUCOSEU NEGATIVE 01/20/2014 1357   HGBUR SMALL 01/20/2014 1357   BILIRUBINUR NEGATIVE 01/20/2014 1357   KETONESUR NEGATIVE 01/20/2014 1357    PROTEINUR 100 mg/dL 16/11/9602 5409   NITRITE NEGATIVE 01/20/2014 1357   LEUKOCYTESUR MODERATE 01/20/2014 1357     RADIOLOGY: Ct Head Wo Contrast  Result Date: 07/26/2017 CLINICAL DATA:  Headache. EXAM: CT HEAD WITHOUT CONTRAST TECHNIQUE: Contiguous axial images were obtained from the base of the skull through the vertex without intravenous contrast. COMPARISON:  Jul 22, 2017 FINDINGS: Brain: No subdural, epidural, or subarachnoid hemorrhage. No mass effect or midline shift. Cerebellum, brainstem, and basal cisterns are normal. Ventricles and sulci are age prominent but stable. White matter changes are moderate to severe and stable. No acute cortical ischemia or infarct. Vascular: No hyperdense vessel or unexpected calcification. Skull: Normal. Negative for fracture or focal lesion. Sinuses/Orbits: No acute finding. Other: None. IMPRESSION: 1. No acute intracranial abnormalities. Moderate to severe white matter changes. Electronically Signed   By: Gerome Sam III M.D   On: 07/26/2017 11:54    EKG: Orders placed or performed during the hospital encounter of 07/26/17  . EKG 12-Lead  . EKG 12-Lead  . ED EKG  . ED EKG  . EKG 12-Lead  . EKG 12-Lead    IMPRESSION AND PLAN:  77 year old female patient with history of pituitary mass, hyperlipidemia, coronary disease, type 2 diabetes mellitus, thyroid nodule presented to the emergency room with headache.  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 Management plans  discussed with the patient, family and they are in agreement.  CODE STATUS:Full code Code Status History    Date Active Date Inactive Code Status Order ID Comments User Context   05/02/2017 0058 05/02/2017 1921 Full Code [id number]  Oralia Manis, MD Inpatient   03/21/2016 1418 03/22/2016 1958 Full Code [id number]  Enedina Finner, MD ED    Advance Directive Documentation     Most Recent Value  Type of Advance Directive  Living will  Pre-existing out of facility DNR order (yellow form or pink MOST form)  -  "MOST" Form in Place?  -       TOTAL TIME TAKING CARE OF THIS PATIENT: 52 minutes.    Ihor Austin M.D on 07/26/2017 at 1:39 PM  Between 7am to 6pm - Pager - [phone]  After 6pm go to [email or web address] - password [email or web address] [facility]  [place]  Office  [phone]  CC: Primary care physician; Olin Hauser

## 2017-07-26 NOTE — ED Triage Notes (Signed)
Pt arrives via ems from home, pt c/o headache to the top and posterior right portion of her head. 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

## 2017-07-26 NOTE — ED Notes (Signed)
Pt in CT.

## 2017-07-26 NOTE — Care Management Obs Status (Signed)
MEDICARE OBSERVATION STATUS NOTIFICATION   Patient Details  Name: Laura Hatfield MRN: [id number] Date of Birth: 1940/11/28   Medicare Observation Status Notification Given:  Yes    Laneice Meneely A, RN 07/26/2017, 5:08 PM

## 2017-07-26 NOTE — ED Provider Notes (Signed)
[facility] Emergency Department Provider Note  ____________________________________________   I have reviewed the triage vital signs and the nursing notes. Where available I have reviewed prior notes and, if possible and indicated, outside hospital notes.    HISTORY  Chief Complaint Headache and Nausea    HPI Laura Hatfield is a 77 y.o. female with a history of CAD, diabetes mellitus, hypertension, who presents today complaining of headache.  Gradual onset, noticed that in the middle the night.  Patient has had this headache several times in the past several months.  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  She states she is afraid to go out now because she has had these headaches several times when she gets these headaches she feels bad.  She states that she is so worried that she will get a headache and she is very worried that the headaches are associated with the small mass that was noted near the pituitary that sometimes she feels that she needs to cancel all her appointments just in case the headache might come on.  In any event, she has a headache at sort of a throbbing discomfort, not worse headache of life mostly gradually in the middle the night.  She denies any change in vision or hearing no focal numbness or weakness, no facial droop, she has no neurologic complaints except for the headache itself.  She states this is the same headache that she has been having over the last month or so, and she is very worried about it.  Nothing makes it better nothing makes it worse, no other associated symptoms, she was seen here 3 days ago with similar complaints and had a negative CT scan of her head at that time.  This time she has no focal neurologic complaint  Past Medical History:  Diagnosis  Date  . CAD (coronary artery disease)   . Diabetes mellitus without complication (HCC)   . Hyperlipidemia   . Thyroid nodule     Patient Active Problem List   Diagnosis Date Noted  . Aphasia 05/01/2017  . HLD (hyperlipidemia) 05/01/2017  . Diabetes (HCC) 05/01/2017  . CAD (coronary artery disease) 05/01/2017  . CKD (chronic kidney disease), stage III (HCC) 05/01/2017  . TIA (transient ischemic attack) 03/21/2016    Past Surgical History:  Procedure Laterality Date  . ANKLE SURGERY    . CHOLECYSTECTOMY    . EYE SURGERY    . HUMERUS SURGERY      Prior to Admission medications   Medication Sig Start Date End Date Taking? Authorizing Provider  aspirin EC 81 MG tablet Take 81 mg by mouth daily.    [provider]  cholecalciferol (VITAMIN D) 1000 units tablet Take 1,000 Units by mouth daily.    [provider]  clopidogrel (PLAVIX) 75 MG tablet Take 75 mg by mouth daily.    [provider]  cyanocobalamin 2000 MCG tablet Take 2,000 mcg by mouth daily.    [provider]  Dulaglutide (TRULICITY) 0.75 MG/0.5ML SOPN Inject 0.75 mg into the skin once a week.    [provider]  furosemide (LASIX) 40 MG tablet Take 60 mg by mouth daily as needed.  01/04/15   [provider]  gabapentin (NEURONTIN) 100 MG capsule Take  1 capsule by mouth daily as needed. 09/01/16   [provider]  metoprolol succinate (TOPROL-XL) 100 MG 24 hr tablet Take 150 mg by mouth daily. Need to call pharmacy to verify dosage     [provider]  nitroGLYCERIN (NITROSTAT) 0.4 MG SL tablet Place 1 tablet under the tongue every 5 (five) minutes as needed for chest pain. 03/30/14   [provider]  rosuvastatin (CRESTOR) 20 MG tablet Take 1 tablet by mouth daily.  09/22/15   [provider]    Allergies Atorvastatin; Lipitor [atorvastatin calcium]; Lisinopril; and Penicillins  Family History  Problem Relation Age of Onset  .  Hypertension Mother   . Diabetes Mother   . Hypertension Father   . Stroke Father     Social History Social History   Tobacco Use  . Smoking status: Never Smoker  . Smokeless tobacco: Never Used  Substance Use Topics  . Alcohol use: No  . Drug use: No    Review of Systems Constitutional: No fever/chills Eyes: No visual changes. ENT: No sore throat. No stiff neck no neck pain Cardiovascular: Denies chest pain. Respiratory: Denies shortness of breath. Gastrointestinal:   no vomiting.  No diarrhea.  No constipation. Genitourinary: Negative for dysuria. Musculoskeletal: Negative lower extremity swelling Skin: Negative for rash. Neurological: Negative for change in her recurrent headaches, focal weakness or numbness.   ____________________________________________   PHYSICAL EXAM:  VITAL SIGNS: ED Triage Vitals  Enc Vitals Group     BP 07/26/17 1101 (!) 189/96     Pulse Rate 07/26/17 1101 65     Resp 07/26/17 1101 16     Temp 07/26/17 1101 98.2 F (36.8 C)     Temp Source 07/26/17 1101 Oral     SpO2 07/26/17 1101 99 %     Weight 07/26/17 1104 230 lb (104.3 kg)     Height 07/26/17 1104 5\' 7"  (1.702 m)     Head Circumference --      Peak Flow --      Pain Score 07/26/17 1100 8     Pain Loc --      Pain Edu? --      Excl. in GC? --     Constitutional: Alert and oriented. Well appearing and in no acute distress.  Appears anxious but not toxic Eyes: Conjunctivae are normal Head: Atraumatic HEENT: No congestion/rhinnorhea. Mucous membranes are moist.  Oropharynx non-erythematous Neck:   Nontender with no meningismus, no masses, no stridor Cardiovascular: Normal rate, regular rhythm. Grossly normal heart sounds.  Good peripheral circulation. Respiratory: Normal respiratory effort.  No retractions. Lungs CTAB. Abdominal: Soft and nontender. No distention. No guarding no rebound Back:  There is no focal tenderness or step off.  there is no midline tenderness there are  no lesions noted. there is no CVA tenderness  Musculoskeletal: No lower extremity tenderness, no upper extremity tenderness. No joint effusions, no DVT signs strong distal pulses no edema Neurologic: Cranial nerves II through XII are grossly intact 5 out of 5 strength bilateral upper and lower extremity. Finger to nose within normal limits heel to shin within normal limits, speech is normal with no word finding difficulty or dysarthria, reflexes symmetric, pupils are equally round and reactive to light, there is no pronator drift, sensation is normal, vision is intact to confrontation, gait is deferred, there is no nystagmus, normal neurologic exam Skin:  Skin is warm, dry and intact. No rash noted. Psychiatric: Mood and affect are quite anxious. Speech and  behavior are normal.  ____________________________________________   LABS (all labs ordered are listed, but only abnormal results are displayed)  Labs Reviewed  BASIC METABOLIC PANEL - Abnormal; Notable for the following components:      Result Value   Creatinine, Ser 1.13 (*)    GFR calc non Af Amer 45 (*)    GFR calc Af Amer 52 (*)    All other components within normal limits  CBC - Abnormal; Notable for the following components:   RBC 5.48 (*)    RDW 16.0 (*)    All other components within normal limits  GLUCOSE, CAPILLARY  TROPONIN I  URINALYSIS, COMPLETE (UACMP) WITH MICROSCOPIC  CBG MONITORING, ED    Pertinent labs  results that were available during my care of the patient were reviewed by me and considered in my medical decision making (see chart for details). ____________________________________________  EKG  I personally interpreted any EKGs ordered by me or triage Normal sinus rhythm rate 66 bpm no acute ST elevation or depression normal axis unremarkable EKG ____________________________________________  RADIOLOGY  Pertinent labs & imaging results that were available during my care of the patient were reviewed by  me and considered in my medical decision making (see chart for details). If possible, patient and/or family made aware of any abnormal findings.  Ct Head Wo Contrast  Result Date: 07/26/2017 CLINICAL DATA:  Headache. EXAM: CT HEAD WITHOUT CONTRAST TECHNIQUE: Contiguous axial images were obtained from the base of the skull through the vertex without intravenous contrast. COMPARISON:  Jul 22, 2017 FINDINGS: Brain: No subdural, epidural, or subarachnoid hemorrhage. No mass effect or midline shift. Cerebellum, brainstem, and basal cisterns are normal. Ventricles and sulci are age prominent but stable. White matter changes are moderate to severe and stable. No acute cortical ischemia or infarct. Vascular: No hyperdense vessel or unexpected calcification. Skull: Normal. Negative for fracture or focal lesion. Sinuses/Orbits: No acute finding. Other: None. IMPRESSION: 1. No acute intracranial abnormalities. Moderate to severe white matter changes. Electronically Signed   By: Gerome Sam III M.D   On: 07/26/2017 11:54   ____________________________________________    PROCEDURES  Procedure(s) performed: None  Procedures  Critical Care performed: None  ____________________________________________   INITIAL IMPRESSION / ASSESSMENT AND PLAN / ED COURSE  Pertinent labs & imaging results that were available during my care of the patient were reviewed by me and considered in my medical decision making (see chart for details).  Patient reporting repeated headache.  Review of prior MRI which I personally looked at shows a small pituitary area lesion which is unchanged over the last few years.  Most recent MRI was less than 3 months ago at which time she was having some word finding difficulty.  That MRI was negative.  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  Time she has a very reassuring neurologic exam.  I have given her some pain medication which I  hope will serve as an anxiolytic as well as a analgesia.  Patient is very anxious about her headaches.  Though she has had multiple different scans, I did do a CT scan of her head which is reassuringly negative I did this because she is on Plavix and she complains of severe headache.  The recurrent nature of her headaches is reassuring.  Patient does have follow-up with neurology and has been followed with them they did not give her any recommendations she states for her headaches.  She has a considerable amount of stress about her headaches used to be interfering with her ability to perform her daily tasks, more than the headaches themselves are.  Her CT scan is reassuringly negative, and she is feeling somewhat better after the medication.  Her blood pressure remains elevated but it is difficult to know if that is because of anxiety and pain causing blood pressure to be elevated her blood pressure elevation is causing pain and therefore anxiety.  We will continue to watch this.      ____________________________________________   FINAL CLINICAL IMPRESSION(S) / ED DIAGNOSES  Final diagnoses:  None      This chart was dictated using voice recognition software.  Despite best efforts to proofread,  errors can occur which can change meaning.      Jeanmarie Plant, MD 07/26/17 1218

## 2017-07-26 NOTE — Care Management Note (Addendum)
Case Management Note  Patient Details  Name: Laura Hatfield MRN: [id number] Date of Birth: 12-19-1940  Subjective/Objective:          Patient admitted to [facility] under observation status for hypertensive urgency. Patient lives alone but her Laura Hatfield and cousin Laura Hatfield [phone] are available to assist when needed. Patient is current independent with activities of daily living and ambulation but does require a cane. No other DME. [facility]bt[facility] medications [facility] in [place]. No transportation issues, still drives. No falls. Used [facility] via [facility] in the past. PCP is  Dr Abbe AmsterdamPeyser with [facility].       Action/Plan:  No anticipated RNCM  Needs but will continue to follow. Expected Discharge Date:                  Expected Discharge Plan:     In-House Referral:     Discharge planning Services     Post Acute Care Choice:    Choice offered to:     DME Arranged:    DME Agency:     HH Arranged:    HH Agency:     Status of Service:     If discussed at [employer] Meetings, dates discussed:    Additional Comments:  Taygen Newsome A, RN 07/26/2017, 5:24 PM

## 2017-07-27 LAB — GLUCOSE, CAPILLARY: Glucose-Capillary: 205 mg/dL — ABNORMAL HIGH (ref 65–99)

## 2017-07-27 NOTE — Discharge Summary (Signed)
[facility] [place] [facility]   PATIENT NAME: Laura SacramentoShirley Hatfield    MR#:  [id number]  DATE OF BIRTH:  [birth year]  DATE OF ADMISSION:  07/26/2017 ADMITTING PHYSICIAN: Ihor AustinPavan Pyreddy, MD  DATE OF DISCHARGE: 07/27/2017  PRIMARY CARE PHYSICIAN: Peyser, Bruce    ADMISSION DIAGNOSIS:  Nonintractable headache, unspecified chronicity pattern, unspecified headache type [R51]  DISCHARGE DIAGNOSIS:  Active Problems:   Hypertensive urgency   SECONDARY DIAGNOSIS:   Past Medical History:  Diagnosis Date  . CAD (coronary artery disease)   . Diabetes mellitus without complication (HCC)   . Hyperlipidemia   . 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  1.  Hypertensive urgency: This is etiology of patient's headache.  Her headache has resolved.  Blood pressure is controlled.  She will continue with outpatient medications and follow-up with her PCP.  2.  Headache: This is improved.  I suspect this is related to her elevated blood pressure.  CT head in the emergency room showed no acute abnormality. She will follow-up with her neurologist Dr. Clelia CroftShaw within this week. 3.  Known pituitary mass: Patient is followed by Dr. Allison Quarryobb with neurosurgery and she has a follow-up this month with him.  4.  Diabetes: Patient will continue Trulicity as well as ADA diet.  DISCHARGE CONDITIONS AND DIET:  Stable for discharge on heart healthy diet/diabetic diet  CONSULTS OBTAINED:    DRUG ALLERGIES:   Allergies  Allergen Reactions  . Atorvastatin Other (See Comments)    Dr told her taking these were affecting her liver  . Lipitor [Atorvastatin Calcium]   . Lisinopril Cough  . Penicillins Swelling    Has patient had a PCN reaction causing immediate rash, facial/tongue/throat swelling, SOB or lightheadedness with hypotension: yes Has patient had a PCN reaction causing  severe rash involving mucus membranes or skin necrosis: no Has patient had a PCN reaction that required hospitalization no Has patient had a PCN reaction occurring within the last 10 years: no If all of the above answers are "NO", then may proceed with Cephalosporin use.     DISCHARGE MEDICATIONS:   Allergies as of 07/27/2017      Reactions   Atorvastatin Other (See Comments)   Dr told her taking these were affecting her liver   Lipitor [atorvastatin Calcium]    Lisinopril Cough   Penicillins Swelling   Has patient had a PCN reaction causing immediate rash, facial/tongue/throat swelling, SOB or lightheadedness with hypotension: yes Has patient had a PCN reaction causing severe rash involving mucus membranes or skin necrosis: no Has patient had a PCN reaction that required hospitalization no Has patient had a PCN reaction occurring within the last 10 years: no If all of the above answers are "NO", then may proceed with Cephalosporin use.      Medication List    TAKE these medications   aspirin EC 81 MG tablet Take 81 mg by mouth daily.   cholecalciferol 1000 units tablet Commonly known as:  VITAMIN D Take 1,000 Units by mouth daily.   clopidogrel 75 MG tablet Commonly known as:  PLAVIX Take 75 mg by mouth daily.   furosemide 40 MG tablet Commonly known as:  LASIX Take 60 mg by mouth daily as needed for fluid.   gabapentin 100 MG capsule Commonly known as:  NEURONTIN Take 1 capsule by mouth at bedtime as needed (neuropathy).  metoprolol succinate 100 MG 24 hr tablet Commonly known as:  TOPROL-XL Take 150 mg by mouth daily.   nitroGLYCERIN 0.4 MG SL tablet Commonly known as:  NITROSTAT Place 1 tablet under the tongue every 5 (five) minutes as needed for chest pain.   rosuvastatin 40 MG tablet Commonly known as:  CRESTOR Take 20 mg by mouth daily.   TRULICITY 0.75 MG/0.5ML Sopn Generic drug:  Dulaglutide Inject 0.75 mg into the skin once a week. Every Sunday          Today   CHIEF COMPLAINT:  Headache is resolved.   VITAL SIGNS:  Blood pressure (!) 111/56, pulse (!) 59, temperature 98.4 F (36.9 C), temperature source Oral, resp. rate 18, height 5\' 7"  (1.702 m), weight 103.2 kg (227 lb 9.6 oz), SpO2 90 %.   REVIEW OF SYSTEMS:  Review of Systems  Constitutional: Negative.  Negative for chills, fever and malaise/fatigue.  HENT: Negative.  Negative for ear discharge, ear pain, hearing loss, nosebleeds and sore throat.   Eyes: Negative.  Negative for blurred vision and pain.  Respiratory: Negative.  Negative for cough, hemoptysis, shortness of breath and wheezing.   Cardiovascular: Negative.  Negative for chest pain, palpitations and leg swelling.  Gastrointestinal: Negative.  Negative for abdominal pain, blood in stool, diarrhea, nausea and vomiting.  Genitourinary: Negative.  Negative for dysuria.  Musculoskeletal: Negative.  Negative for back pain.  Skin: Negative.   Neurological: Negative for dizziness, tremors, speech change, focal weakness, seizures and headaches.  Endo/Heme/Allergies: Negative.  Does not bruise/bleed easily.  Psychiatric/Behavioral: Negative.  Negative for depression, hallucinations and suicidal ideas.     PHYSICAL EXAMINATION:  GENERAL:  77 y.o.-year-old patient lying in the bed with no acute distress.  NECK:  Supple, no jugular venous distention. No thyroid enlargement, no tenderness.  LUNGS: Normal breath sounds bilaterally, no wheezing, rales,rhonchi  No use of accessory muscles of respiration.  CARDIOVASCULAR: S1, S2 normal. No murmurs, rubs, or gallops.  ABDOMEN: Soft, non-tender, non-distended. Bowel sounds present. No organomegaly or mass.  EXTREMITIES: No pedal edema, cyanosis, or clubbing.  PSYCHIATRIC: The patient is alert and oriented x 3.  SKIN: No obvious rash, lesion, or ulcer.   DATA REVIEW:   CBC Recent Labs  Lab 07/26/17 1109  WBC 6.3  HGB 14.5  HCT 44.0  PLT 231    Chemistries   Recent Labs  Lab 07/22/17 1332 07/26/17 1109  NA 139 141  K 4.4 4.0  CL 106 106  CO2 24 25  GLUCOSE 111* 97  BUN 24* 15  CREATININE 1.20* 1.13*  CALCIUM 9.2 9.2  AST 23  --   ALT 19  --   ALKPHOS 90  --   BILITOT 0.6  --     Cardiac Enzymes Recent Labs  Lab 07/22/17 1332 07/26/17 1109  TROPONINI <0.03 <0.03    Microbiology Results  @[email or web address] @  RADIOLOGY:  Ct Head Wo Contrast  Result Date: 07/26/2017 CLINICAL DATA:  Headache. EXAM: CT HEAD WITHOUT CONTRAST TECHNIQUE: Contiguous axial images were obtained from the base of the skull through the vertex without intravenous contrast. COMPARISON:  Jul 22, 2017 FINDINGS: Brain: No subdural, epidural, or subarachnoid hemorrhage. No mass effect or midline shift. Cerebellum, brainstem, and basal cisterns are normal. Ventricles and sulci are age prominent but stable. White matter changes are moderate to severe and stable. No acute cortical ischemia or infarct. Vascular: No hyperdense vessel or unexpected calcification. Skull: Normal. Negative for fracture or focal lesion. Sinuses/Orbits: No acute finding.  Other: None. IMPRESSION: 1. No acute intracranial abnormalities. Moderate to severe white matter changes. Electronically Signed   By: Gerome Sam III M.D   On: 07/26/2017 11:54      Allergies as of 07/27/2017      Reactions   Atorvastatin Other (See Comments)   Dr told her taking these were affecting her liver   Lipitor [atorvastatin Calcium]    Lisinopril Cough   Penicillins Swelling   Has patient had a PCN reaction causing immediate rash, facial/tongue/throat swelling, SOB or lightheadedness with hypotension: yes Has patient had a PCN reaction causing severe rash involving mucus membranes or skin necrosis: no Has patient had a PCN reaction that required hospitalization no Has patient had a PCN reaction occurring within the last 10 years: no If all of the above answers are "NO", then may proceed with Cephalosporin use.       Medication List    TAKE these medications   aspirin EC 81 MG tablet Take 81 mg by mouth daily.   cholecalciferol 1000 units tablet Commonly known as:  VITAMIN D Take 1,000 Units by mouth daily.   clopidogrel 75 MG tablet Commonly known as:  PLAVIX Take 75 mg by mouth daily.   furosemide 40 MG tablet Commonly known as:  LASIX Take 60 mg by mouth daily as needed for fluid.   gabapentin 100 MG capsule Commonly known as:  NEURONTIN Take 1 capsule by mouth at bedtime as needed (neuropathy).   metoprolol succinate 100 MG 24 hr tablet Commonly known as:  TOPROL-XL Take 150 mg by mouth daily.   nitroGLYCERIN 0.4 MG SL tablet Commonly known as:  NITROSTAT Place 1 tablet under the tongue every 5 (five) minutes as needed for chest pain.   rosuvastatin 40 MG tablet Commonly known as:  CRESTOR Take 20 mg by mouth daily.   TRULICITY 0.75 MG/0.5ML Sopn Generic drug:  Dulaglutide Inject 0.75 mg into the skin once a week. Every Sunday         Management plans discussed with the patient and she is in agreement. Stable for discharge home  Patient should follow up with pcp  CODE STATUS:     Code Status Orders  (From admission, onward)        Start     Ordered   07/26/17 1606  Full code  Continuous     06 /02/19 1605    Code Status History    Date Active Date Inactive Code Status Order ID Comments User Context   05/02/2017 0058 05/02/2017 1921 Full Code [id number]  Oralia Manis, MD Inpatient   03/21/2016 1418 03/22/2016 1958 Full Code [id number]  Enedina Finner, MD ED    Advance Directive Documentation     Most Recent Value  Type of Advance Directive  Living will  Pre-existing out of facility DNR order (yellow form or pink MOST form)  -  "MOST" Form in Place?  -      TOTAL TIME TAKING CARE OF THIS PATIENT: 38 minutes.    Note: This dictation was prepared with Dragon dictation along with smaller phrase technology. Any transcriptional errors that result from this process  are unintentional.  Kaemon Barnett M.D on 07/27/2017 at 11:05 AM  Between 7am to 6pm - Pager - [phone] After 6pm go to [email or web address] - Social research officer, government  [facility]  Office  [phone]  CC: Primary care physician; Olin Hauser

## 2017-07-27 NOTE — Discharge Instructions (Signed)
Cluster Headache Cluster headaches are deeply painful. They normally occur on one side of your head, but they may switch sides. Often, cluster headaches:  Are severe.  Happen often for a few weeks or months and then go away for a while.  Last from 15 minutes to 3 hours.  Happen at the same time each day.  Happen at night.  Happen many times a day.  Follow these instructions at home: Follow instructions from your doctor to care for yourself at home:  Go to bed at the same time each night. Get the same amount of sleep every night.  Avoid alcohol.  Stop smoking if you smoke. This includes cigarettes and e-cigarettes.  Take over-the-counter and prescription medicines only as told by your doctor.  Do not drive or use heavy machinery while taking prescription pain medicine.  Use oxygen as told by your doctor.  Exercise regularly.  Eat a healthy diet.  Write down when each headache happened, what kind of pain you had, how bad your pain was, and what you tried to help your pain. This is called a headache diary. Use it as told by your doctor.  Contact a doctor if:  Your headaches get worse or they happen more often.  Your medicines are not helping. Get help right away if:  You pass out (faint).  You get weak or lose feeling (have numbness) on one side of your body or face.  You see two of everything (double vision).  You feel sick to your stomach (nauseous) or you throw up (vomit), and you do not stop after many hours.  You have trouble with your balance or with walking.  You have trouble talking.  You have neck pain or stiffness.  You have a fever. This information is not intended to replace advice given to you by your health care provider. Make sure you discuss any questions you have with your health care provider. Document Released: 03/20/2004 Document Revised: 10/19/2015 Document Reviewed: 10/19/2015 Elsevier Interactive Patient Education  2017 Elsevier  Inc.  

## 2017-11-30 ENCOUNTER — Encounter: Payer: Self-pay | Admitting: *Deleted

## 2017-11-30 ENCOUNTER — Other Ambulatory Visit: Payer: Self-pay

## 2017-11-30 ENCOUNTER — Emergency Department: Payer: Medicare HMO

## 2017-11-30 ENCOUNTER — Emergency Department
Admission: EM | Admit: 2017-11-30 | Discharge: 2017-11-30 | Disposition: A | Discharge status: Home / Self Care | Payer: Medicare HMO | Attending: Emergency Medicine | Admitting: Emergency Medicine

## 2017-11-30 DIAGNOSIS — E1122 Type 2 diabetes mellitus with diabetic chronic kidney disease: Secondary | ICD-10-CM | POA: Diagnosis not present

## 2017-11-30 DIAGNOSIS — R4701 Aphasia: Secondary | ICD-10-CM | POA: Diagnosis present

## 2017-11-30 DIAGNOSIS — Z79899 Other long term (current) drug therapy: Secondary | ICD-10-CM | POA: Insufficient documentation

## 2017-11-30 DIAGNOSIS — I129 Hypertensive chronic kidney disease with stage 1 through stage 4 chronic kidney disease, or unspecified chronic kidney disease: Secondary | ICD-10-CM | POA: Insufficient documentation

## 2017-11-30 DIAGNOSIS — Z794 Long term (current) use of insulin: Secondary | ICD-10-CM | POA: Insufficient documentation

## 2017-11-30 DIAGNOSIS — N183 Chronic kidney disease, stage 3 (moderate): Secondary | ICD-10-CM | POA: Diagnosis not present

## 2017-11-30 DIAGNOSIS — Z8673 Personal history of transient ischemic attack (TIA), and cerebral infarction without residual deficits: Secondary | ICD-10-CM | POA: Diagnosis not present

## 2017-11-30 DIAGNOSIS — Z7902 Long term (current) use of antithrombotics/antiplatelets: Secondary | ICD-10-CM | POA: Diagnosis not present

## 2017-11-30 DIAGNOSIS — G43109 Migraine with aura, not intractable, without status migrainosus: Secondary | ICD-10-CM

## 2017-11-30 DIAGNOSIS — I2510 Atherosclerotic heart disease of native coronary artery without angina pectoris: Secondary | ICD-10-CM | POA: Insufficient documentation

## 2017-11-30 LAB — DIFFERENTIAL
Basophils Absolute: 0.2 K/uL — ABNORMAL HIGH (ref 0–0.1)
Basophils Relative: 2 %
Eosinophils Absolute: 0.3 K/uL (ref 0–0.7)
Eosinophils Relative: 3 %
Lymphocytes Relative: 48 %
Lymphs Abs: 4.7 K/uL — ABNORMAL HIGH (ref 1.0–3.6)
Monocytes Absolute: 0.7 K/uL (ref 0.2–0.9)
Monocytes Relative: 8 %
Neutro Abs: 3.9 K/uL (ref 1.4–6.5)
Neutrophils Relative %: 39 %

## 2017-11-30 LAB — CBC
HCT: 47.8 % — ABNORMAL HIGH (ref 35.0–47.0)
Hemoglobin: 15.6 g/dL (ref 12.0–16.0)
MCH: 26 pg (ref 26.0–34.0)
MCHC: 32.7 g/dL (ref 32.0–36.0)
MCV: 79.4 fL — ABNORMAL LOW (ref 80.0–100.0)
Platelets: 256 K/uL (ref 150–440)
RBC: 6.02 MIL/uL — ABNORMAL HIGH (ref 3.80–5.20)
RDW: 15.8 % — ABNORMAL HIGH (ref 11.5–14.5)
WBC: 9.8 K/uL (ref 3.6–11.0)

## 2017-11-30 LAB — COMPREHENSIVE METABOLIC PANEL WITH GFR
ALT: 25 U/L (ref 0–44)
AST: 28 U/L (ref 15–41)
Albumin: 4.4 g/dL (ref 3.5–5.0)
Alkaline Phosphatase: 115 U/L (ref 38–126)
Anion gap: 10 (ref 5–15)
BUN: 24 mg/dL — ABNORMAL HIGH (ref 8–23)
CO2: 27 mmol/L (ref 22–32)
Calcium: 10 mg/dL (ref 8.9–10.3)
Chloride: 102 mmol/L (ref 98–111)
Creatinine, Ser: 1.46 mg/dL — ABNORMAL HIGH (ref 0.44–1.00)
GFR calc Af Amer: 38 mL/min — ABNORMAL LOW
GFR calc non Af Amer: 33 mL/min — ABNORMAL LOW
Glucose, Bld: 200 mg/dL — ABNORMAL HIGH (ref 70–99)
Potassium: 4.3 mmol/L (ref 3.5–5.1)
Sodium: 139 mmol/L (ref 135–145)
Total Bilirubin: 0.5 mg/dL (ref 0.3–1.2)
Total Protein: 8.2 g/dL — ABNORMAL HIGH (ref 6.5–8.1)

## 2017-11-30 LAB — GLUCOSE, CAPILLARY: Glucose-Capillary: 177 mg/dL — ABNORMAL HIGH (ref 70–99)

## 2017-11-30 LAB — TROPONIN I

## 2017-11-30 LAB — APTT: APTT: 35 s (ref 24–36)

## 2017-11-30 LAB — PROTIME-INR
INR: 0.88
Prothrombin Time: 11.9 seconds (ref 11.4–15.2)

## 2017-11-30 MED ORDER — ACETAMINOPHEN 500 MG PO TABS
1000.0000 mg | ORAL_TABLET | Freq: Once | ORAL | Status: AC
  Administered 2017-11-30: 1000 mg via ORAL

## 2017-11-30 MED ORDER — ACETAMINOPHEN 500 MG PO TABS
ORAL_TABLET | ORAL | Status: AC
  Filled 2017-11-30: qty 2

## 2017-11-30 NOTE — ED Notes (Signed)
Neurologist called and information given to RN Kimmie

## 2017-11-30 NOTE — ED Notes (Signed)
Pt in room at this time. Neurologist Suradi MD on screen at this time. Neurologist Lanora Manis at bedside. Pt speaking with MD at this time.

## 2017-11-30 NOTE — ED Provider Notes (Signed)
[facility] Emergency Department Provider Note ____________________________________________   I have reviewed the triage vital signs and the triage nursing note.  HISTORY  Chief Complaint Aphasia and Code Stroke   Historian Patient  HPI Laura Hatfield is a 77 y.o. female with a history of diabetes and coronary disease as well as complex migraine associated with aphasia episodes, presents the emergency department with expressive aphasia associated with headache today, just prior to arrival.  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  She took an Aleve and came here.  Headache is almost gone now.  Aphasia is now resolved.  No chest pain or trouble breathing.     Past Medical History:  Diagnosis Date  . CAD (coronary artery disease)   . Diabetes mellitus without complication (HCC)   . Hyperlipidemia   . Thyroid nodule     Patient Active Problem List   Diagnosis Date Noted  . Hypertensive urgency 07/26/2017  . Aphasia 05/01/2017  . HLD (hyperlipidemia) 05/01/2017  . Diabetes (HCC) 05/01/2017  . CAD (coronary artery disease) 05/01/2017  . CKD (chronic kidney disease), stage III (HCC) 05/01/2017  . TIA (transient ischemic attack) 03/21/2016    Past Surgical History:  Procedure Laterality Date  . ANKLE SURGERY    . CHOLECYSTECTOMY    . EYE SURGERY    . HUMERUS SURGERY      Prior to Admission medications   Medication Sig Start Date End Date Taking? Authorizing Provider  aspirin 325 MG tablet Take 325 mg by mouth at bedtime.   Yes [provider]  cholecalciferol (VITAMIN D) 1000 units tablet Take 1,000 Units by mouth at bedtime.    Yes [provider]  clopidogrel (PLAVIX) 75 MG tablet Take 75 mg by mouth at bedtime.    Yes [provider]  Dulaglutide  (TRULICITY) 0.75 MG/0.5ML SOPN Inject 0.75 mg into the skin every Sunday.    Yes [provider]  furosemide (LASIX) 40 MG tablet Take 60 mg by mouth daily as needed for fluid.  01/04/15  Yes [provider]  gabapentin (NEURONTIN) 100 MG capsule Take 100 mg by mouth at bedtime as needed (for pain).    Yes [provider]  insulin NPH-regular Human (NOVOLIN 70/30) (70-30) 100 UNIT/ML injection Inject 25-30 Units into the skin See admin instructions. 30 units every morning and 25 units every evening   Yes [provider]  losartan (COZAAR) 25 MG tablet Take 25 mg by mouth at bedtime.   Yes [provider]  metoprolol succinate (TOPROL-XL) 100 MG 24 hr tablet Take 150 mg by mouth at bedtime.    Yes [provider]  nitroGLYCERIN (NITROSTAT) 0.4 MG SL tablet Place 1 tablet under the tongue every 5 (five) minutes as needed for chest pain. 03/30/14  Yes [provider]  rosuvastatin (CRESTOR) 40 MG tablet Take 20 mg by mouth at bedtime.    Yes [provider]  vitamin B-12 (CYANOCOBALAMIN) 1000 MCG tablet Take 2,000 mcg by mouth at bedtime.   Yes [provider]    Allergies  Allergen Reactions  . Atorvastatin Other (See Comments)    Dr told her taking these were affecting her liver  . Lipitor [Atorvastatin Calcium]   . Lisinopril Cough  . Penicillins Swelling    Has patient had a PCN  reaction causing immediate rash, facial/tongue/throat swelling, SOB or lightheadedness with hypotension: yes Has patient had a PCN reaction causing severe rash involving mucus membranes or skin necrosis: no Has patient had a PCN reaction that required hospitalization no Has patient had a PCN reaction occurring within the last 10 years: no If all of the above answers are "NO", then may proceed with Cephalosporin use.     Family History  Problem Relation Age of Onset  . Hypertension Mother   . Diabetes Mother   . Hypertension Father    . Stroke Father     Social History Social History   Tobacco Use  . Smoking status: Never Smoker  . Smokeless tobacco: Never Used  Substance Use Topics  . Alcohol use: No  . Drug use: No    Review of Systems  Constitutional: Negative for fever. Eyes: Negative for visual changes. ENT: Negative for sore throat. Cardiovascular: Negative for chest pain. Respiratory: Negative for shortness of breath. Gastrointestinal: Negative for abdominal pain, vomiting and diarrhea. Genitourinary: Negative for dysuria. Musculoskeletal: Negative for back pain. Skin: Negative for rash. Neurological: Positive for headache.  ____________________________________________   PHYSICAL EXAM:  VITAL SIGNS: ED Triage Vitals  Enc Vitals Group     BP 11/30/17 1547 (!) 160/85     Pulse Rate 11/30/17 1547 84     Resp 11/30/17 1547 20     Temp 11/30/17 1547 98.4 F (36.9 C)     Temp Source 11/30/17 1547 Oral     SpO2 11/30/17 1547 100 %     Weight 11/30/17 1548 235 lb (106.6 kg)     Height 11/30/17 1548 5\' 7"  (1.702 m)     Head Circumference --      Peak Flow --      Pain Score 11/30/17 1548 8     Pain Loc --      Pain Edu? --      Excl. in GC? --      Constitutional: Alert and oriented.  HEENT      Head: Normocephalic and atraumatic.      Eyes: Conjunctivae are normal. Pupils equal and round.       Ears:         Nose: No congestion/rhinnorhea.      Mouth/Throat: Mucous membranes are moist.      Neck: No stridor. Cardiovascular/Chest: Normal rate, regular rhythm.  No murmurs, rubs, or gallops. Respiratory: Normal respiratory effort without tachypnea nor retractions. Breath sounds are clear and equal bilaterally. No wheezes/rales/rhonchi. Gastrointestinal: Soft. No distention, no guarding, no rebound. Nontender.    Genitourinary/rectal:Deferred Musculoskeletal: Nontender with normal range of motion in all extremities. No joint effusions.  No lower extremity tenderness.  No  edema. Neurologic: No facial droop.  Normal speech and language. No gross or focal neurologic deficits are appreciated. Skin:  Skin is warm, dry and intact. No rash noted. Psychiatric: Mood and affect are normal. Speech and behavior are normal. Patient exhibits appropriate insight and judgment.   ____________________________________________  LABS (pertinent positives/negatives) I, Governor Rooks, MD the attending physician have reviewed the labs noted below.  Labs Reviewed  CBC - Abnormal; Notable for the following components:      Result Value   RBC 6.02 (*)    HCT 47.8 (*)    MCV 79.4 (*)    RDW 15.8 (*)    All other components within normal limits  DIFFERENTIAL - Abnormal; Notable for the following components:   Lymphs Abs 4.7 (*)  Basophils Absolute 0.2 (*)    All other components within normal limits  COMPREHENSIVE METABOLIC PANEL - Abnormal; Notable for the following components:   Glucose, Bld 200 (*)    BUN 24 (*)    Creatinine, Ser 1.46 (*)    Total Protein 8.2 (*)    GFR calc non Af Amer 33 (*)    GFR calc Af Amer 38 (*)    All other components within normal limits  GLUCOSE, CAPILLARY - Abnormal; Notable for the following components:   Glucose-Capillary 177 (*)    All other components within normal limits  PROTIME-INR  APTT  TROPONIN I  CBG MONITORING, ED    ____________________________________________    EKG I, Governor Rooks, MD, the attending physician have personally viewed and interpreted all ECGs.  82 beats per minute.  Normal sinus rhythm.  Narrow QS renal axis.  Normal ST and T wave ____________________________________________  RADIOLOGY   Ct head without contrast:  IMPRESSION: 1. No acute intracranial abnormality. 2. Stable moderate to severe atrophy and white matter disease, likely reflecting the sequela of chronic microvascular ischemia. 3. ASPECTS is 10/10 __________________________________________  PROCEDURES  Procedure(s) performed:  None  Procedures  Critical Care performed: CRITICAL CARE Performed by: Governor Rooks   Total critical care time: 30 minutes  Critical care time was exclusive of separately billable procedures and treating other patients.  Critical care was necessary to treat or prevent imminent or life-threatening deterioration.  Critical care was time spent personally by me on the following activities: development of treatment plan with patient and/or surrogate as well as nursing, discussions with consultants, evaluation of patient's response to treatment, examination of patient, obtaining history from patient or surrogate, ordering and performing treatments and interventions, ordering and review of laboratory studies, ordering and review of radiographic studies, pulse oximetry and re-evaluation of patient's condition.    ____________________________________________  ED COURSE / ASSESSMENT AND PLAN  Pertinent labs & imaging results that were available during my care of the patient were reviewed by me and considered in my medical decision making (see chart for details).    Patient had an episode of aphasia associated with headache similar to prior episodes which have been determined to be consistent with complex migraine.  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  Tele-neurology was consulted and feels this is most consistent with complex migraine rather than TIA/stroke.  CT head is negative.  Headache is essentially resolved now.  We will plan on discharge home.  Recommend follow-up with neurology.   Mild incr bun and cr, patient able to take po, can rehydrate at home.   CONSULTATIONS: Tele-neurology.   Patient / Family / Caregiver informed of clinical course, medical decision-making process, and agree with  plan.   I discussed return precautions, follow-up instructions, and discharge instructions with patient and/or family.  Discharge Instructions : You are evaluated for headache and speech problem which is suspected to be related to complex or complicated migraine as you have had previously.  Your blood work showed mild dehydration, make sure you drink plenty fluids.  Return to the emergency room immediately for any worsening condition including weakness, numbness, confusion altered mental status, new or worsening headache, or any other symptoms concerning to you.    ___________________________________________   FINAL CLINICAL IMPRESSION(S) / ED DIAGNOSES  Final diagnoses:  Complicated migraine      ___________________________________________         Note: This dictation was prepared with Dragon dictation. Any transcriptional errors that result from this process are unintentional    Governor Rooks, MD 11/30/17 1715

## 2017-11-30 NOTE — Consult Note (Signed)
   [facility] TeleNeurology Consult Services    Date of Service:   11/30/2017 15:56:32  Impression:      .  complex headache  Comments: headache management. May need to consider headache prophy as happened many times now. Secondary stroke prevention.  Metrics: Last Known Well: 11/30/2017 15:00:00 [facility] Notification Time: 11/30/2017 15:55:34 Arrival Time: 11/30/2017 15:45:00 Stamp Time: 11/30/2017 15:56:32 Time First Login Attempt: 11/30/2017 16:00:00 Video Start Time: 11/30/2017 16:00:00  Symptoms: headache and speech problem NIHSS Start Assessment Time: 11/30/2017 16:05:00 Patient is not a candidate for tPA. Patient was not deemed candidate for tPA thrombolytics because of NIHSS 0. Video End Time: 11/30/2017 16:15:22  CT head showed no acute hemorrhage or acute core infarct. CT head was reviewed.  Advanced imaging was not obtained as the presentation was not suggestive of Large Vessel Occlusive Disease.  ER physician notified of the decision on thrombolytics management.  Sign Out:     .  Discussed with Emergency Department Provider    ------------------------------------------------------------------------------  History of Present Illness:  Patient is a 77 years old Female.  Patient was brought by private transportation with symptoms of headache and speech problem  Patient drove herself here as she said she started having a headache and speech difficulty at 1500 today, she was speaking on the phone and was having hard time expressing herself. Now resolved and has only mild headache, she said this happened to her now 4-5 times.  CT head showed no acute hemorrhage or acute core infarct. CT head was reviewed.    Examination:  1A: Level of Consciousness - Alert; keenly responsive + 0 1B: Ask Month and Age - Both Questions Right + 0 1C: Blink Eyes & Squeeze Hands - Performs Both Tasks + 0 2: Test Horizontal Extraocular Movements - Normal + 0 3: Test  Visual Fields - No Visual Loss + 0 4: Test Facial Palsy (Use Grimace if Obtunded) - Normal symmetry + 0 5A: Test Left Arm Motor Drift - No Drift for 10 Seconds + 0 5B: Test Right Arm Motor Drift - No Drift for 10 Seconds + 0 6A: Test Left Leg Motor Drift - No Drift for 5 Seconds + 0 6B: Test Right Leg Motor Drift - No Drift for 5 Seconds + 0 7: Test Limb Ataxia (FNF/Heel-Shin) - No Ataxia + 0 8: Test Sensation - Normal; No sensory loss + 0 9: Test Language/Aphasia - Normal; No aphasia + 0 10: Test Dysarthria - Normal + 0 11: Test Extinction/Inattention - No abnormality + 0  NIHSS Score: 0  Patient was informed the Neurology Consult would happen via TeleHealth consult by way of interactive audio and video telecommunications and consented to receiving care in this manner.  Due to the immediate potential for life-threatening deterioration due to underlying acute neurologic illness, I spent 35 minutes providing critical care. This time includes time for face to face visit via telemedicine, review of medical records, imaging studies and discussion of findings with providers, the patient and/or family.   Dr Lorrin Goodell   [facility] [phone]

## 2017-11-30 NOTE — ED Notes (Signed)
MD Lord at bedside. 

## 2017-11-30 NOTE — ED Triage Notes (Signed)
Pt to triage via wheelchair.  Pt has a headache and having difficulty speaking.  Sx began 1500 today.  Pt alert.

## 2017-11-30 NOTE — ED Notes (Signed)
Pt in CT.

## 2017-11-30 NOTE — Progress Notes (Signed)
   11/30/17 1600  Clinical Encounter Type  Visited With Patient  Visit Type Code  Referral From [place] responded to Code Stroke and met patient outside of ED 14. Chaplain walked with patient to CT and prayed for her while she was preparing for the test. Patient is frightened and unsure of what is happening to her. Chaplain offered presence and prayer.

## 2017-11-30 NOTE — Discharge Instructions (Addendum)
You are evaluated for headache and speech problem which is suspected to be related to complex or complicated migraine as you have had previously.  Your blood work showed mild dehydration, make sure you drink plenty fluids.  Return to the emergency room immediately for any worsening condition including weakness, numbness, confusion altered mental status, new or worsening headache, or any other symptoms concerning to you.

## 2018-02-02 ENCOUNTER — Ambulatory Visit
Admission: EM | Admit: 2018-02-02 | Discharge: 2018-02-02 | Disposition: A | Discharge status: Home / Self Care | Payer: Medicare HMO | Attending: Family Medicine | Admitting: Family Medicine

## 2018-02-02 ENCOUNTER — Encounter: Payer: Self-pay | Admitting: Emergency Medicine

## 2018-02-02 ENCOUNTER — Other Ambulatory Visit: Payer: Self-pay

## 2018-02-02 DIAGNOSIS — N390 Urinary tract infection, site not specified: Secondary | ICD-10-CM | POA: Insufficient documentation

## 2018-02-02 DIAGNOSIS — R319 Hematuria, unspecified: Secondary | ICD-10-CM | POA: Insufficient documentation

## 2018-02-02 LAB — URINALYSIS, COMPLETE (UACMP) WITH MICROSCOPIC
Bilirubin Urine: NEGATIVE
Glucose, UA: NEGATIVE mg/dL
Ketones, ur: NEGATIVE mg/dL
Nitrite: POSITIVE — AB
PROTEIN: 100 mg/dL — AB
Specific Gravity, Urine: 1.025 (ref 1.005–1.030)
pH: 5 (ref 5.0–8.0)

## 2018-02-02 MED ORDER — CIPROFLOXACIN HCL 250 MG PO TABS: 500.0000 mg | ORAL_TABLET | Freq: Two times a day (BID) | ORAL | 0 refills | Status: DC

## 2018-02-02 NOTE — ED Provider Notes (Signed)
[facility]    CSN: [id number] Arrival date & time: 02/02/18  1104     History   Chief Complaint Chief Complaint  Patient presents with  . Dysuria    HPI Laura Hatfield is a 77 y.o. female.   The history is provided by the patient.  Dysuria  Pain quality:  Burning Pain severity:  Moderate Onset quality:  Sudden Duration:  1 day Timing:  Constant Progression:  Unchanged Chronicity:  New Recent urinary tract infections: no   Relieved by:  None tried Ineffective treatments:  None tried Associated symptoms: no abdominal pain, no fever, no flank pain, no nausea, no vaginal discharge and no vomiting   Risk factors: renal disease     Past Medical History:  Diagnosis Date  . CAD (coronary artery disease)   . Diabetes mellitus without complication (HCC)   . Hyperlipidemia   . Thyroid nodule     Patient Active Problem List   Diagnosis Date Noted  . Hypertensive urgency 07/26/2017  . Aphasia 05/01/2017  . HLD (hyperlipidemia) 05/01/2017  . Diabetes (HCC) 05/01/2017  . CAD (coronary artery disease) 05/01/2017  . CKD (chronic kidney disease), stage III (HCC) 05/01/2017  . TIA (transient ischemic attack) 03/21/2016    Past Surgical History:  Procedure Laterality Date  . ANKLE SURGERY    . CHOLECYSTECTOMY    . EYE SURGERY    . HUMERUS SURGERY      OB History   None      Home Medications    Prior to Admission medications   Medication Sig Start Date End Date Taking? Authorizing Provider  acetaminophen (TYLENOL) 500 MG tablet Take by mouth.   Yes [provider]  aspirin 325 MG tablet Take 325 mg by mouth at bedtime.   Yes [provider]  cholecalciferol (VITAMIN D) 1000 units tablet Take 1,000 Units by mouth at bedtime.    Yes [provider]  clopidogrel (PLAVIX) 75 MG tablet Take 75 mg by mouth at bedtime.    Yes [provider]  Dulaglutide (TRULICITY) 0.75 MG/0.5ML SOPN Inject 0.75 mg into the skin  every Sunday.    Yes [provider]  furosemide (LASIX) 40 MG tablet Take 60 mg by mouth daily as needed for fluid.  01/04/15  Yes [provider]  gabapentin (NEURONTIN) 100 MG capsule Take 100 mg by mouth at bedtime as needed (for pain).    Yes [provider]  insulin NPH-regular Human (NOVOLIN 70/30) (70-30) 100 UNIT/ML injection Inject 25-30 Units into the skin See admin instructions. 30 units every morning and 25 units every evening   Yes [provider]  metoprolol succinate (TOPROL-XL) 100 MG 24 hr tablet Take 150 mg by mouth at bedtime.    Yes [provider]  nitroGLYCERIN (NITROSTAT) 0.4 MG SL tablet Place 1 tablet under the tongue every 5 (five) minutes as needed for chest pain. 03/30/14  Yes [provider]  rosuvastatin (CRESTOR) 40 MG tablet Take 20 mg by mouth at bedtime.    Yes [provider]  vitamin B-12 (CYANOCOBALAMIN) 1000 MCG tablet Take 2,000 mcg by mouth at bedtime.   Yes [provider]  ciprofloxacin (CIPRO) 250 MG tablet Take 2 tablets (500 mg total) by mouth every 12 (twelve) hours. 02/02/18   Payton Mccallum, MD  losartan (COZAAR) 25 MG tablet Take 25 mg by mouth at bedtime.    [provider]    Family History Family History  Problem Relation Age  of Onset  . Hypertension Mother   . Diabetes Mother   . Hypertension Father   . Stroke Father     Social History Social History   Tobacco Use  . Smoking status: Never Smoker  . Smokeless tobacco: Never Used  Substance Use Topics  . Alcohol use: No  . Drug use: No     Allergies   Atorvastatin; Lipitor [atorvastatin calcium]; Lisinopril; and Penicillins   Review of Systems Review of Systems  Constitutional: Negative for fever.  Gastrointestinal: Negative for abdominal pain, nausea and vomiting.  Genitourinary: Positive for dysuria. Negative for flank pain and vaginal discharge.     Physical Exam Triage Vital Signs ED  Triage Vitals  Enc Vitals Group     BP 02/02/18 1112 (!) 143/79     Pulse Rate 02/02/18 1112 82     Resp 02/02/18 1112 16     Temp 02/02/18 1112 98.1 F (36.7 C)     Temp Source 02/02/18 1112 Oral     SpO2 02/02/18 1112 100 %     Weight 02/02/18 1113 232 lb (105.2 kg)     Height 02/02/18 1113 5\' 8"  (1.727 m)     Head Circumference --      Peak Flow --      Pain Score 02/02/18 1111 3     Pain Loc --      Pain Edu? --      Excl. in GC? --    No data found.  Updated Vital Signs BP (!) 143/79 (BP Location: Left Arm)   Pulse 82   Temp 98.1 F (36.7 C) (Oral)   Resp 16   Ht 5\' 8"  (1.727 m)   Wt 105.2 kg   SpO2 100%   BMI 35.28 kg/m   Visual Acuity Right Eye Distance:   Left Eye Distance:   Bilateral Distance:    Right Eye Near:   Left Eye Near:    Bilateral Near:     Physical Exam  Constitutional: She appears well-developed and well-nourished. No distress.  Abdominal: Soft. Bowel sounds are normal. She exhibits no distension and no mass. There is no tenderness. There is no rebound and no guarding.  Skin: She is not diaphoretic.  Nursing note and vitals reviewed.    UC Treatments / Results  Labs (all labs ordered are listed, but only abnormal results are displayed) Labs Reviewed  URINALYSIS, COMPLETE (UACMP) WITH MICROSCOPIC - Abnormal; Notable for the following components:      Result Value   APPearance CLOUDY (*)    Hgb urine dipstick MODERATE (*)    Protein, ur 100 (*)    Nitrite POSITIVE (*)    Leukocytes, UA LARGE (*)    Bacteria, UA MANY (*)    All other components within normal limits  URINE CULTURE    EKG None  Radiology No results found.  Procedures Procedures (including critical care time)  Medications Ordered in UC Medications - No data to display  Initial Impression / Assessment and Plan / UC Course  I have reviewed the triage vital signs and the nursing notes.  Pertinent labs & imaging results that were available during my care of  the patient were reviewed by me and considered in my medical decision making (see chart for details).      Final Clinical Impressions(s) / UC Diagnoses   Final diagnoses:  Urinary tract infection with hematuria, site unspecified    ED Prescriptions    Medication Sig Dispense Auth. Provider  ciprofloxacin (CIPRO) 250 MG tablet Take 2 tablets (500 mg total) by mouth every 12 (twelve) hours. 10 tablet Payton Mccallum, MD      1. Lab result and diagnosis reviewed with patient 2. rx as per orders above; reviewed possible side effects, interactions, risks and benefits  3. Recommend supportive treatment with increased water intake 4. Follow-up prn if symptoms worsen or don't improve   Controlled Substance Prescriptions [place] Controlled Substance Registry consulted? Not Applicable   Payton Mccallum, MD 02/02/18 1341

## 2018-02-02 NOTE — ED Triage Notes (Signed)
Patient in today c/o dysuria since yesterday.

## 2018-02-04 ENCOUNTER — Telehealth (HOSPITAL_COMMUNITY): Payer: Self-pay | Admitting: Emergency Medicine

## 2018-02-04 LAB — URINE CULTURE
Culture: >=100000 — AB
Special Requests: NORMAL

## 2018-02-04 NOTE — Telephone Encounter (Signed)
Urine culture was positive for E coli and was given ciprofloxacin (CIPRO  at urgent care visit. Attempted to reach patient. No answer at this time.

## 2018-02-25 ENCOUNTER — Ambulatory Visit
Admission: EM | Admit: 2018-02-25 | Discharge: 2018-02-25 | Disposition: A | Discharge status: Home / Self Care | Payer: Medicare HMO | Attending: Family Medicine | Admitting: Family Medicine

## 2018-02-25 ENCOUNTER — Encounter: Payer: Self-pay | Admitting: Gynecology

## 2018-02-25 ENCOUNTER — Other Ambulatory Visit: Payer: Self-pay

## 2018-02-25 DIAGNOSIS — L309 Dermatitis, unspecified: Secondary | ICD-10-CM | POA: Diagnosis not present

## 2018-02-25 DIAGNOSIS — Z77120 Contact with and (suspected) exposure to mold (toxic): Secondary | ICD-10-CM | POA: Insufficient documentation

## 2018-02-25 MED ORDER — HYDROCORTISONE 2.5 % EX LOTN: TOPICAL_LOTION | Freq: Two times a day (BID) | CUTANEOUS | 0 refills | Status: DC

## 2018-02-25 NOTE — ED Provider Notes (Signed)
[facility]    CSN: [id number] Arrival date & time: 02/25/18  1330     History   Chief Complaint No chief complaint on file.   HPI Young Brim is a 78 y.o. female.   HPI  [age]-year-old female presents today stating that she is recently been exposed to mold in her home.  She has replaced her heating unit and they found mold in her crawl space.  She states that she has been noticing a distinct difference smell with the new heating unit and they have reassured her it was not from the unit.  She has had her ducts cleaned mold removed ample sent for analysis and is planning on having her crawl space demolded.  Addition she is been complaining of burning in her chest on occasion with occasional coughing and burning in her throat which she attributes to mold.  Burning on her face around  peri oral.  Around her cheeks and over her forehead.  She does have scaly lesions        Past Medical History:  Diagnosis Date  . CAD (coronary artery disease)   . Diabetes mellitus without complication (HCC)   . Hyperlipidemia   . Thyroid nodule     Patient Active Problem List   Diagnosis Date Noted  . Hypertensive urgency 07/26/2017  . Aphasia 05/01/2017  . HLD (hyperlipidemia) 05/01/2017  . Diabetes (HCC) 05/01/2017  . CAD (coronary artery disease) 05/01/2017  . CKD (chronic kidney disease), stage III (HCC) 05/01/2017  . TIA (transient ischemic attack) 03/21/2016    Past Surgical History:  Procedure Laterality Date  . ANKLE SURGERY    . CHOLECYSTECTOMY    . EYE SURGERY    . HUMERUS SURGERY      OB History   No obstetric history on file.      Home Medications    Prior to Admission medications   Medication Sig Start Date End Date Taking? Authorizing Provider  acetaminophen (TYLENOL) 500 MG tablet Take by mouth.   Yes [provider]  aspirin 325 MG tablet Take 325 mg by mouth at bedtime.   Yes [provider]  cholecalciferol (VITAMIN D)  1000 units tablet Take 1,000 Units by mouth at bedtime.    Yes [provider]  clopidogrel (PLAVIX) 75 MG tablet Take 75 mg by mouth at bedtime.    Yes [provider]  Dulaglutide (TRULICITY) 0.75 MG/0.5ML SOPN Inject 0.75 mg into the skin every Sunday.    Yes [provider]  furosemide (LASIX) 40 MG tablet Take 60 mg by mouth daily as needed for fluid.  01/04/15  Yes [provider]  gabapentin (NEURONTIN) 100 MG capsule Take 100 mg by mouth at bedtime as needed (for pain).    Yes [provider]  insulin NPH-regular Human (NOVOLIN 70/30) (70-30) 100 UNIT/ML injection Inject 25-30 Units into the skin See admin instructions. 30 units every morning and 25 units every evening   Yes [provider]  metoprolol succinate (TOPROL-XL) 100 MG 24 hr tablet Take 150 mg by mouth at bedtime.    Yes [provider]  nitroGLYCERIN (NITROSTAT) 0.4 MG SL tablet Place 1 tablet under the tongue every 5 (five) minutes as needed for chest pain. 03/30/14  Yes [provider]  rosuvastatin (CRESTOR) 40 MG tablet Take 20 mg by mouth at bedtime.    Yes [provider]  vitamin B-12 (CYANOCOBALAMIN) 1000 MCG tablet Take 2,000 mcg by mouth at bedtime.  Yes [provider]  hydrocortisone 2.5 % lotion Apply topically 2 (two) times daily. 02/25/18   Lutricia Feiloemer, William P, PA-C    Family History Family History  Problem Relation Age of Onset  . Hypertension Mother   . Diabetes Mother   . Hypertension Father   . Stroke Father     Social History Social History   Tobacco Use  . Smoking status: Never Smoker  . Smokeless tobacco: Never Used  Substance Use Topics  . Alcohol use: No  . Drug use: No     Allergies   Atorvastatin; Lipitor [atorvastatin calcium]; Lisinopril; and Penicillins   Review of Systems Review of Systems   Physical Exam Triage Vital Signs ED Triage Vitals  Enc Vitals Group     BP 02/25/18 1437 136/66       Pulse Rate 02/25/18 1437 72     Resp 02/25/18 1437 16     Temp 02/25/18 1437 98.3 F (36.8 C)     Temp Source 02/25/18 1437 Oral     SpO2 02/25/18 1437 100 %     Weight --      Height --      Head Circumference --      Peak Flow --      Pain Score 02/25/18 1434 0     Pain Loc --      Pain Edu? --      Excl. in GC? --    No data found.  Updated Vital Signs BP 136/66 (BP Location: Left Arm)   Pulse 72   Temp 98.3 F (36.8 C) (Oral)   Resp 16   SpO2 100%   Visual Acuity Right Eye Distance:   Left Eye Distance:   Bilateral Distance:    Right Eye Near:   Left Eye Near:    Bilateral Near:     Physical Exam   UC Treatments / Results  Labs (all labs ordered are listed, but only abnormal results are displayed) Labs Reviewed - No data to display  EKG None  Radiology No results found.  Procedures Procedures (including critical care time)  Medications Ordered in UC Medications - No data to display  Initial Impression / Assessment and Plan / UC Course  I have reviewed the triage vital signs and the nursing notes.  Pertinent labs & imaging results that were available during my care of the patient were reviewed by me and considered in my medical decision making (see chart for details).   I told her the rash on her face appears eczematous.  Provide her with the hydrocortisone 2-1/2% to not use more than 1 week.  In the future I have recommended that she apply a very good emollient after her bath or shower.  Her mold concerns will refer her to her primary care physician.  She is having samples of the mold analyzed for their use.  Treat the mold issue at this time.  I did encourage her to use a coolmist vaporizer at nighttime while sleeping.   Final Clinical Impressions(s) / UC Diagnoses   Final diagnoses:  Facial eczema  Suspected exposure to mold     Discharge Instructions     Follow-up with your primary care physician regarding mold exposure.   ED  Prescriptions    Medication Sig Dispense Auth. Provider   hydrocortisone 2.5 % lotion Apply topically 2 (two) times daily. 59 mL Lutricia Feiloemer, William P, PA-C     Controlled Substance Prescriptions [place] Controlled Substance Registry consulted? Not Applicable  Lutricia FeilRoemer, William P, PA-C 02/25/18 2205

## 2018-02-25 NOTE — ED Triage Notes (Signed)
Per patient was expose to mold at home. Per patient with cough  And rash on face.x 3 weeks

## 2018-02-25 NOTE — Discharge Instructions (Signed)
Follow-up with your primary care physician regarding mold exposure.

## 2018-03-10 ENCOUNTER — Other Ambulatory Visit: Payer: Self-pay

## 2018-03-10 ENCOUNTER — Emergency Department: Payer: Medicare HMO

## 2018-03-10 ENCOUNTER — Emergency Department
Admission: EM | Admit: 2018-03-10 | Discharge: 2018-03-10 | Disposition: A | Discharge status: Home / Self Care | Payer: Medicare HMO | Attending: Student in an Organized Health Care Education/Training Program | Admitting: Student in an Organized Health Care Education/Training Program

## 2018-03-10 DIAGNOSIS — R51 Headache: Secondary | ICD-10-CM | POA: Diagnosis not present

## 2018-03-10 DIAGNOSIS — E1122 Type 2 diabetes mellitus with diabetic chronic kidney disease: Secondary | ICD-10-CM | POA: Insufficient documentation

## 2018-03-10 DIAGNOSIS — Z79899 Other long term (current) drug therapy: Secondary | ICD-10-CM | POA: Diagnosis not present

## 2018-03-10 DIAGNOSIS — R0602 Shortness of breath: Secondary | ICD-10-CM | POA: Insufficient documentation

## 2018-03-10 DIAGNOSIS — Z7902 Long term (current) use of antithrombotics/antiplatelets: Secondary | ICD-10-CM | POA: Diagnosis not present

## 2018-03-10 DIAGNOSIS — Z794 Long term (current) use of insulin: Secondary | ICD-10-CM | POA: Insufficient documentation

## 2018-03-10 DIAGNOSIS — I2510 Atherosclerotic heart disease of native coronary artery without angina pectoris: Secondary | ICD-10-CM | POA: Diagnosis not present

## 2018-03-10 DIAGNOSIS — N183 Chronic kidney disease, stage 3 (moderate): Secondary | ICD-10-CM | POA: Diagnosis not present

## 2018-03-10 DIAGNOSIS — Z8673 Personal history of transient ischemic attack (TIA), and cerebral infarction without residual deficits: Secondary | ICD-10-CM | POA: Insufficient documentation

## 2018-03-10 DIAGNOSIS — R208 Other disturbances of skin sensation: Secondary | ICD-10-CM | POA: Insufficient documentation

## 2018-03-10 DIAGNOSIS — R05 Cough: Secondary | ICD-10-CM | POA: Insufficient documentation

## 2018-03-10 DIAGNOSIS — R059 Cough, unspecified: Secondary | ICD-10-CM

## 2018-03-10 LAB — COMPREHENSIVE METABOLIC PANEL
ALBUMIN: 3.9 g/dL (ref 3.5–5.0)
ALT: 16 U/L (ref 0–44)
AST: 22 U/L (ref 15–41)
Alkaline Phosphatase: 93 U/L (ref 38–126)
Anion gap: 9 (ref 5–15)
BUN: 18 mg/dL (ref 8–23)
CO2: 25 mmol/L (ref 22–32)
Calcium: 9.6 mg/dL (ref 8.9–10.3)
Chloride: 104 mmol/L (ref 98–111)
Creatinine, Ser: 1.31 mg/dL — ABNORMAL HIGH (ref 0.44–1.00)
GFR calc Af Amer: 45 mL/min — ABNORMAL LOW (ref >60)
GFR calc non Af Amer: 39 mL/min — ABNORMAL LOW (ref >60)
Glucose, Bld: 180 mg/dL — ABNORMAL HIGH (ref 70–99)
Potassium: 3.8 mmol/L (ref 3.5–5.1)
Sodium: 138 mmol/L (ref 135–145)
Total Bilirubin: 0.6 mg/dL (ref 0.3–1.2)
Total Protein: 7.4 g/dL (ref 6.5–8.1)

## 2018-03-10 LAB — PROTIME-INR
INR: 0.92
Prothrombin Time: 12.3 seconds (ref 11.4–15.2)

## 2018-03-10 LAB — CBC WITH DIFFERENTIAL/PLATELET
Abs Immature Granulocytes: 0.04 10*3/uL (ref 0.00–0.07)
Basophils Absolute: 0.1 10*3/uL (ref 0.0–0.1)
Basophils Relative: 1 %
Eosinophils Absolute: 0.2 10*3/uL (ref 0.0–0.5)
Eosinophils Relative: 2 %
HCT: 43.2 % (ref 36.0–46.0)
Hemoglobin: 13.9 g/dL (ref 12.0–15.0)
Immature Granulocytes: 1 %
Lymphocytes Relative: 36 %
Lymphs Abs: 3.1 10*3/uL (ref 0.7–4.0)
MCH: 26 pg (ref 26.0–34.0)
MCHC: 32.2 g/dL (ref 30.0–36.0)
MCV: 80.9 fL (ref 80.0–100.0)
MONOS PCT: 10 %
Monocytes Absolute: 0.8 10*3/uL (ref 0.1–1.0)
Neutro Abs: 4.5 10*3/uL (ref 1.7–7.7)
Neutrophils Relative %: 50 %
Platelets: 253 10*3/uL (ref 150–400)
RBC: 5.34 MIL/uL — ABNORMAL HIGH (ref 3.87–5.11)
RDW: 15 % (ref 11.5–15.5)
WBC: 8.6 10*3/uL (ref 4.0–10.5)
nRBC: 0 % (ref 0.0–0.2)

## 2018-03-10 LAB — COOXEMETRY PANEL
Carboxyhemoglobin: 0.1 % — ABNORMAL LOW (ref 0.5–1.5)
METHEMOGLOBIN: 0.6 % (ref 0.0–1.5)
O2 Saturation: 35.9 %
Total oxygen content: 38.4 mL/dL

## 2018-03-10 LAB — TROPONIN I: Troponin I: <0.03 ng/mL (ref <0.03)

## 2018-03-10 LAB — BRAIN NATRIURETIC PEPTIDE: B Natriuretic Peptide: 43 pg/mL (ref 0.0–100.0)

## 2018-03-10 MED ORDER — DOXYCYCLINE HYCLATE 100 MG PO TABS: 100.0000 mg | ORAL_TABLET | Freq: Two times a day (BID) | ORAL | 0 refills | Status: DC

## 2018-03-10 NOTE — ED Provider Notes (Signed)
-----------------------------------------   4:54 PM on 03/10/2018 -----------------------------------------  EKG viewed and interpreted by myself shows a normal sinus rhythm at 73 bpm with a narrow QRS, normal axis, normal intervals, nonspecific ST changes.  Patient does have mild T wave inversions in the lateral leads, has flattening in the lateral leads on prior EKGs, largely unchanged.   Minna Antis, MD 03/10/18 1655

## 2018-03-10 NOTE — ED Triage Notes (Signed)
Pt comes via POV from home with c/o cough, nausea and possible mold exposure.  Pt states this started last week. Pt states she had a new heating unit put in and got a awful smell when she turned it on.

## 2018-03-10 NOTE — ED Triage Notes (Signed)
FIRST NURSE NOTE-ambulatory. Unlabored. NAD

## 2018-03-10 NOTE — ED Provider Notes (Signed)
[facility] Emergency Department Provider Note  ____________________________________________  Time seen: Approximately 3:45 PM  I have reviewed the triage vital signs and the nursing notes.   HISTORY  Chief Complaint Cough; Nausea; and possible mold exposure    HPI Laura Hatfield is a 78 y.o. female who presents the emergency department complaining of cough, shortness of breath, burning around the lips.  Patient reports that she had a new heating unit placed in her home prior to the cold season.  She reports that when she initially turned it on in November when the weather became cold, she had a odor, turned the unit off and had that heating installer's return.  They tested the system with no obvious findings.  While they were looking through her duck work they found mold in her crawl space.  Patient had a cleaning company come out, scrub the word work, remove the insulation, put a new moisture barrier in the house.  Patient reports that when she turned her heating element back on approximately 2 weeks ago she smelled the odor again.  Patient is concerned at this time that she may been exposed to mold as she is having coughing, burning sensation in her chest, burning sensation around her lips.  Patient has complained of some headache and nausea as well.  She was originally seen in urgent care approximately 2 weeks ago, and they informed her they were unable to test for mold.  Patient has had no testing on the mold found in the crawl space to identify species.  Patient was referred to her primary care for referral for further testing.  Patient has not followed up with primary care but returns to the emergency department today complaining of burning in her chest, coughing, shortness of breath, nausea.  No medications for his complaint prior to arrival.  Patient endorses a headache but denies any radicular symptoms in the upper or lower extremity.  She denies any weakness  unilaterally.  No difficulty speaking or formulating words or thoughts.  No recent travel, surgeries, immobilization.  No history of DVT or PE.  No palpitations.  Patient is on Plavix for coronary artery disease.  Patient has a history of coronary artery disease, diabetes, hyperlipidemia, chronic kidney disease, TIA.  Patient denies any complaints of chronic medical problems at this time.    Past Medical History:  Diagnosis Date  . CAD (coronary artery disease)   . Diabetes mellitus without complication (HCC)   . Hyperlipidemia   . Thyroid nodule     Patient Active Problem List   Diagnosis Date Noted  . Hypertensive urgency 07/26/2017  . Aphasia 05/01/2017  . HLD (hyperlipidemia) 05/01/2017  . Diabetes (HCC) 05/01/2017  . CAD (coronary artery disease) 05/01/2017  . CKD (chronic kidney disease), stage III (HCC) 05/01/2017  . TIA (transient ischemic attack) 03/21/2016    Past Surgical History:  Procedure Laterality Date  . ANKLE SURGERY    . CHOLECYSTECTOMY    . EYE SURGERY    . HUMERUS SURGERY      Prior to Admission medications   Medication Sig Start Date End Date Taking? Authorizing Provider  acetaminophen (TYLENOL) 500 MG tablet Take by mouth.    [provider]  aspirin 325 MG tablet Take 325 mg by mouth at bedtime.    [provider]  cholecalciferol (VITAMIN D) 1000 units tablet Take 1,000 Units by mouth at bedtime.     [provider]  clopidogrel (PLAVIX) 75 MG tablet Take 75  mg by mouth at bedtime.     [provider]  doxycycline (VIBRA-TABS) 100 MG tablet Take 1 tablet (100 mg total) by mouth 2 (two) times daily. 03/10/18   Karilyn Wind, Delorise RoyalsJonathan D, PA-C  Dulaglutide (TRULICITY) 0.75 MG/0.5ML SOPN Inject 0.75 mg into the skin every Sunday.     [provider]  furosemide (LASIX) 40 MG tablet Take 60 mg by mouth daily as needed for fluid.  01/04/15   [provider]  gabapentin (NEURONTIN) 100 MG capsule Take 100 mg  by mouth at bedtime as needed (for pain).     [provider]  hydrocortisone 2.5 % lotion Apply topically 2 (two) times daily. 02/25/18   Lutricia Feiloemer, William P, PA-C  insulin NPH-regular Human (NOVOLIN 70/30) (70-30) 100 UNIT/ML injection Inject 25-30 Units into the skin See admin instructions. 30 units every morning and 25 units every evening    [provider]  metoprolol succinate (TOPROL-XL) 100 MG 24 hr tablet Take 150 mg by mouth at bedtime.     [provider]  nitroGLYCERIN (NITROSTAT) 0.4 MG SL tablet Place 1 tablet under the tongue every 5 (five) minutes as needed for chest pain. 03/30/14   [provider]  rosuvastatin (CRESTOR) 40 MG tablet Take 20 mg by mouth at bedtime.     [provider]  vitamin B-12 (CYANOCOBALAMIN) 1000 MCG tablet Take 2,000 mcg by mouth at bedtime.    [provider]    Allergies Atorvastatin; Lipitor [atorvastatin calcium]; Lisinopril; and Penicillins  Family History  Problem Relation Age of Onset  . Hypertension Mother   . Diabetes Mother   . Hypertension Father   . Stroke Father     Social History Social History   Tobacco Use  . Smoking status: Never Smoker  . Smokeless tobacco: Never Used  Substance Use Topics  . Alcohol use: No  . Drug use: No     Review of Systems  Constitutional: No fever/chills Eyes: No visual changes. No discharge ENT: No upper respiratory complaints. Cardiovascular: no substernal chest pain.   Respiratory: Positive cough. No SOB.Patient reports a burning sensation throughout her "lungs." Gastrointestinal: No abdominal pain.  No nausea, no vomiting.  No diarrhea.  No constipation. Musculoskeletal: Negative for musculoskeletal pain. Skin: Negative for rash, abrasions, lacerations, ecchymosis. Neurological: Negative for headaches, focal weakness or numbness. 10-point ROS otherwise negative.  ____________________________________________   PHYSICAL EXAM:  VITAL  SIGNS: ED Triage Vitals [03/10/18 1529]  Enc Vitals Group     BP (!) 135/54     Pulse Rate 66     Resp 18     Temp 98.4 F (36.9 C)     Temp Source Oral     SpO2 100 %     Weight 220 lb (99.8 kg)     Height 5\' 8"  (1.727 m)     Head Circumference      Peak Flow      Pain Score 5     Pain Loc      Pain Edu?      Excl. in GC?      Constitutional: Alert and oriented. Well appearing and in no acute distress. Eyes: Conjunctivae are normal. PERRL. EOMI. Head: Atraumatic. ENT:      Ears: EACs and TMs unremarkable bilaterally.      Nose: Mild clear congestion/rhinnorhea.  Turbinates are slightly edematous and erythematous.  No tenderness to percussion over the sinuses.      Mouth/Throat: Mucous membranes are moist.  No  erythema, edema in the oropharynx.  Uvula is midline. Neck: No stridor.   Hematological/Lymphatic/Immunilogical: No cervical lymphadenopathy. Cardiovascular: Normal rate, regular rhythm. Normal S1 and S2.  No murmurs, rubs, gallops.  Good peripheral circulation. Respiratory: Normal respiratory effort without tachypnea or retractions. Lungs CTAB. Good air entry to the bases with no decreased or absent breath sounds. Musculoskeletal: Full range of motion to all extremities. No gross deformities appreciated. Neurologic:  Normal speech and language. No gross focal neurologic deficits are appreciated.  Skin:  Skin is warm, dry and intact. No rash noted. Psychiatric: Mood and affect are normal. Speech and behavior are normal. Patient exhibits appropriate insight and judgement.   ____________________________________________   LABS (all labs ordered are listed, but only abnormal results are displayed)  Labs Reviewed  COMPREHENSIVE METABOLIC PANEL - Abnormal; Notable for the following components:      Result Value   Glucose, Bld 180 (*)    Creatinine, Ser 1.31 (*)    GFR calc non Af Amer 39 (*)    GFR calc Af Amer 45 (*)    All other components within normal limits   CBC WITH DIFFERENTIAL/PLATELET - Abnormal; Notable for the following components:   RBC 5.34 (*)    All other components within normal limits  COOXEMETRY PANEL - Abnormal; Notable for the following components:   Carboxyhemoglobin 0.1 (*)    All other components within normal limits  BRAIN NATRIURETIC PEPTIDE  TROPONIN I  PROTIME-INR   ____________________________________________  EKG   ____________________________________________  RADIOLOGY I personally viewed and evaluated these images as part of my medical decision making, as well as reviewing the written report by the radiologist.  I concur with radiologist finding of reticulonodular densities.  No consolidation.  Dg Chest 2 View  Result Date: 03/10/2018 CLINICAL DATA:  Cough. EXAM: CHEST - 2 VIEW COMPARISON:  Radiographs of September 10, 2011. FINDINGS: Mild cardiomegaly is noted. No pneumothorax or pleural effusion is noted. Increased reticulonodular densities are noted throughout both lungs which may represent edema or possibly inflammation. Bony thorax is unremarkable. IMPRESSION: Mildly increased reticulonodular densities are noted in both lungs concerning for possible edema or atypical inflammation. Mild cardiomegaly is noted. Electronically Signed   By: Lupita Raider, M.D.   On: 03/10/2018 16:30    ____________________________________________    PROCEDURES  Procedure(s) performed:    Procedures           Medications - No data to display   ____________________________________________   INITIAL IMPRESSION / ASSESSMENT AND PLAN / ED COURSE  Pertinent labs & imaging results that were available during my care of the patient were reviewed by me and considered in my medical decision making (see chart for details).  Review of the [place] CSRS was performed in accordance of the [facility] prior to dispensing any controlled drugs.  Clinical Course as of Mar 10 2313  Wed Mar 10, 2018  1604 Patient presents the emergency  department complaining of possible mold exposure.  Patient had mold discovered in her crawl space after replacing the heated unit of her house.  Patient had some symptoms when she initially turned on the unit 2 months ago.  Patient turn it off, had it reinspected with no findings.  Patient then turned the heating unit back on approximately 2 weeks ago and had a return of symptoms to include headache, burning around the mouth, burning in her chest, coughing, shortness of breath.  Patient was evaluated in urgent care with no acute findings.  Patient was  referred to primary care for further work-up to include testing for mono.  Patient has not followed up with her primary care.  Patient continues to have symptoms and presents emergency department.  Differential includes bronchitis, pneumonia, irritation from mold exposure, invasive mold infection of the lungs, carbon monoxide poisoning, CHF, ACS, PE.  Patient is on anticoagulation and only risk factor is age over 3750.  Otherwise, patient has no other risk factors for PE.  This time I will not order a d-dimer.  Patient will be evaluated with labs, chest x-ray, EKG.   [JC]  1649 Patient's x-ray returns with findings consistent with reticulonodular densities/pattern.  Differential includes pneumonitis, sarcoidosis, pneumocystis pneumonia, granulomas, Langerhans' cell histiocytosis, histoplasmosis, atypical infection, pulmonary edema.  DG Chest 2 View [JC]    Clinical Course User Index [JC] Harden Bramer, Delorise RoyalsJonathan D, PA-C     Patient's diagnosis is consistent with cough.  Patient presents emergency department concerned that she may have a mold infection in her lungs.  Patient states that symptoms began after installing a new heating unit and discovering mold underneath her house.  Symptoms have been intermittent in nature for several months.  No fevers or chills, nasal congestion, sore throat concerning for URI symptoms.  Given symptoms began after installation of a  heating unit, patient was tested for carboxyhemoglobin for carbon monoxide poisoning.  No indication on labs for same.  Labs are overall reassuring.  Consistent with previous labs in regards to chronic kidney dysfunction.  Given findings reticulonodular densities patient was treated for atypical pneumonia with doxycycline.  At this time, I will refer to pulmonology for further evaluation of possible mold exposure and ongoing several month history of symptoms..  No indication for further work-up at this time..  Patient is given ED precautions to return to the ED for any worsening or new symptoms.     ____________________________________________  FINAL CLINICAL IMPRESSION(S) / ED DIAGNOSES  Final diagnoses:  Cough      NEW MEDICATIONS STARTED DURING THIS VISIT:  ED Discharge Orders         Ordered    doxycycline (VIBRA-TABS) 100 MG tablet  2 times daily     03/10/18 1810              This chart was dictated using voice recognition software/Dragon. Despite best efforts to proofread, errors can occur which can change the meaning. Any change was purely unintentional.    Racheal PatchesCuthriell, Elianys Conry D, PA-C 03/10/18 2315    Willy Eddyobinson, Patrick, MD 03/10/18 [phone]

## 2018-03-29 ENCOUNTER — Ambulatory Visit: Payer: Medicare HMO | Admitting: Internal Medicine

## 2018-03-29 ENCOUNTER — Encounter: Payer: Self-pay | Admitting: Internal Medicine

## 2018-03-29 VITALS — BP 155/77 | HR 78 | Resp 16 | Ht 67.5 in | Wt 232.0 lb

## 2018-03-29 DIAGNOSIS — R0602 Shortness of breath: Secondary | ICD-10-CM | POA: Diagnosis not present

## 2018-03-29 DIAGNOSIS — R05 Cough: Secondary | ICD-10-CM | POA: Diagnosis not present

## 2018-03-29 DIAGNOSIS — I2583 Coronary atherosclerosis due to lipid rich plaque: Secondary | ICD-10-CM

## 2018-03-29 DIAGNOSIS — I2510 Atherosclerotic heart disease of native coronary artery without angina pectoris: Secondary | ICD-10-CM

## 2018-03-29 DIAGNOSIS — I5022 Chronic systolic (congestive) heart failure: Secondary | ICD-10-CM

## 2018-03-29 DIAGNOSIS — R059 Cough, unspecified: Secondary | ICD-10-CM

## 2018-03-29 NOTE — Progress Notes (Signed)
[facility] [place], [place] [address]  Pulmonary Sleep Medicine   Office Visit Note  Patient Name: Laura Hatfield DOB: 03-Dec-1940 MRN [id number]  Date of Service: 03/29/2018  Complaints/HPI: She states that in January she had been having issues with cough and congestion. This followed aftere a change in her heating and air. She was told that she may have some mold that was blowing around. Patient states that she had mucus drainage and also the cough. She states no wheeze noted at the time. Patient was seen in the ED and was noted to have increased interstitial pattern read as pulmonary edema. Patient She has had a echo back in 2019 showing EF of 45-50% with a diagnosis of CHF and CAD. States that she does take lasix for the edema. Denies smoking and no exposure to smoke. 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  ROS  General: (-) fever, (-) chills, (-) night sweats, (-) weakness Skin: (-) rashes, (-) itching,. Eyes: (-) visual changes, (-) redness, (-) itching. Nose and Sinuses: (-) nasal stuffiness or itchiness, (-) postnasal drip, (-) nosebleeds, (-) sinus trouble. Mouth and Throat: (-) sore throat, (-) hoarseness. Neck: (-) swollen glands, (-) enlarged thyroid, (-) neck pain. Respiratory: + cough, (-) bloody sputum, + shortness of breath, - wheezing. Cardiovascular: - ankle swelling, (-) chest pain. Lymphatic: (-) lymph node enlargement. Neurologic: (-) numbness, (-) tingling. Psychiatric: (-) anxiety, (-) depression   Current Medication: Outpatient Encounter Medications as of 03/29/2018  Medication Sig  . acetaminophen (TYLENOL) 500 MG tablet Take by mouth.  Marland Kitchen aspirin 325 MG tablet Take 325 mg by mouth at bedtime.  . cholecalciferol (VITAMIN D) 1000 units tablet Take 1,000 Units by mouth at bedtime.   . clopidogrel (PLAVIX) 75 MG tablet Take 75 mg by mouth at bedtime.   Marland Kitchen doxycycline (VIBRA-TABS) 100 MG tablet Take 1 tablet  (100 mg total) by mouth 2 (two) times daily.  . Dulaglutide (TRULICITY) 6.21 HY/8.6VH SOPN Inject 0.75 mg into the skin every Sunday.   . escitalopram (LEXAPRO) 5 MG tablet Take 5 mg by mouth daily.  . furosemide (LASIX) 40 MG tablet Take 60 mg by mouth daily as needed for fluid.   Marland Kitchen gabapentin (NEURONTIN) 100 MG capsule Take 100 mg by mouth at bedtime as needed (for pain).   . hydrocortisone 2.5 % lotion Apply topically 2 (two) times daily.  . insulin NPH-regular Human (NOVOLIN 70/30) (70-30) 100 UNIT/ML injection Inject 25-30 Units into the skin See admin instructions. 30 units every morning and 25 units every evening  . ipratropium (ATROVENT) 0.03 % nasal spray Place into the nose.  . metoprolol succinate (TOPROL-XL) 100 MG 24 hr tablet Take 150 mg by mouth at bedtime.   . nitroGLYCERIN (NITROSTAT) 0.4 MG SL tablet Place 1 tablet under the tongue every 5 (five) minutes as needed for chest pain.  . rosuvastatin (CRESTOR) 40 MG tablet Take 20 mg by mouth at bedtime.   . vitamin B-12 (CYANOCOBALAMIN) 1000 MCG tablet Take 2,000 mcg by mouth at bedtime.   No facility-administered encounter medications on file as of 03/29/2018.     Surgical History: Past Surgical History:  Procedure Laterality Date  . ANKLE SURGERY    . CHOLECYSTECTOMY    . EYE SURGERY    . HUMERUS SURGERY      Medical History: Past Medical History:  Diagnosis Date  . CAD (coronary artery disease)   . Diabetes mellitus without complication ([place])   .  Hyperlipidemia   . Thyroid nodule     Family History: Family History  Problem Relation Age of Onset  . Hypertension Mother   . Diabetes Mother   . Hypertension Father   . Stroke Father     Social History: Social History   Socioeconomic History  . Marital status: Divorced    Spouse name: Not on file  . Number of children: Not on file  . Years of education: Not on file  . Highest education level: Not on file  Occupational History  . Occupation: retired   Scientific laboratory technician  . Financial resource strain: Not on file  . Food insecurity:    Worry: Not on file    Inability: Not on file  . Transportation needs:    Medical: Not on file    Non-medical: Not on file  Tobacco Use  . Smoking status: Never Smoker  . Smokeless tobacco: Never Used  Substance and Sexual Activity  . Alcohol use: No  . Drug use: No  . Sexual activity: Not on file  Lifestyle  . Physical activity:    Days per week: Not on file    Minutes per session: Not on file  . Stress: Not on file  Relationships  . Social connections:    Talks on phone: Not on file    Gets together: Not on file    Attends religious service: Not on file    Active member of club or organization: Not on file    Attends meetings of clubs or organizations: Not on file    Relationship status: Not on file  . Intimate partner violence:    Fear of current or ex partner: Not on file    Emotionally abused: Not on file    Physically abused: Not on file    Forced sexual activity: Not on file  Other Topics Concern  . Not on file  Social History Narrative  . Not on file    Vital Signs: Blood pressure (!) 155/77, pulse 78, resp. rate 16, height 5' 7.5" (1.715 m), weight 232 lb (105.2 kg), SpO2 95 %.  Examination: General Appearance: The patient is well-developed, well-nourished, and in no distress. Skin: Gross inspection of skin unremarkable. Head: normocephalic, no gross deformities. Eyes: no gross deformities noted. ENT: ears appear grossly normal no exudates. Neck: Supple. No thyromegaly. No LAD. Respiratory: scattered rhonchi noted on exam. Cardiovascular: Normal S1 and S2 without murmur or rub. Extremities: No cyanosis. pulses are equal. Neurologic: Alert and oriented. No involuntary movements.  LABS: Recent Results (from the past 2160 hour(s))  Urinalysis, Complete w Microscopic     Status: Abnormal   Collection Time: 02/02/18 11:17 AM  Result Value Ref Range   Color, Urine YELLOW YELLOW    APPearance CLOUDY (A) CLEAR   Specific Gravity, Urine 1.025 1.005 - 1.030   pH 5.0 5.0 - 8.0   Glucose, UA NEGATIVE NEGATIVE mg/dL   Hgb urine dipstick MODERATE (A) NEGATIVE   Bilirubin Urine NEGATIVE NEGATIVE   Ketones, ur NEGATIVE NEGATIVE mg/dL   Protein, ur 100 (A) NEGATIVE mg/dL   Nitrite POSITIVE (A) NEGATIVE   Leukocytes, UA LARGE (A) NEGATIVE   Squamous Epithelial / LPF 0-5 0 - 5   WBC, UA >50 0 - 5 WBC/hpf   RBC / HPF 6-10 0 - 5 RBC/hpf   Bacteria, UA MANY (A) NONE SEEN    Comment: Performed at [facility], [address] [place], [place] [address]  Urine culture  Status: Abnormal   Collection Time: 02/02/18 11:17 AM  Result Value Ref Range   Specimen Description      URINE, CLEAN CATCH Performed at [facility], [address], [place], [place] [address]    Special Requests      Normal Performed at [facility], [address] [place], [place] [address]    Culture >=100,000 COLONIES/mL ESCHERICHIA COLI (A)    Report Status 02/04/2018 FINAL    Organism ID, Bacteria ESCHERICHIA COLI (A)       Susceptibility   Escherichia coli - MIC*    AMPICILLIN 8 SENSITIVE Sensitive     CEFAZOLIN <=4 SENSITIVE Sensitive     CEFTRIAXONE <=1 SENSITIVE Sensitive     CIPROFLOXACIN <=0.25 SENSITIVE Sensitive     GENTAMICIN <=1 SENSITIVE Sensitive     IMIPENEM <=0.25 SENSITIVE Sensitive     NITROFURANTOIN <=16 SENSITIVE Sensitive     TRIMETH/SULFA <=20 SENSITIVE Sensitive     AMPICILLIN/SULBACTAM 4 SENSITIVE Sensitive     PIP/TAZO <=4 SENSITIVE Sensitive     Extended ESBL NEGATIVE Sensitive     * >=100,000 COLONIES/mL ESCHERICHIA COLI  Comprehensive metabolic panel     Status: Abnormal   Collection Time: 03/10/18  4:29 PM  Result Value Ref Range   Sodium 138 135 - 145 mmol/L   Potassium 3.8 3.5 - 5.1 mmol/L   Chloride 104 98 - 111 mmol/L   CO2 25 22 - 32 mmol/L   Glucose, Bld 180 (H) 70 - 99 mg/dL   BUN 18 8 - 23 mg/dL    Creatinine, Ser 1.31 (H) 0.44 - 1.00 mg/dL   Calcium 9.6 8.9 - 10.3 mg/dL   Total Protein 7.4 6.5 - 8.1 g/dL   Albumin 3.9 3.5 - 5.0 g/dL   AST 22 15 - 41 U/L   ALT 16 0 - 44 U/L   Alkaline Phosphatase 93 38 - 126 U/L   Total Bilirubin 0.6 0.3 - 1.2 mg/dL   GFR calc non Af Amer 39 (L) >60 mL/min   GFR calc Af Amer 45 (L) >60 mL/min   Anion gap 9 5 - 15    Comment: Performed at [facility], [address] [place], [place] [address]  CBC with Differential     Status: Abnormal   Collection Time: 03/10/18  4:29 PM  Result Value Ref Range   WBC 8.6 4.0 - 10.5 K/uL   RBC 5.34 (H) 3.87 - 5.11 MIL/uL   Hemoglobin 13.9 12.0 - 15.0 g/dL   HCT 43.2 36.0 - 46.0 %   MCV 80.9 80.0 - 100.0 fL   MCH 26.0 26.0 - 34.0 pg   MCHC 32.2 30.0 - 36.0 g/dL   RDW 15.0 11.5 - 15.5 %   Platelets 253 150 - 400 K/uL   nRBC 0.0 0.0 - 0.2 %   Neutrophils Relative % 50 %   Neutro Abs 4.5 1.7 - 7.7 K/uL   Lymphocytes Relative 36 %   Lymphs Abs 3.1 0.7 - 4.0 K/uL   Monocytes Relative 10 %   Monocytes Absolute 0.8 0.1 - 1.0 K/uL   Eosinophils Relative 2 %   Eosinophils Absolute 0.2 0.0 - 0.5 K/uL   Basophils Relative 1 %   Basophils Absolute 0.1 0.0 - 0.1 K/uL   Immature Granulocytes 1 %   Abs Immature Granulocytes 0.04 0.00 - 0.07 K/uL    Comment: Performed at [facility], [address], [place],  [address]  Brain natriuretic  peptide     Status: None   Collection Time: 03/10/18  4:29 PM  Result Value Ref Range   B Natriuretic Peptide 43.0 0.0 - 100.0 pg/mL    Comment: Performed at [facility], [address] [place], [place] [address]  Troponin I - Once     Status: None   Collection Time: 03/10/18  4:29 PM  Result Value Ref Range   Troponin I <0.03 <0.03 ng/mL    Comment: Performed at [facility], [address] [place], [place] [address]  Protime-INR     Status: None   Collection Time: 03/10/18  4:29 PM  Result Value Ref Range   Prothrombin Time  12.3 11.4 - 15.2 seconds   INR 0.92     Comment: Performed at [facility], [address] [place], [place] [address]  .Cooxemetry Panel (carboxy, met, total hgb, O2 sat)     Status: Abnormal   Collection Time: 03/10/18  4:45 PM  Result Value Ref Range   O2 Saturation 35.9 %   Carboxyhemoglobin 0.1 (L) 0.5 - 1.5 %    Comment: CRITICAL RESULT CALLED TO, READ BACK BY AND VERIFIED WITH: COLE AMORIELLO RN '@[id number]'$  ON 03/10/18 BY SNM    Methemoglobin 0.6 0.0 - 1.5 %   Total oxygen content 38.4 mL/dL    Comment: Performed at [facility], [address], [place], [place] [address]    Radiology: Dg Chest 2 View  Result Date: 03/10/2018 CLINICAL DATA:  Cough. EXAM: CHEST - 2 VIEW COMPARISON:  Radiographs of September 10, 2011. FINDINGS: Mild cardiomegaly is noted. No pneumothorax or pleural effusion is noted. Increased reticulonodular densities are noted throughout both lungs which may represent edema or possibly inflammation. Bony thorax is unremarkable. IMPRESSION: Mildly increased reticulonodular densities are noted in both lungs concerning for possible edema or atypical inflammation. Mild cardiomegaly is noted. Electronically Signed   By: Marijo Conception, M.D.   On: 03/10/2018 16:30    No results found.  Dg Chest 2 View  Result Date: 03/10/2018 CLINICAL DATA:  Cough. EXAM: CHEST - 2 VIEW COMPARISON:  Radiographs of September 10, 2011. FINDINGS: Mild cardiomegaly is noted. No pneumothorax or pleural effusion is noted. Increased reticulonodular densities are noted throughout both lungs which may represent edema or possibly inflammation. Bony thorax is unremarkable. IMPRESSION: Mildly increased reticulonodular densities are noted in both lungs concerning for possible edema or atypical inflammation. Mild cardiomegaly is noted. Electronically Signed   By: Marijo Conception, M.D.   On: 03/10/2018 16:30      Assessment and Plan: Patient Active Problem List   Diagnosis Date Noted  .  Hypertensive urgency 07/26/2017  . Aphasia 05/01/2017  . HLD (hyperlipidemia) 05/01/2017  . Diabetes ([place]) 05/01/2017  . CAD (coronary artery disease) 05/01/2017  . CKD (chronic kidney disease), stage III ([place]) 05/01/2017  . TIA (transient ischemic attack) 03/21/2016    1. CoughUnclear etiology cough could be related to asthma have recommended that we get this worked up will get a pulmonary function study on her.  In addition is CT scan of the chest time resolution cuts was ordered to better evaluate the lung parenchyma for interstitial lung disease. 2. SOB as above will order pulmonary function study will continue with supportive care 3. CAD no active chest pain as noted will continue to monitor 4. CHF  Echocardiogram ordered to assess the left ventricular function and also to look at the right-sided pressures.  General Counseling: I have discussed the  findings of the evaluation and examination with Enid Derry.  I have also discussed any further diagnostic evaluation thatmay be needed or ordered today. Nyonna verbalizes understanding of the findings of todays visit. We also reviewed her medications today and discussed drug interactions and side effects including but not limited excessive drowsiness and altered mental states. We also discussed that there is always a risk not just to her but also people around her. she has been encouraged to call the office with any questions or concerns that should arise related to todays visit.  Orders Placed This Encounter  Procedures  . CT Chest High Resolution    Standing Status:   Future    Number of Occurrences:   1    Standing Expiration Date:   05/28/2019    Order Specific Question:   Preferred imaging location?    Answer:   [facility]    Order Specific Question:   Radiology Contrast Protocol - do NOT remove file path    Answer:   [email or web address]  . ECHOCARDIOGRAM COMPLETE    Standing Status:   Future    Number of  Occurrences:   1    Standing Expiration Date:   06/27/2019    Order Specific Question:   Where should this test be performed    Answer:   External    Order Specific Question:   Perflutren DEFINITY (image enhancing agent) should be administered unless hypersensitivity or allergy exist    Answer:   Administer Perflutren    Order Specific Question:   Reason for exam-Echo    Answer:   Dyspnea  786.09 / R06.00  . Pulmonary function test    Standing Status:   Future    Number of Occurrences:   1    Standing Expiration Date:   03/30/2019    Order Specific Question:   Where should this test be performed?    Answer:   [facility]    Time spent:   40 minutes  I have personally obtained a history, examined the patient, evaluated laboratory and imaging results, formulated the assessment and plan and placed orders.    Allyne Gee, MD [facility] Pulmonary and Critical Care Sleep medicine

## 2018-03-29 NOTE — Patient Instructions (Signed)
Pneumonitis  Pneumonitis is inflammation of the lungs. Infection or exposure to certain substances or allergens can cause this condition. Allergens are substances that you are allergic to. What are the causes? This condition may be caused by:  An infection from bacteria or a virus (pneumonia).  Exposure to certain substances in the workplace. This includes working on farms and in certain industries. Some substances that can cause this condition include asbestos, silica, inhaled acids, or inhaled chlorine gas.  Repeated exposure to bird feathers, bird feces, or other allergens.  Medicines such as chemotherapy drugs, certain antibiotic medicines, and some heart medicines.  Radiation therapy.  Exposure to mold. A hot tub, sauna, or home humidifier can have mold growing in it, even if it looks clean. You can breathe in the mold through water vapor.  Breathing in (aspirating) stomach contents, food, or liquids into the lungs. What are the signs or symptoms? Symptoms of this condition include:  Shortness of breath or trouble breathing. This is the most common symptom.  Cough.  Fever.  Decreased energy.  Decreased appetite. How is this diagnosed? This condition may be diagnosed based on:  Your medical history.  Physical exam.  Blood tests.  Other tests, including: ? Pulmonary function test (PFT). This measures how well your lungs work. ? Chest X-ray. ? CT scan of the lungs. ? Bronchoscopy. In this procedure, your health care provider looks at your airways through an instrument called a bronchoscope. ? Lung biopsy. In this procedure, your health care provider takes a small piece of tissue from your lungs to examine it. How is this treated? Treatment depends on the cause of the condition. If the cause is exposure to a substance, avoiding further exposure to that substance will help reduce your symptoms. Possible medical treatments for pneumonitis include:  Corticosteroid  medicine to help decrease inflammation.  Antibiotic medicine to help fight an infection caused by bacteria.  Bronchodilators or inhalers to help relax the muscles and make breathing easier.  Oxygen therapy, if you are having trouble breathing. Follow these instructions at home:  Take or use over-the-counter and prescription medicines only as told by your health care provider. This includes any inhaler use.  Avoid exposure to any substance that caused your pneumonitis. If you need to work with substances that can cause pneumonitis, wear a mask to protect your lungs.  If you were prescribed an antibiotic, take it as told by your health care provider. Do not stop taking the antibiotic even if you start to feel better.  If you were prescribed an inhaler, keep it with you at all times.  Do not use any products that contain nicotine or tobacco, such as cigarettes and e-cigarettes. If you need help quitting, ask your health care provider.  Keep all follow-up visits as told by your health care provider. This is important. Contact a health care provider if:  You have a fever.  Your symptoms get worse. Get help right away if:  You have new or worse shortness of breath.  You develop a blue color (cyanosis) under your fingernails. Summary  Pneumonitis is inflammation of the lungs. This condition can be caused by infection or exposure to certain substances or allergens.  The most common symptom of this condition is shortness of breath or trouble breathing.  Treatment depends on the cause of your condition. This information is not intended to replace advice given to you by your health care provider. Make sure you discuss any questions you have with your health   care provider. Document Released: 07/31/2009 Document Revised: 01/03/2016 Document Reviewed: 01/03/2016 Elsevier Interactive Patient Education  2019 [employer] Inc[employer]  

## 2018-03-31 ENCOUNTER — Ambulatory Visit: Payer: Medicare HMO | Admitting: Internal Medicine

## 2018-03-31 DIAGNOSIS — R0602 Shortness of breath: Secondary | ICD-10-CM | POA: Diagnosis not present

## 2018-03-31 LAB — PULMONARY FUNCTION TEST

## 2018-04-09 ENCOUNTER — Encounter (INDEPENDENT_AMBULATORY_CARE_PROVIDER_SITE_OTHER): Payer: Self-pay

## 2018-04-09 ENCOUNTER — Ambulatory Visit
Admission: RE | Admit: 2018-04-09 | Discharge: 2018-04-09 | Disposition: A | Discharge status: Home / Self Care | Payer: Medicare HMO | Source: Ambulatory Visit | Attending: Internal Medicine | Admitting: Internal Medicine

## 2018-04-09 ENCOUNTER — Ambulatory Visit: Payer: Medicare HMO

## 2018-04-09 DIAGNOSIS — R05 Cough: Secondary | ICD-10-CM | POA: Diagnosis not present

## 2018-04-09 DIAGNOSIS — R0602 Shortness of breath: Secondary | ICD-10-CM

## 2018-04-09 DIAGNOSIS — R059 Cough, unspecified: Secondary | ICD-10-CM

## 2018-04-13 NOTE — Procedures (Signed)
[facility] [address] [place], [address]  DATE OF SERVICE: March 31, 2018  Complete Pulmonary Function Testing Interpretation:  FINDINGS:  Forced vital capacity is within normal limits.  The FEV1 is 1.86 L which is 101% of predicted and is within normal limits.  FEV1 FVC ratio is within normal range.  Postbronchodilator no significant change in the FEV1 however clinical improvement may occur in the absence of spirometric improvement and does not preclude the use of bronchodilators.  Tongue capacity is normal residual volume is normal residual volume total capacity ratio is increased.  DLCO is within normal limits.  IMPRESSION:  This pulmonary function study is within normal range clinical correlation is recommended  Yevonne Pax, MD [facility] Pulmonary Critical Care Medicine Sleep Medicine

## 2018-04-15 ENCOUNTER — Ambulatory Visit: Payer: Medicare HMO | Admitting: Internal Medicine

## 2018-04-15 ENCOUNTER — Encounter: Payer: Self-pay | Admitting: Internal Medicine

## 2018-04-15 VITALS — BP 136/76 | HR 64 | Resp 16 | Ht 67.5 in | Wt 236.0 lb

## 2018-04-15 DIAGNOSIS — R059 Cough, unspecified: Secondary | ICD-10-CM

## 2018-04-15 DIAGNOSIS — I2510 Atherosclerotic heart disease of native coronary artery without angina pectoris: Secondary | ICD-10-CM | POA: Diagnosis not present

## 2018-04-15 DIAGNOSIS — I5022 Chronic systolic (congestive) heart failure: Secondary | ICD-10-CM

## 2018-04-15 DIAGNOSIS — R05 Cough: Secondary | ICD-10-CM

## 2018-04-15 NOTE — Progress Notes (Signed)
[facility] [place], [place] [address]  Pulmonary Sleep Medicine   Office Visit Note  Patient Name: Laura Hatfield DOB: Oct 22, 1940 MRN [id number]  Date of Service: 04/15/2018  Complaints/HPI: Pt is here to review PFT, Echo, and Chest CT. patient's PFT shows a forced vital capacity that is within normal limits.  Her FEV1 is 1.86 L which is 101% of the pre-predicted and within normal limits at this time.  Her FEV1/FVC ratio is within normal limits as well.  Her chest CT showed mild to moderate air trapping both lungs which was indicative of small airway disease.  She not show signs of interstitial lung disease.  Her echo showed some diastolic dysfunction with a normal ventricular ejection fraction.  She also had some mild mitral valve regurg as well as tricuspid valve regurg.  Overall patient appears to be doing well and reports that her symptoms have improved.  ROS  General: (-) fever, (-) chills, (-) night sweats, (-) weakness Skin: (-) rashes, (-) itching,. Eyes: (-) visual changes, (-) redness, (-) itching. Nose and Sinuses: (-) nasal stuffiness or itchiness, (-) postnasal drip, (-) nosebleeds, (-) sinus trouble. Mouth and Throat: (-) sore throat, (-) hoarseness. Neck: (-) swollen glands, (-) enlarged thyroid, (-) neck pain. Respiratory: - cough, (-) bloody sputum, - shortness of breath, - wheezing. Cardiovascular: - ankle swelling, (-) chest pain. Lymphatic: (-) lymph node enlargement. Neurologic: (-) numbness, (-) tingling. Psychiatric: (-) anxiety, (-) depression   Current Medication: Outpatient Encounter Medications as of 04/15/2018  Medication Sig  . acetaminophen (TYLENOL) 500 MG tablet Take by mouth.  Marland Kitchen aspirin 325 MG tablet Take 325 mg by mouth at bedtime.  . cholecalciferol (VITAMIN D) 1000 units tablet Take 1,000 Units by mouth at bedtime.   . clopidogrel (PLAVIX) 75 MG tablet Take 75 mg by mouth at bedtime.   Marland Kitchen doxycycline (VIBRA-TABS)  100 MG tablet Take 1 tablet (100 mg total) by mouth 2 (two) times daily.  . Dulaglutide (TRULICITY) 0.56 PV/9.4IA SOPN Inject 0.75 mg into the skin every Sunday.   . escitalopram (LEXAPRO) 5 MG tablet Take 5 mg by mouth daily.  . furosemide (LASIX) 40 MG tablet Take 60 mg by mouth daily as needed for fluid.   Marland Kitchen gabapentin (NEURONTIN) 100 MG capsule Take 100 mg by mouth at bedtime as needed (for pain).   . hydrocortisone 2.5 % lotion Apply topically 2 (two) times daily.  . insulin NPH-regular Human (NOVOLIN 70/30) (70-30) 100 UNIT/ML injection Inject 25-30 Units into the skin See admin instructions. 30 units every morning and 25 units every evening  . ipratropium (ATROVENT) 0.03 % nasal spray Place into the nose.  . metoprolol succinate (TOPROL-XL) 100 MG 24 hr tablet Take 150 mg by mouth at bedtime.   . nitroGLYCERIN (NITROSTAT) 0.4 MG SL tablet Place 1 tablet under the tongue every 5 (five) minutes as needed for chest pain.  . rosuvastatin (CRESTOR) 40 MG tablet Take 20 mg by mouth at bedtime.   . vitamin B-12 (CYANOCOBALAMIN) 1000 MCG tablet Take 2,000 mcg by mouth at bedtime.   No facility-administered encounter medications on file as of 04/15/2018.     Surgical History: Past Surgical History:  Procedure Laterality Date  . ANKLE SURGERY    . CHOLECYSTECTOMY    . EYE SURGERY    . HUMERUS SURGERY      Medical History: Past Medical History:  Diagnosis Date  . CAD (coronary artery disease)   . Diabetes mellitus without complication (  [place])   . Hyperlipidemia   . Thyroid nodule     Family History: Family History  Problem Relation Age of Onset  . Hypertension Mother   . Diabetes Mother   . Hypertension Father   . Stroke Father     Social History: Social History   Socioeconomic History  . Marital status: Divorced    Spouse name: Not on file  . Number of children: Not on file  . Years of education: Not on file  . Highest education level: Not on file  Occupational History   . Occupation: retired  Scientific laboratory technician  . Financial resource strain: Not on file  . Food insecurity:    Worry: Not on file    Inability: Not on file  . Transportation needs:    Medical: Not on file    Non-medical: Not on file  Tobacco Use  . Smoking status: Never Smoker  . Smokeless tobacco: Never Used  Substance and Sexual Activity  . Alcohol use: No  . Drug use: No  . Sexual activity: Not on file  Lifestyle  . Physical activity:    Days per week: Not on file    Minutes per session: Not on file  . Stress: Not on file  Relationships  . Social connections:    Talks on phone: Not on file    Gets together: Not on file    Attends religious service: Not on file    Active member of club or organization: Not on file    Attends meetings of clubs or organizations: Not on file    Relationship status: Not on file  . Intimate partner violence:    Fear of current or ex partner: Not on file    Emotionally abused: Not on file    Physically abused: Not on file    Forced sexual activity: Not on file  Other Topics Concern  . Not on file  Social History Narrative  . Not on file    Vital Signs: Blood pressure 136/76, pulse 64, resp. rate 16, height 5' 7.5" (1.715 m), weight 236 lb (107 kg), SpO2 95 %.  Examination: General Appearance: The patient is well-developed, well-nourished, and in no distress. Skin: Gross inspection of skin unremarkable. Head: normocephalic, no gross deformities. Eyes: no gross deformities noted. ENT: ears appear grossly normal no exudates. Neck: Supple. No thyromegaly. No LAD. Respiratory: clear bilateraly. Cardiovascular: Normal S1 and S2 without murmur or rub. Extremities: No cyanosis. pulses are equal. Neurologic: Alert and oriented. No involuntary movements.  LABS: Recent Results (from the past 2160 hour(s))  Urinalysis, Complete w Microscopic     Status: Abnormal   Collection Time: 02/02/18 11:17 AM  Result Value Ref Range   Color, Urine YELLOW  YELLOW   APPearance CLOUDY (A) CLEAR   Specific Gravity, Urine 1.025 1.005 - 1.030   pH 5.0 5.0 - 8.0   Glucose, UA NEGATIVE NEGATIVE mg/dL   Hgb urine dipstick MODERATE (A) NEGATIVE   Bilirubin Urine NEGATIVE NEGATIVE   Ketones, ur NEGATIVE NEGATIVE mg/dL   Protein, ur 100 (A) NEGATIVE mg/dL   Nitrite POSITIVE (A) NEGATIVE   Leukocytes, UA LARGE (A) NEGATIVE   Squamous Epithelial / LPF 0-5 0 - 5   WBC, UA >50 0 - 5 WBC/hpf   RBC / HPF 6-10 0 - 5 RBC/hpf   Bacteria, UA MANY (A) NONE SEEN    Comment: Performed at [facility], [address] [place], [place] [address]  Urine culture  Status: Abnormal   Collection Time: 02/02/18 11:17 AM  Result Value Ref Range   Specimen Description      URINE, CLEAN CATCH Performed at [facility], [address], [place], [place] [address]    Special Requests      Normal Performed at [facility], [address] [place], [place] [address]    Culture >=100,000 COLONIES/mL ESCHERICHIA COLI (A)    Report Status 02/04/2018 FINAL    Organism ID, Bacteria ESCHERICHIA COLI (A)       Susceptibility   Escherichia coli - MIC*    AMPICILLIN 8 SENSITIVE Sensitive     CEFAZOLIN <=4 SENSITIVE Sensitive     CEFTRIAXONE <=1 SENSITIVE Sensitive     CIPROFLOXACIN <=0.25 SENSITIVE Sensitive     GENTAMICIN <=1 SENSITIVE Sensitive     IMIPENEM <=0.25 SENSITIVE Sensitive     NITROFURANTOIN <=16 SENSITIVE Sensitive     TRIMETH/SULFA <=20 SENSITIVE Sensitive     AMPICILLIN/SULBACTAM 4 SENSITIVE Sensitive     PIP/TAZO <=4 SENSITIVE Sensitive     Extended ESBL NEGATIVE Sensitive     * >=100,000 COLONIES/mL ESCHERICHIA COLI  Comprehensive metabolic panel     Status: Abnormal   Collection Time: 03/10/18  4:29 PM  Result Value Ref Range   Sodium 138 135 - 145 mmol/L   Potassium 3.8 3.5 - 5.1 mmol/L   Chloride 104 98 - 111 mmol/L   CO2 25 22 - 32 mmol/L   Glucose, Bld 180 (H) 70 - 99 mg/dL   BUN 18 8 - 23 mg/dL    Creatinine, Ser 1.31 (H) 0.44 - 1.00 mg/dL   Calcium 9.6 8.9 - 10.3 mg/dL   Total Protein 7.4 6.5 - 8.1 g/dL   Albumin 3.9 3.5 - 5.0 g/dL   AST 22 15 - 41 U/L   ALT 16 0 - 44 U/L   Alkaline Phosphatase 93 38 - 126 U/L   Total Bilirubin 0.6 0.3 - 1.2 mg/dL   GFR calc non Af Amer 39 (L) >60 mL/min   GFR calc Af Amer 45 (L) >60 mL/min   Anion gap 9 5 - 15    Comment: Performed at [facility], [address] [place], [place] [address]  CBC with Differential     Status: Abnormal   Collection Time: 03/10/18  4:29 PM  Result Value Ref Range   WBC 8.6 4.0 - 10.5 K/uL   RBC 5.34 (H) 3.87 - 5.11 MIL/uL   Hemoglobin 13.9 12.0 - 15.0 g/dL   HCT 43.2 36.0 - 46.0 %   MCV 80.9 80.0 - 100.0 fL   MCH 26.0 26.0 - 34.0 pg   MCHC 32.2 30.0 - 36.0 g/dL   RDW 15.0 11.5 - 15.5 %   Platelets 253 150 - 400 K/uL   nRBC 0.0 0.0 - 0.2 %   Neutrophils Relative % 50 %   Neutro Abs 4.5 1.7 - 7.7 K/uL   Lymphocytes Relative 36 %   Lymphs Abs 3.1 0.7 - 4.0 K/uL   Monocytes Relative 10 %   Monocytes Absolute 0.8 0.1 - 1.0 K/uL   Eosinophils Relative 2 %   Eosinophils Absolute 0.2 0.0 - 0.5 K/uL   Basophils Relative 1 %   Basophils Absolute 0.1 0.0 - 0.1 K/uL   Immature Granulocytes 1 %   Abs Immature Granulocytes 0.04 0.00 - 0.07 K/uL    Comment: Performed at [facility], [address], [place], [place] [address]  Brain natriuretic  peptide     Status: None   Collection Time: 03/10/18  4:29 PM  Result Value Ref Range   B Natriuretic Peptide 43.0 0.0 - 100.0 pg/mL    Comment: Performed at [facility], [address] [place], [place] [address]  Troponin I - Once     Status: None   Collection Time: 03/10/18  4:29 PM  Result Value Ref Range   Troponin I <0.03 <0.03 ng/mL    Comment: Performed at [facility], [address] [place], [place] [address]  Protime-INR     Status: None   Collection Time: 03/10/18  4:29 PM  Result Value Ref Range   Prothrombin Time  12.3 11.4 - 15.2 seconds   INR 0.92     Comment: Performed at [facility], [address], [place], [place] [address]  .Cooxemetry Panel (carboxy, met, total hgb, O2 sat)     Status: Abnormal   Collection Time: 03/10/18  4:45 PM  Result Value Ref Range   O2 Saturation 35.9 %   Carboxyhemoglobin 0.1 (L) 0.5 - 1.5 %    Comment: CRITICAL RESULT CALLED TO, READ BACK BY AND VERIFIED WITH: COLE AMORIELLO RN _0  ON 03/10/18 BY SNM    Methemoglobin 0.6 0.0 - 1.5 %   Total oxygen content 38.4 mL/dL    Comment: Performed at [facility], [address], [place], [place] [address]    Radiology: Ct Chest High Resolution  Result Date: 04/09/2018 CLINICAL DATA:  Cough and dyspnea.  Nonsmoker. EXAM: CT CHEST WITHOUT CONTRAST TECHNIQUE: Multidetector CT imaging of the chest was performed following the standard protocol without intravenous contrast. High resolution imaging of the lungs, as well as inspiratory and expiratory imaging, was performed. COMPARISON:  03/10/2026 chest radiograph. FINDINGS: Cardiovascular: Normal heart size. No significant pericardial effusion/thickening. Three-vessel coronary atherosclerosis. Mildly atherosclerotic nonaneurysmal thoracic aorta. Top-normal main pulmonary artery (3.1 cm diameter). Mediastinum/Nodes: Hypodense 3.9 cm left thyroid nodule, with associated mild right tracheal deviation at the thoracic inlet. Unremarkable esophagus. No pathologically enlarged axillary, mediastinal or hilar lymph nodes, noting limited sensitivity for the detection of hilar adenopathy on this noncontrast study. Lungs/Pleura: No pneumothorax. No pleural effusion. There is bandlike consolidation in the apical right lung (involving the right upper lobe and superior segment right lower lobe) with associated prominent volume loss, distortion and moderate bronchiectasis. These findings are unchanged compared to 09/10/2011 chest radiograph. Otherwise no acute consolidative airspace  disease. No lung masses. A few tiny solid pulmonary nodules in the left lung, largest 3 mm in the peripheral left lower lobe (series 3/image 97), which are stable since 08/26/2012 CT abdomen study and considered benign. There is mild-to-moderate patchy air trapping in both lungs on the expiration sequence. No significant regions of subpleural reticulation, ground-glass attenuation or frank honeycombing. Upper abdomen: No acute abnormality. Musculoskeletal: No aggressive appearing focal osseous lesions. Mild thoracic spondylosis. IMPRESSION: 1. Mild-to-moderate air trapping in both lungs, indicative of small airways disease. 2. No findings to suggest interstitial lung disease. 3. Bandlike consolidation with associated volume loss, distortion and bronchiectasis in the apical right lung, unchanged on radiographs since 2013, compatible with nonspecific postinfectious/postinflammatory scarring. 4. Left thyroid lobe 3.9 cm hypodense nodule. Thyroid ultrasound correlation is indicated if not previously performed. This follows ACR consensus guidelines: Managing Incidental Thyroid Nodules Detected on Imaging: White Paper of the ACR Incidental Thyroid Findings Committee. J Am Coll Radiol 2015; 12:143-150. 5. Three-vessel coronary atherosclerosis. Aortic Atherosclerosis (ICD10-I70.0). Electronically Signed   By: Janina Mayo.D.  On: 04/09/2018 11:02    No results found.  Ct Chest High Resolution  Result Date: 04/09/2018 CLINICAL DATA:  Cough and dyspnea.  Nonsmoker. EXAM: CT CHEST WITHOUT CONTRAST TECHNIQUE: Multidetector CT imaging of the chest was performed following the standard protocol without intravenous contrast. High resolution imaging of the lungs, as well as inspiratory and expiratory imaging, was performed. COMPARISON:  03/10/2026 chest radiograph. FINDINGS: Cardiovascular: Normal heart size. No significant pericardial effusion/thickening. Three-vessel coronary atherosclerosis. Mildly atherosclerotic  nonaneurysmal thoracic aorta. Top-normal main pulmonary artery (3.1 cm diameter). Mediastinum/Nodes: Hypodense 3.9 cm left thyroid nodule, with associated mild right tracheal deviation at the thoracic inlet. Unremarkable esophagus. No pathologically enlarged axillary, mediastinal or hilar lymph nodes, noting limited sensitivity for the detection of hilar adenopathy on this noncontrast study. Lungs/Pleura: No pneumothorax. No pleural effusion. There is bandlike consolidation in the apical right lung (involving the right upper lobe and superior segment right lower lobe) with associated prominent volume loss, distortion and moderate bronchiectasis. These findings are unchanged compared to 09/10/2011 chest radiograph. Otherwise no acute consolidative airspace disease. No lung masses. A few tiny solid pulmonary nodules in the left lung, largest 3 mm in the peripheral left lower lobe (series 3/image 97), which are stable since 08/26/2012 CT abdomen study and considered benign. There is mild-to-moderate patchy air trapping in both lungs on the expiration sequence. No significant regions of subpleural reticulation, ground-glass attenuation or frank honeycombing. Upper abdomen: No acute abnormality. Musculoskeletal: No aggressive appearing focal osseous lesions. Mild thoracic spondylosis. IMPRESSION: 1. Mild-to-moderate air trapping in both lungs, indicative of small airways disease. 2. No findings to suggest interstitial lung disease. 3. Bandlike consolidation with associated volume loss, distortion and bronchiectasis in the apical right lung, unchanged on radiographs since 2013, compatible with nonspecific postinfectious/postinflammatory scarring. 4. Left thyroid lobe 3.9 cm hypodense nodule. Thyroid ultrasound correlation is indicated if not previously performed. This follows ACR consensus guidelines: Managing Incidental Thyroid Nodules Detected on Imaging: White Paper of the ACR Incidental Thyroid Findings Committee. J  Am Coll Radiol 2015; 12:143-150. 5. Three-vessel coronary atherosclerosis. Aortic Atherosclerosis (ICD10-I70.0). Electronically Signed   By: Ilona Sorrel M.D.   On: 04/09/2018 11:02      Assessment and Plan: Patient Active Problem List   Diagnosis Date Noted  . Hypertensive urgency 07/26/2017  . Aphasia 05/01/2017  . HLD (hyperlipidemia) 05/01/2017  . Diabetes ([place]) 05/01/2017  . CAD (coronary artery disease) 05/01/2017  . CKD (chronic kidney disease), stage III ([place]) 05/01/2017  . TIA (transient ischemic attack) 03/21/2016    1. Chronic systolic congestive heart failure (HCC) Patient's CHF appears stable at this time.  She will continue to follow-up with cardiology as scheduled.  2. Cough Cough has improved since patient had work done on the crawlspace of her house.  She had the company come back out and placed a DT medication system as well as a throughout her crawl space and she reports that her cough seems to resolve.  3. Coronary artery disease involving native heart without angina pectoris, unspecified vessel or lesion type We will continue to see cardiology as before.  General Counseling: I have discussed the findings of the evaluation and examination with Enid Derry.  I have also discussed any further diagnostic evaluation thatmay be needed or ordered today. Isha verbalizes understanding of the findings of todays visit. We also reviewed her medications today and discussed drug interactions and side effects including but not limited excessive drowsiness and altered mental states. We also discussed that there is always a risk  not just to her but also people around her. she has been encouraged to call the office with any questions or concerns that should arise related to todays visit.    Time spent: 25 This patient was seen by Orson Gear AGNP-C in Collaboration with Dr. Devona Konig as a part of collaborative care agreement.   I have personally obtained a history, examined  the patient, evaluated laboratory and imaging results, formulated the assessment and plan and placed orders.    Allyne Gee, MD [facility] Pulmonary and Critical Care Sleep medicine

## 2018-04-15 NOTE — Patient Instructions (Signed)

## 2018-04-27 ENCOUNTER — Encounter: Payer: Self-pay | Admitting: Internal Medicine

## 2018-11-03 ENCOUNTER — Other Ambulatory Visit: Payer: Self-pay

## 2018-11-03 ENCOUNTER — Ambulatory Visit
Admission: EM | Admit: 2018-11-03 | Discharge: 2018-11-03 | Disposition: A | Discharge status: Home / Self Care | Payer: Medicare HMO | Attending: Family Medicine | Admitting: Family Medicine

## 2018-11-03 ENCOUNTER — Encounter: Payer: Self-pay | Admitting: Emergency Medicine

## 2018-11-03 DIAGNOSIS — N390 Urinary tract infection, site not specified: Secondary | ICD-10-CM

## 2018-11-03 DIAGNOSIS — R319 Hematuria, unspecified: Secondary | ICD-10-CM

## 2018-11-03 DIAGNOSIS — R197 Diarrhea, unspecified: Secondary | ICD-10-CM

## 2018-11-03 LAB — URINALYSIS, COMPLETE (UACMP) WITH MICROSCOPIC
Glucose, UA: 100 mg/dL — AB
Nitrite: POSITIVE — AB
Protein, ur: 100 mg/dL — AB
Specific Gravity, Urine: >1.03 — ABNORMAL HIGH (ref 1.005–1.030)
WBC, UA: >50 WBC/hpf (ref 0–5)
pH: 5 (ref 5.0–8.0)

## 2018-11-03 LAB — BASIC METABOLIC PANEL
Anion gap: 9 (ref 5–15)
BUN: 18 mg/dL (ref 8–23)
CO2: 22 mmol/L (ref 22–32)
Calcium: 9 mg/dL (ref 8.9–10.3)
Chloride: 106 mmol/L (ref 98–111)
Creatinine, Ser: 1.18 mg/dL — ABNORMAL HIGH (ref 0.44–1.00)
GFR calc Af Amer: 50 mL/min — ABNORMAL LOW (ref >60)
GFR calc non Af Amer: 44 mL/min — ABNORMAL LOW (ref >60)
Glucose, Bld: 218 mg/dL — ABNORMAL HIGH (ref 70–99)
Potassium: 4.5 mmol/L (ref 3.5–5.1)
Sodium: 137 mmol/L (ref 135–145)

## 2018-11-03 MED ORDER — CIPROFLOXACIN HCL 250 MG PO TABS: 250.0000 mg | ORAL_TABLET | Freq: Two times a day (BID) | ORAL | 0 refills | Status: AC

## 2018-11-03 NOTE — Discharge Instructions (Addendum)
Take medication as prescribed. Rest. Drink plenty of fluids. BRAT diet. Return stool sample.   Follow up with your primary care physician this week. Return to Urgent care for new or worsening concerns.

## 2018-11-03 NOTE — ED Provider Notes (Signed)
[facility] ____________________________________________  Time seen: Approximately 2:57 PM  I have reviewed the triage vital signs and the nursing notes.   HISTORY  Chief Complaint Dysuria and Diarrhea   HPI Laura Hatfield is a 78 y.o. female presenting for evaluation of diarrhea present for just over 1 week.  States diarrhea comes and goes.  States she normally has more formed stool first day in the morning but then will have 1 or 2 episodes of diarrhea during the day.  Denies diarrhea color changes.  Denies any black or bloody stool.  States in the last few days she has been having urinary frequency and some burning with urination that feels consistent with previous UTIs.  Denies accompanying abdominal pain, nausea, vomiting, known food trigger.  Denies recent medication changes.  Denies any pain at this time.  No recent cough, congestion, sore throat or fevers.  Continues to eat and drink well.  Denies aggravating alleviating factors.  Was otherwise doing well.  No recent antibiotic use.  Olin HauserPeyser, Bruce: PCP [facility]    Past Medical History:  Diagnosis Date  . CAD (coronary artery disease)   . Diabetes mellitus without complication (HCC)   . Hyperlipidemia   . Thyroid nodule     Patient Active Problem List   Diagnosis Date Noted  . Hypertensive urgency 07/26/2017  . Aphasia 05/01/2017  . HLD (hyperlipidemia) 05/01/2017  . Diabetes (HCC) 05/01/2017  . CAD (coronary artery disease) 05/01/2017  . CKD (chronic kidney disease), stage III (HCC) 05/01/2017  . TIA (transient ischemic attack) 03/21/2016    Past Surgical History:  Procedure Laterality Date  . ANKLE SURGERY    . CHOLECYSTECTOMY    . EYE SURGERY    . HUMERUS SURGERY       No current facility-administered medications for this encounter.   Current Outpatient Medications:  .  acetaminophen (TYLENOL) 500 MG tablet, Take by mouth., Disp: , Rfl:  .  aspirin 325 MG tablet, Take 325 mg by mouth at  bedtime., Disp: , Rfl:  .  cholecalciferol (VITAMIN D) 1000 units tablet, Take 1,000 Units by mouth at bedtime. , Disp: , Rfl:  .  clopidogrel (PLAVIX) 75 MG tablet, Take 75 mg by mouth at bedtime. , Disp: , Rfl:  .  Dulaglutide (TRULICITY) 0.75 MG/0.5ML SOPN, Inject 0.75 mg into the skin every Sunday. , Disp: , Rfl:  .  furosemide (LASIX) 40 MG tablet, Take 60 mg by mouth daily as needed for fluid. , Disp: , Rfl:  .  gabapentin (NEURONTIN) 100 MG capsule, Take 100 mg by mouth at bedtime as needed (for pain). , Disp: , Rfl:  .  hydrocortisone 2.5 % lotion, Apply topically 2 (two) times daily., Disp: 59 mL, Rfl: 0 .  insulin NPH-regular Human (NOVOLIN 70/30) (70-30) 100 UNIT/ML injection, Inject 25-30 Units into the skin See admin instructions. 30 units every morning and 25 units every evening, Disp: , Rfl:  .  metoprolol succinate (TOPROL-XL) 100 MG 24 hr tablet, Take 150 mg by mouth at bedtime. , Disp: , Rfl:  .  nitroGLYCERIN (NITROSTAT) 0.4 MG SL tablet, Place 1 tablet under the tongue every 5 (five) minutes as needed for chest pain., Disp: , Rfl:  .  vitamin B-12 (CYANOCOBALAMIN) 1000 MCG tablet, Take 2,000 mcg by mouth at bedtime., Disp: , Rfl:  .  ciprofloxacin (CIPRO) 250 MG tablet, Take 1 tablet (250 mg total) by mouth 2 (two) times daily for 7 days., Disp: 14 tablet, Rfl: 0 .  doxycycline (VIBRA-TABS) 100 MG tablet, Take 1 tablet (100 mg total) by mouth 2 (two) times daily., Disp: 14 tablet, Rfl: 0 .  escitalopram (LEXAPRO) 5 MG tablet, Take 5 mg by mouth daily., Disp: , Rfl: 2 .  ipratropium (ATROVENT) 0.03 % nasal spray, Place into the nose., Disp: , Rfl:  .  REPATHA SURECLICK 140 MG/ML SOAJ, , Disp: , Rfl:  .  rosuvastatin (CRESTOR) 40 MG tablet, Take 20 mg by mouth at bedtime. , Disp: , Rfl:   Allergies Atorvastatin, Crestor [rosuvastatin], Lipitor [atorvastatin calcium], Lisinopril, Losartan, and Penicillins  Family History  Problem Relation Age of Onset  . Hypertension Mother    . Diabetes Mother   . Hypertension Father   . Stroke Father     Social History Social History   Tobacco Use  . Smoking status: Never Smoker  . Smokeless tobacco: Never Used  Substance Use Topics  . Alcohol use: No  . Drug use: No    Review of Systems Constitutional: No fever ENT: No sore throat. Cardiovascular: Denies chest pain. Respiratory: Denies shortness of breath. Gastrointestinal: No abdominal pain.  No nausea, no vomiting.  Positive diarrhea.  No constipation. Genitourinary: Positive for dysuria. Musculoskeletal: Negative for back pain. Skin: Negative for rash.   ____________________________________________   PHYSICAL EXAM:  VITAL SIGNS: ED Triage Vitals  Enc Vitals Group     BP 11/03/18 1417 (!) 152/77     Pulse Rate 11/03/18 1417 78     Resp 11/03/18 1417 16     Temp 11/03/18 1417 98.2 F (36.8 C)     Temp Source 11/03/18 1417 Oral     SpO2 11/03/18 1417 100 %     Weight 11/03/18 1412 215 lb (97.5 kg)     Height 11/03/18 1412 5' 7.5" (1.715 m)     Head Circumference --      Peak Flow --      Pain Score 11/03/18 1412 0     Pain Loc --      Pain Edu? --      Excl. in [place]? --     Constitutional: Alert and oriented. Well appearing and in no acute distress. Eyes: Conjunctivae are normal.  ENT      Head: Normocephalic and atraumatic. Cardiovascular: Normal rate, regular rhythm. Grossly normal heart sounds.  Good peripheral circulation. Respiratory: Normal respiratory effort without tachypnea nor retractions. Breath sounds are clear and equal bilaterally. No wheezes, rales, rhonchi. Gastrointestinal: Soft and nontender. Normal Bowel sounds. No CVA tenderness. Musculoskeletal: Steady gait.  Neurologic:  Normal speech and language. Speech is normal. No gait instability.  Skin:  Skin is warm, dry and intact. No rash noted. Psychiatric: Mood and affect are normal. Speech and behavior are normal. Patient exhibits appropriate insight and judgment    ___________________________________________   LABS (all labs ordered are listed, but only abnormal results are displayed)  Labs Reviewed  URINALYSIS, COMPLETE (UACMP) WITH MICROSCOPIC - Abnormal; Notable for the following components:      Result Value   APPearance CLOUDY (*)    Specific Gravity, Urine >1.030 (*)    Glucose, UA 100 (*)    Hgb urine dipstick TRACE (*)    Bilirubin Urine SMALL (*)    Ketones, ur TRACE (*)    Protein, ur 100 (*)    Nitrite POSITIVE (*)    Leukocytes,Ua SMALL (*)    Bacteria, UA FEW (*)    All other components within normal limits  BASIC METABOLIC PANEL - Abnormal; Notable  for the following components:   Glucose, Bld 218 (*)    Creatinine, Ser 1.18 (*)    GFR calc non Af Amer 44 (*)    GFR calc Af Amer 50 (*)    All other components within normal limits  URINE CULTURE  GASTROINTESTINAL PANEL BY PCR, STOOL (REPLACES STOOL CULTURE)  C DIFFICILE QUICK SCREEN W PCR REFLEX   ____________________________________________   PROCEDURES Procedures    INITIAL IMPRESSION / ASSESSMENT AND PLAN / ED COURSE  Pertinent labs & imaging results that were available during my care of the patient were reviewed by me and considered in my medical decision making (see chart for details).  Well-appearing patient.  No acute distress.  Presenting for evaluation of diarrhea and dysuria.  Urinalysis reviewed, UTI, will culture.  Patient has chronic kidney disease.  Will treat with Cipro 250 twice daily.  BMP reviewed, similar to chronic.Marland Kitchen  GI panel C. Difficile patient was and able to provide the samples in office, testing kits at home with patient to return by tomorrow.  Brat diet.  Rest, fluids and monitor.Discussed indication, risks and benefits of medications with patient.  Discussed follow up with Primary care physician this week. Discussed follow up and return parameters including no resolution or any worsening concerns. Patient verbalized understanding and agreed  to plan.   ____________________________________________   FINAL CLINICAL IMPRESSION(S) / ED DIAGNOSES  Final diagnoses:  Urinary tract infection with hematuria, site unspecified  Diarrhea, unspecified type     ED Discharge Orders         Ordered    ciprofloxacin (CIPRO) 250 MG tablet  2 times daily     11/03/18 1525           Note: This dictation was prepared with Dragon dictation along with smaller phrase technology. Any transcriptional errors that result from this process are unintentional.         Marylene Land, NP 11/03/18 1529

## 2018-11-03 NOTE — ED Triage Notes (Signed)
Pt c/o dysuria, urinary frequency, and diarrhea. Started about a week and a half ago. She states the diarrhea started before the urinary symptoms started.

## 2018-11-04 ENCOUNTER — Other Ambulatory Visit
Admission: RE | Admit: 2018-11-04 | Discharge: 2018-11-04 | Disposition: A | Discharge status: Home / Self Care | Payer: Medicare HMO | Source: Ambulatory Visit | Attending: Emergency Medicine | Admitting: Emergency Medicine

## 2018-11-04 DIAGNOSIS — N390 Urinary tract infection, site not specified: Secondary | ICD-10-CM | POA: Insufficient documentation

## 2018-11-04 DIAGNOSIS — R197 Diarrhea, unspecified: Secondary | ICD-10-CM | POA: Diagnosis not present

## 2018-11-04 DIAGNOSIS — R319 Hematuria, unspecified: Secondary | ICD-10-CM | POA: Diagnosis not present

## 2018-11-04 DIAGNOSIS — N189 Chronic kidney disease, unspecified: Secondary | ICD-10-CM | POA: Diagnosis not present

## 2018-11-04 LAB — GASTROINTESTINAL PANEL BY PCR, STOOL (REPLACES STOOL CULTURE)

## 2018-11-04 LAB — C DIFFICILE QUICK SCREEN W PCR REFLEX
C Diff antigen: NEGATIVE
C Diff toxin: NEGATIVE

## 2018-11-04 LAB — C DIFFICILE QUICK SCREEN W PCR REFLEX??: C Diff interpretation: NOT DETECTED

## 2018-11-06 LAB — URINE CULTURE: Culture: >=100000 — AB

## 2018-11-08 ENCOUNTER — Telehealth (HOSPITAL_COMMUNITY): Payer: Self-pay | Admitting: Emergency Medicine

## 2018-11-08 NOTE — Telephone Encounter (Signed)
Urine culture was positive for KLEBSIELLA PNEUMONIAE and was given cipro  at urgent care visit. Pt contacted and made aware, educated on completing antibiotic and to follow up if symptoms are persistent. Verbalized understanding.

## 2019-01-24 ENCOUNTER — Ambulatory Visit
Admission: EM | Admit: 2019-01-24 | Discharge: 2019-01-24 | Disposition: A | Discharge status: Home / Self Care | Payer: Medicare HMO | Attending: Family Medicine | Admitting: Family Medicine

## 2019-01-24 ENCOUNTER — Other Ambulatory Visit: Payer: Self-pay

## 2019-01-24 DIAGNOSIS — N390 Urinary tract infection, site not specified: Secondary | ICD-10-CM

## 2019-01-24 DIAGNOSIS — R197 Diarrhea, unspecified: Secondary | ICD-10-CM | POA: Diagnosis not present

## 2019-01-24 LAB — URINALYSIS, COMPLETE (UACMP) WITH MICROSCOPIC
Bilirubin Urine: NEGATIVE
Glucose, UA: 500 mg/dL — AB
Ketones, ur: NEGATIVE mg/dL
Nitrite: POSITIVE — AB
Protein, ur: 100 mg/dL — AB
Specific Gravity, Urine: >1.03 — ABNORMAL HIGH (ref 1.005–1.030)
WBC, UA: >50 WBC/hpf (ref 0–5)
pH: 5 (ref 5.0–8.0)

## 2019-01-24 MED ORDER — CIPROFLOXACIN HCL 500 MG PO TABS: 500.0000 mg | ORAL_TABLET | Freq: Two times a day (BID) | ORAL | 0 refills | Status: DC

## 2019-01-24 NOTE — ED Provider Notes (Signed)
[facility]    CSN: [id number] Arrival date & time: 01/24/19  1346      History   Chief Complaint Chief Complaint  Patient presents with  . Dysuria    HPI Laura Hatfield is a 78 y.o. female.   HPI  78 year old female presents with urinary frequency and dysuria.  She was treated for UTI a few weeks ago.  Few of those records reveals that she was treated for for Klebsiella pneumonia on Cipro 500 mg twice daily.  She states that that had improved but is now returned.  Is also been having diarrhea.  She states that typically when she rises in the morning to have a normal bowel movement as the day progresses her stools will become more runny.  He is allergic to most of the statins but was recently placed on Repatha and her diarrhea seemed to start after she had been taking that for a while.  She denies any abdominal pain.  She has had no blood in the stool or urine.  Denies fever or chills and has had no nausea or vomiting.  Denies any vaginal bleeding or discharge        Past Medical History:  Diagnosis Date  . CAD (coronary artery disease)   . Diabetes mellitus without complication (HCC)   . Hyperlipidemia   . Thyroid nodule     Patient Active Problem List   Diagnosis Date Noted  . Hypertensive urgency 07/26/2017  . Aphasia 05/01/2017  . HLD (hyperlipidemia) 05/01/2017  . Diabetes (HCC) 05/01/2017  . CAD (coronary artery disease) 05/01/2017  . CKD (chronic kidney disease), stage III 05/01/2017  . TIA (transient ischemic attack) 03/21/2016    Past Surgical History:  Procedure Laterality Date  . ANKLE SURGERY    . CHOLECYSTECTOMY    . EYE SURGERY    . HUMERUS SURGERY      OB History   No obstetric history on file.      Home Medications    Prior to Admission medications   Medication Sig Start Date End Date Taking? Authorizing Provider  acetaminophen (TYLENOL) 500 MG tablet Take by mouth.    [provider]  aspirin 325 MG tablet  Take 325 mg by mouth at bedtime.    [provider]  cholecalciferol (VITAMIN D) 1000 units tablet Take 1,000 Units by mouth at bedtime.     [provider]  ciprofloxacin (CIPRO) 500 MG tablet Take 1 tablet (500 mg total) by mouth 2 (two) times daily. 01/24/19   Lutricia Feil, PA-C  clopidogrel (PLAVIX) 75 MG tablet Take 75 mg by mouth at bedtime.     [provider]  Dulaglutide (TRULICITY) 0.75 MG/0.5ML SOPN Inject 0.75 mg into the skin every Sunday.     [provider]  escitalopram (LEXAPRO) 5 MG tablet Take 5 mg by mouth daily. 12/15/17   [provider]  furosemide (LASIX) 40 MG tablet Take 60 mg by mouth daily as needed for fluid.  01/04/15   [provider]  gabapentin (NEURONTIN) 100 MG capsule Take 100 mg by mouth at bedtime as needed (for pain).     [provider]  hydrocortisone 2.5 % lotion Apply topically 2 (two) times daily. 02/25/18   Lutricia Feil, PA-C  insulin NPH-regular Human (NOVOLIN 70/30) (70-30) 100 UNIT/ML injection Inject 25-30 Units into the skin See admin instructions. 30 units every morning and 25 units every evening    [provider]  ipratropium (ATROVENT)  0.03 % nasal spray Place into the nose. 01/14/18 01/14/19  [provider]  metoprolol succinate (TOPROL-XL) 100 MG 24 hr tablet Take 150 mg by mouth at bedtime.     [provider]  nitroGLYCERIN (NITROSTAT) 0.4 MG SL tablet Place 1 tablet under the tongue every 5 (five) minutes as needed for chest pain. 03/30/14   [provider]  REPATHA SURECLICK 016 MG/ML SOAJ  10/25/18   [provider]  vitamin B-12 (CYANOCOBALAMIN) 1000 MCG tablet Take 2,000 mcg by mouth at bedtime.    [provider]  rosuvastatin (CRESTOR) 40 MG tablet Take 20 mg by mouth at bedtime.   01/24/19  [provider]    Family History Family History  Problem Relation Age of Onset  . Hypertension Mother   .  Diabetes Mother   . Hypertension Father   . Stroke Father     Social History Social History   Tobacco Use  . Smoking status: Never Smoker  . Smokeless tobacco: Never Used  Substance Use Topics  . Alcohol use: No  . Drug use: No     Allergies   Atorvastatin, Crestor [rosuvastatin], Lipitor [atorvastatin calcium], Lisinopril, Losartan, and Penicillins   Review of Systems Review of Systems  Constitutional: Positive for activity change. Negative for appetite change, chills, fatigue and fever.  Gastrointestinal: Positive for diarrhea. Negative for abdominal distention, abdominal pain, blood in stool, nausea, rectal pain and vomiting.  Genitourinary: Positive for dysuria, frequency and urgency. Negative for vaginal discharge and vaginal pain.  All other systems reviewed and are negative.    Physical Exam Triage Vital Signs ED Triage Vitals [01/24/19 1525]  Enc Vitals Group     BP (!) 184/85     Pulse Rate 83     Resp 18     Temp 97.7 F (36.5 C)     Temp Source Oral     SpO2 100 %     Weight 220 lb (99.8 kg)     Height 5' 7.5" (1.715 m)     Head Circumference      Peak Flow      Pain Score 2     Pain Loc      Pain Edu?      Excl. in [place]?    No data found.  Updated Vital Signs BP (!) 184/85 (BP Location: Right Arm)   Pulse 83   Temp 97.7 F (36.5 C) (Oral)   Resp 18   Ht 5' 7.5" (1.715 m)   Wt 220 lb (99.8 kg)   SpO2 100%   BMI 33.95 kg/m   Visual Acuity Right Eye Distance:   Left Eye Distance:   Bilateral Distance:    Right Eye Near:   Left Eye Near:    Bilateral Near:     Physical Exam Vitals signs and nursing note reviewed.  Constitutional:      General: She is not in acute distress.    Appearance: Normal appearance. She is normal weight. She is not ill-appearing, toxic-appearing or diaphoretic.  HENT:     Head: Normocephalic and atraumatic.  Eyes:     Conjunctiva/sclera: Conjunctivae normal.  Neck:     Musculoskeletal: Normal range of  motion and neck supple.  Cardiovascular:     Rate and Rhythm: Normal rate and regular rhythm.  Pulmonary:     Effort: Pulmonary effort is normal.     Breath sounds: Normal breath sounds.  Abdominal:     General: Abdomen is flat.  Palpations: Abdomen is soft.     Tenderness: There is no right CVA tenderness or left CVA tenderness.  Musculoskeletal: Normal range of motion.  Skin:    General: Skin is warm and dry.  Neurological:     General: No focal deficit present.     Mental Status: She is alert and oriented to person, place, and time.  Psychiatric:        Mood and Affect: Mood normal.        Behavior: Behavior normal.        Thought Content: Thought content normal.        Judgment: Judgment normal.      UC Treatments / Results  Labs (all labs ordered are listed, but only abnormal results are displayed) Labs Reviewed  URINALYSIS, COMPLETE (UACMP) WITH MICROSCOPIC - Abnormal; Notable for the following components:      Result Value   APPearance CLOUDY (*)    Specific Gravity, Urine >1.030 (*)    Glucose, UA 500 (*)    Hgb urine dipstick SMALL (*)    Protein, ur 100 (*)    Nitrite POSITIVE (*)    Leukocytes,Ua SMALL (*)    Bacteria, UA MANY (*)    All other components within normal limits  URINE CULTURE    EKG   Radiology No results found.  Procedures Procedures (including critical care time)  Medications Ordered in UC Medications - No data to display  Initial Impression / Assessment and Plan / UC Course  I have reviewed the triage vital signs and the nursing notes.  Pertinent labs & imaging results that were available during my care of the patient were reviewed by me and considered in my medical decision making (see chart for details).   78 year old female presents with recurrent urinary tract infection.  She is also complaining of diarrhea that is atypical.  We will culture her urine and treat her with Cipro again.  Has an allergy to the cillins which  caused facial swelling so I am not going to treat this with a  cephalosporin.  Macrodantin was not effective for Klebsiella before.  Denies any problems with taking the Cipro in the past.  I have told her to make an appointment with her primary care physician regarding her diarrhea which may be a side effect of the new medications.  This will need to have further work-up.  She should drink more fluids.  She has any problems she should return to our clinic or see her primary care physician.   Final Clinical Impressions(s) / UC Diagnoses   Final diagnoses:  Lower urinary tract infectious disease  Diarrhea, unspecified type     Discharge Instructions     Drink plenty of fluids.  For the diarrhea I recommend you follow-up with your primary care physician for more work-up.    ED Prescriptions    Medication Sig Dispense Auth. Provider   ciprofloxacin (CIPRO) 500 MG tablet Take 1 tablet (500 mg total) by mouth 2 (two) times daily. 10 tablet Lutricia Feiloemer, Vennie Salsbury P, PA-C     PDMP not reviewed this encounter.   Lutricia FeilRoemer, Kissa Campoy P, PA-C 01/24/19 1659

## 2019-01-24 NOTE — ED Triage Notes (Signed)
Pt states she is having urinary frequency and dysuria. Treated for UTI a few weeks ago and was told to "come back if not resolved."

## 2019-01-24 NOTE — Discharge Instructions (Addendum)
Drink plenty of fluids.  For the diarrhea I recommend you follow-up with your primary care physician for more work-up.

## 2019-01-25 LAB — URINE CULTURE

## 2019-07-11 IMAGING — CT CT HEAD CODE STROKE
3 series · 15 of 45 positions shown, 18 images · non-contrast
Comparison: MRI of the head May 02, 2017 and CT HEAD May 01, 2017

CLINICAL DATA: Code stroke. Recurrent RIGHT hand weakness and RIGHT
facial droop beginning at [DATE] today. History of pituitary
infundibular mass, diabetes and hyperlipidemia. Suspect stroke.

EXAM:
CT HEAD WITHOUT CONTRAST
TECHNIQUE: Contiguous axial images were obtained from the base of the skull
through the vertex without intravenous contrast.

[Series 2: head wo · axial · 0.40mm/px · z∈[-72,+43]mm · 9 of 28 slices shown, 12 images]
[im 3/28  brain]
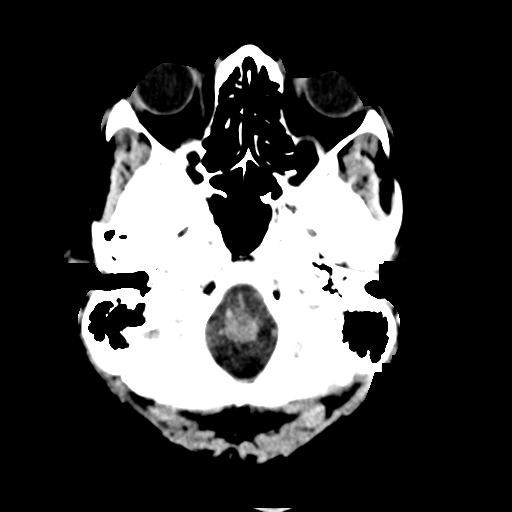
[im 3/28  bone]
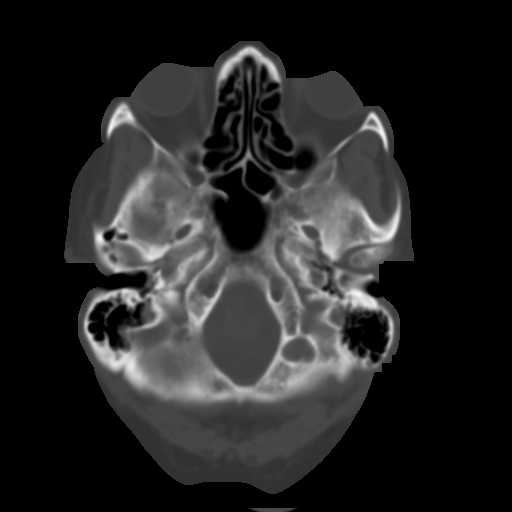
[im 6/28  brain]
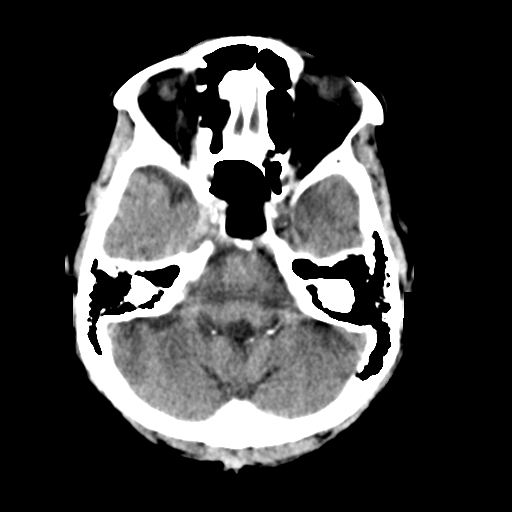
[im 9/28  brain]
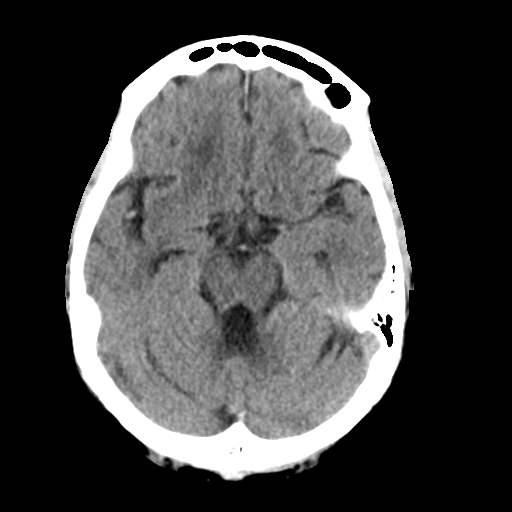
[im 12/28  brain]
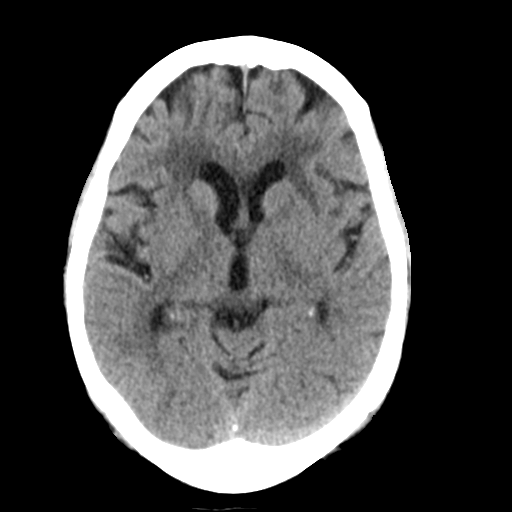
[im 15/28  brain]
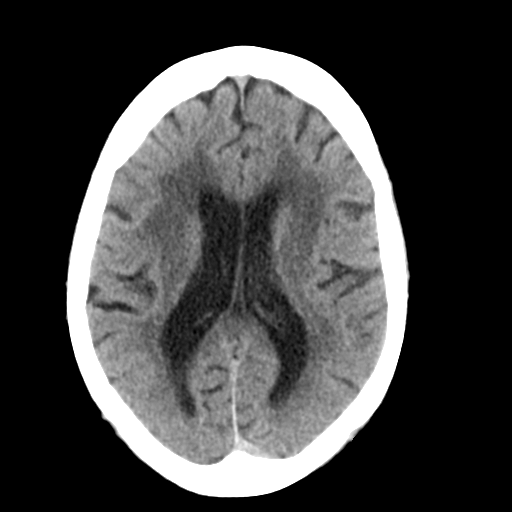
[im 15/28  bone]
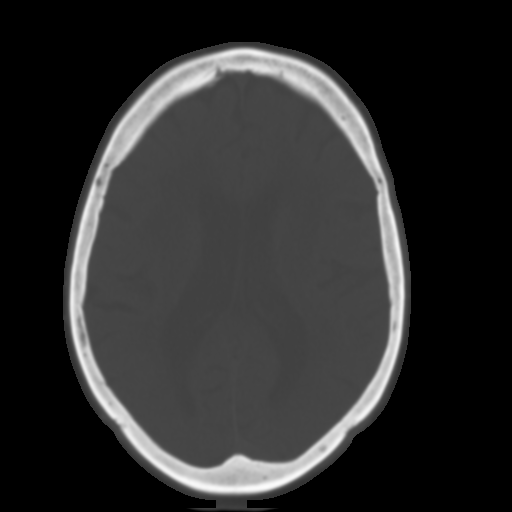
[im 17/28  brain]
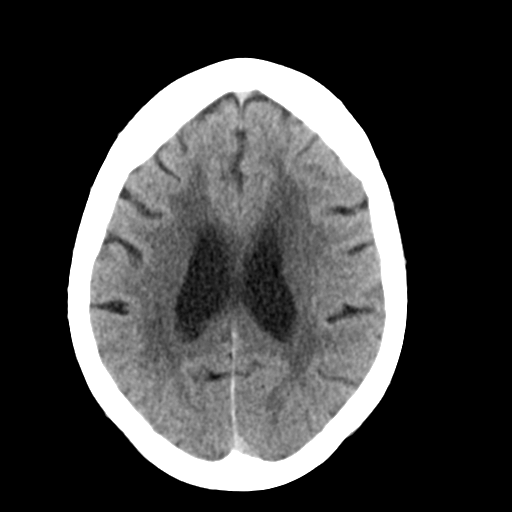
[im 20/28  brain]
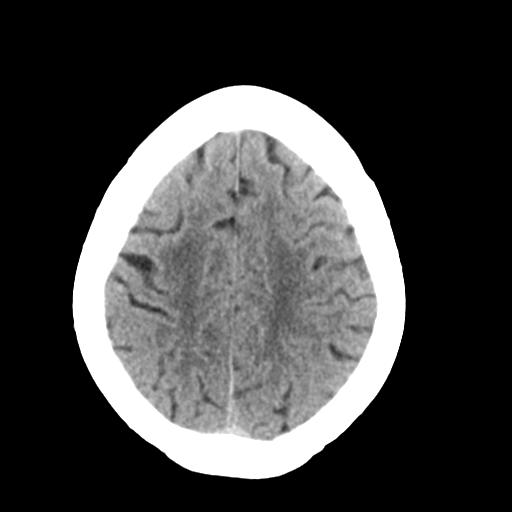
[im 23/28  brain]
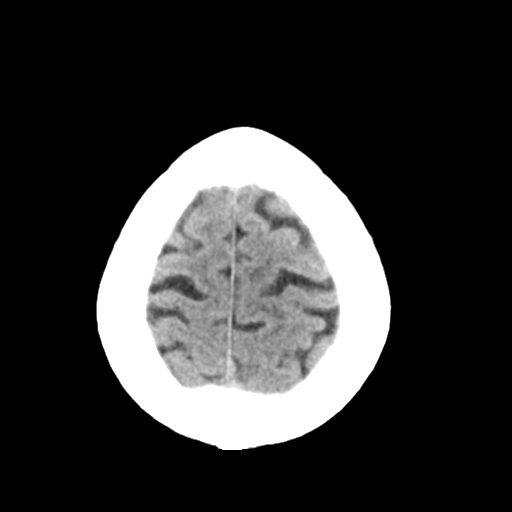
[im 26/28  brain]
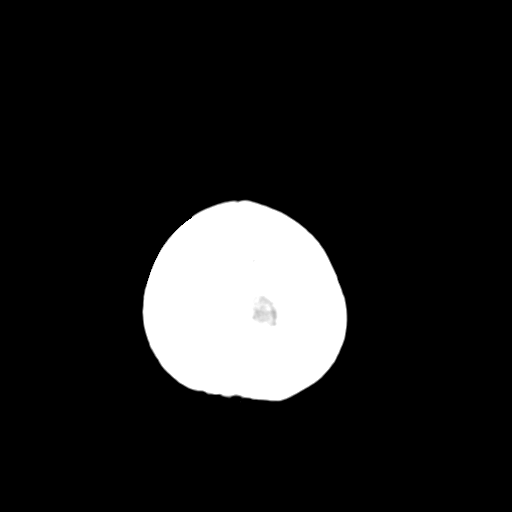
[im 26/28  bone]
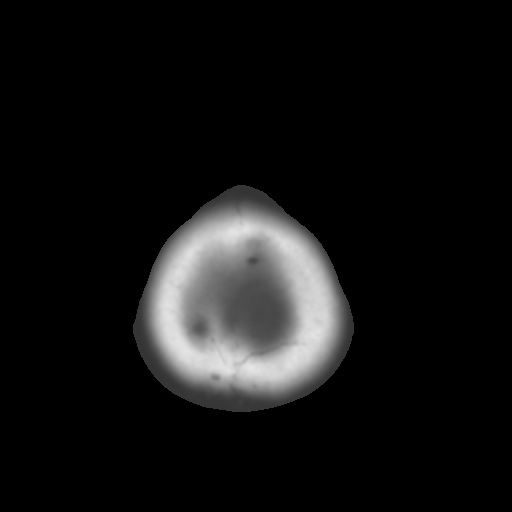

[Series 4: coronal soft tissue · coronal · 0.28mm/px · 3 of 62 slices shown]
[im 21/62  brain]
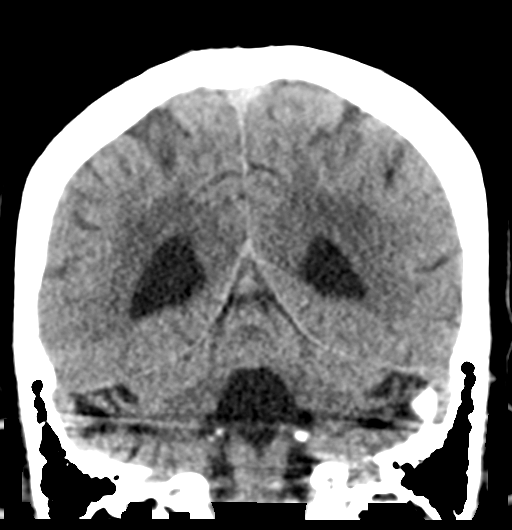
[im 28/62  brain]
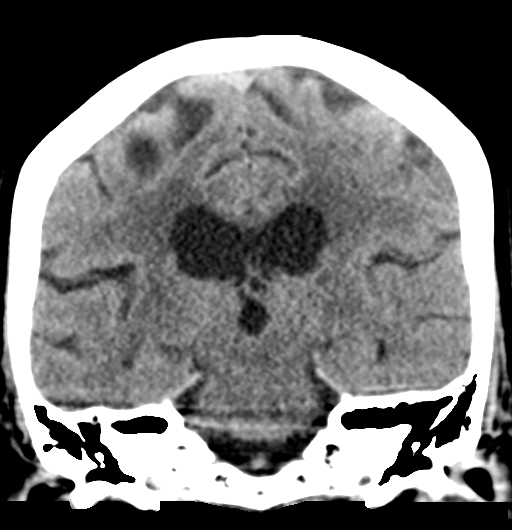
[im 34/62  brain]
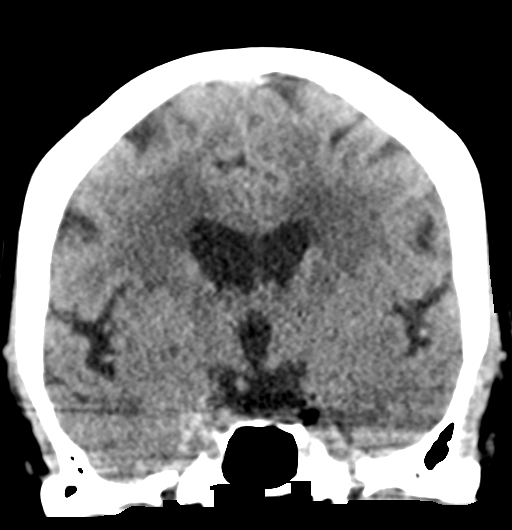

[Series 5: sagittal soft tissue · sagittal · 0.29mm/px · 3 of 48 slices shown]
[im 16/48  brain]
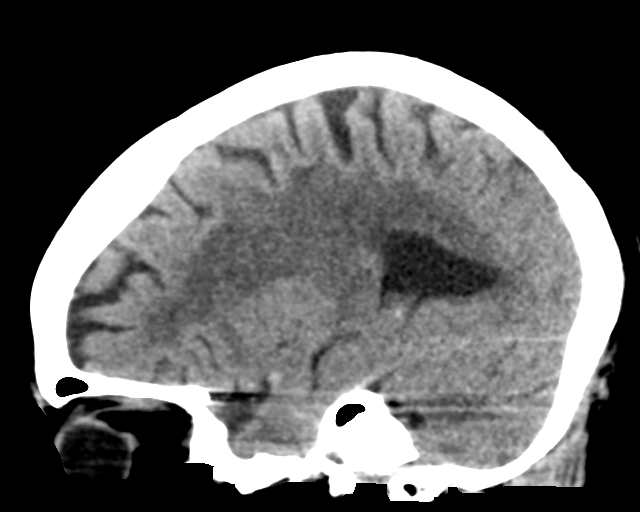
[im 24/48  brain]
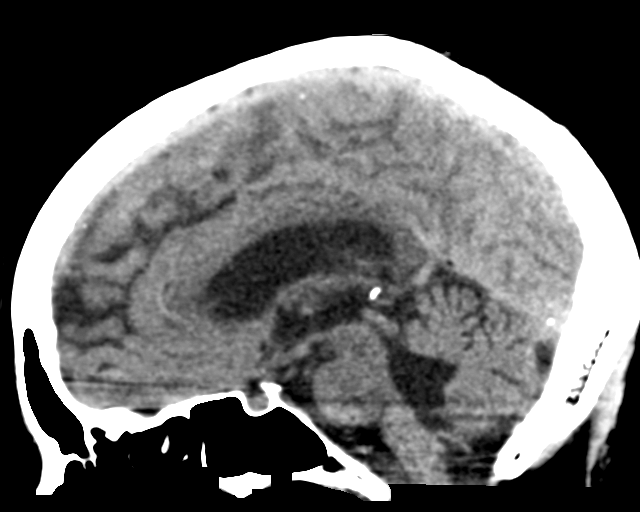
[im 32/48  brain]
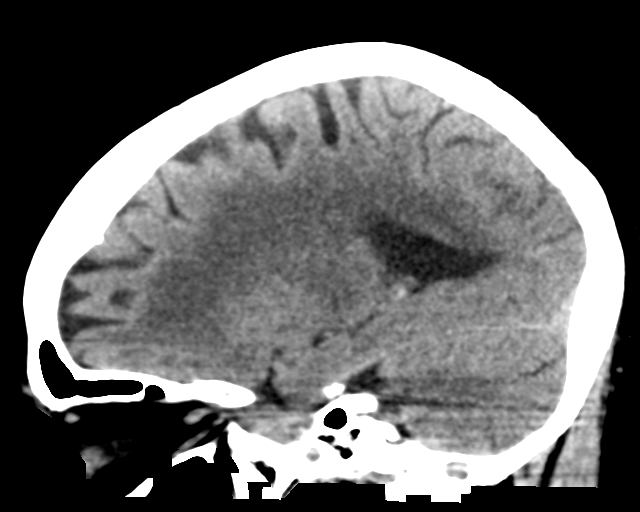

[15 of 45 positions shown; findings below may reference images not displayed]

FINDINGS: BRAIN: No intraparenchymal hemorrhage, mass effect nor midline
shift. The ventricles and sulci are normal for age. Mild
disproportionate cerebellar atrophy. Confluent supratentorial white
matter hypodensities. No acute large vascular territory infarcts. No
abnormal extra-axial fluid collections. Basal cisterns are patent.
Thickened pituitary stalk corresponding to known infundibular mass.

VASCULAR: Mildly dense RIGHT greater than LEFT MCA. Mild calcific
atherosclerosis carotid siphon.

SKULL: No skull fracture. No significant scalp soft tissue swelling.

SINUSES/ORBITS: The mastoid air-cells and included paranasal sinuses
are well-aerated.The included ocular globes and orbital contents are
non-suspicious. Status post bilateral ocular lens implants.

OTHER: None.

ASPECTS (Alberta Stroke Program Early CT Score)

- Ganglionic level infarction (caudate, lentiform nuclei, internal
capsule, insula, M1-M3 cortex): 7

- Supraganglionic infarction (M4-M6 cortex): 3

Total score (0-10 with 10 being normal): 10
IMPRESSION: 1. Mildly dense MCA favoring hemoconcentration, unlikely to reflect
bilateral thromboembolism.
2. Stable examination including moderate to severe to severe chronic
small vessel ischemic changes.
3. ASPECTS is 10.
4. Critical Value/emergent results were called by telephone at the
time of interpretation on 07/22/2017 at [DATE] to Dr. LUMTUR KEVI
, who verbally acknowledged these results.

## 2019-09-05 ENCOUNTER — Other Ambulatory Visit: Payer: Self-pay

## 2019-09-05 DIAGNOSIS — I2510 Atherosclerotic heart disease of native coronary artery without angina pectoris: Secondary | ICD-10-CM | POA: Insufficient documentation

## 2019-09-05 DIAGNOSIS — Z79899 Other long term (current) drug therapy: Secondary | ICD-10-CM | POA: Insufficient documentation

## 2019-09-05 DIAGNOSIS — R55 Syncope and collapse: Secondary | ICD-10-CM | POA: Insufficient documentation

## 2019-09-05 DIAGNOSIS — E1165 Type 2 diabetes mellitus with hyperglycemia: Secondary | ICD-10-CM | POA: Diagnosis not present

## 2019-09-05 DIAGNOSIS — N183 Chronic kidney disease, stage 3 unspecified: Secondary | ICD-10-CM | POA: Insufficient documentation

## 2019-09-05 LAB — BASIC METABOLIC PANEL
Anion gap: 13 (ref 5–15)
BUN: 37 mg/dL — ABNORMAL HIGH (ref 8–23)
CO2: 22 mmol/L (ref 22–32)
Calcium: 8.9 mg/dL (ref 8.9–10.3)
Chloride: 100 mmol/L (ref 98–111)
Creatinine, Ser: 1.98 mg/dL — ABNORMAL HIGH (ref 0.44–1.00)
GFR calc Af Amer: 27 mL/min — ABNORMAL LOW (ref >60)
GFR calc non Af Amer: 23 mL/min — ABNORMAL LOW (ref >60)
Glucose, Bld: 335 mg/dL — ABNORMAL HIGH (ref 70–99)
Potassium: 3.9 mmol/L (ref 3.5–5.1)
Sodium: 135 mmol/L (ref 135–145)

## 2019-09-05 LAB — CBC
HCT: 42.9 % (ref 36.0–46.0)
Hemoglobin: 13.9 g/dL (ref 12.0–15.0)
MCH: 26.3 pg (ref 26.0–34.0)
MCHC: 32.4 g/dL (ref 30.0–36.0)
MCV: 81.1 fL (ref 80.0–100.0)
Platelets: 255 10*3/uL (ref 150–400)
RBC: 5.29 MIL/uL — ABNORMAL HIGH (ref 3.87–5.11)
RDW: 15.4 % (ref 11.5–15.5)
WBC: 9.3 10*3/uL (ref 4.0–10.5)
nRBC: 0 % (ref 0.0–0.2)

## 2019-09-05 LAB — GLUCOSE, CAPILLARY: Glucose-Capillary: 312 mg/dL — ABNORMAL HIGH (ref 70–99)

## 2019-09-05 NOTE — ED Triage Notes (Addendum)
Pt had near syncope at [facility], EMS called. Pt hypotensive on EMS arrival systolic 90's. Pt hyperglycemic, 437. Pt had approx 400cc NS en route with EMS. Pt alert and oriented X4, cooperative, RR even and unlabored, color WNL. Pt in NAD.   Pt states her blood sugar is high due to drinking orange juice when she felt near syncopal and missing her second dose of insulin tonight.

## 2019-09-06 ENCOUNTER — Emergency Department: Payer: Medicare HMO

## 2019-09-06 ENCOUNTER — Emergency Department
Admission: EM | Admit: 2019-09-06 | Discharge: 2019-09-06 | Disposition: A | Discharge status: Home / Self Care | Payer: Medicare HMO | Attending: Emergency Medicine | Admitting: Emergency Medicine

## 2019-09-06 DIAGNOSIS — R55 Syncope and collapse: Secondary | ICD-10-CM

## 2019-09-06 DIAGNOSIS — R739 Hyperglycemia, unspecified: Secondary | ICD-10-CM

## 2019-09-06 LAB — URINALYSIS, COMPLETE (UACMP) WITH MICROSCOPIC
Bilirubin Urine: NEGATIVE
Glucose, UA: >=500 mg/dL — AB
Hgb urine dipstick: NEGATIVE
Ketones, ur: 5 mg/dL — AB
Nitrite: NEGATIVE
Protein, ur: 30 mg/dL — AB
Specific Gravity, Urine: 1.015 (ref 1.005–1.030)
pH: 5 (ref 5.0–8.0)

## 2019-09-06 LAB — TROPONIN I (HIGH SENSITIVITY): Troponin I (High Sensitivity): 14 ng/L (ref <18)

## 2019-09-06 MED ORDER — SODIUM CHLORIDE 0.9 % IV BOLUS
1000.0000 mL | Freq: Once | INTRAVENOUS | Status: AC
  Administered 2019-09-06: 1000 mL via INTRAVENOUS

## 2019-09-06 NOTE — ED Provider Notes (Signed)
[facility] Emergency Department Provider Note  ____________________________________________   I have reviewed the triage vital signs and the nursing notes.   HISTORY  Chief Complaint Hyperglycemia, Hypotension, and Near Syncope   HPI Laura Hatfield is a 79 y.o. female   with below list of previous medical conditions including TIA chronic kidney disease CAD diabetes and hyperlipidemia presents to the emergency department following a near syncopal episode which occurred in [employer] yesterday evening.  Patient states that she suddenly felt as though she was going to pass out while in the checkout line.  Patient denied any preceding symptoms no chest pain shortness of breath palpitation headache nausea or vomiting.  Patient denied any focal weakness numbness or visual changes.  Patient states that she has had a similar episode last week when her blood pressure became remarkably low at home.  On EMS arrival patient systolic blood pressure was in the 90s" patient noted to be hypoglycemic for EMS with a glucose of 437 for which she was given 400 cc of normal saline in route to the emergency department.  Patient states that grocery store staff gave her a bottle of orange juice to drink when she informed that she felt as though she was going to pass out.  Patient's glucose on arrival to the emergency department 335.  Patient without any complaints at present.        Past Medical History:  Diagnosis Date  . CAD (coronary artery disease)   . Diabetes mellitus without complication (HCC)   . Hyperlipidemia   . Thyroid nodule     Patient Active Problem List   Diagnosis Date Noted  . Hypertensive urgency 07/26/2017  . Aphasia 05/01/2017  . HLD (hyperlipidemia) 05/01/2017  . Diabetes (HCC) 05/01/2017  . CAD (coronary artery disease) 05/01/2017  . CKD (chronic kidney disease), stage III 05/01/2017  . TIA (transient ischemic attack) 03/21/2016    Past Surgical  History:  Procedure Laterality Date  . ANKLE SURGERY    . CHOLECYSTECTOMY    . EYE SURGERY    . HUMERUS SURGERY      Prior to Admission medications   Medication Sig Start Date End Date Taking? Authorizing Provider  acetaminophen (TYLENOL) 500 MG tablet Take by mouth.    [provider]  aspirin 325 MG tablet Take 325 mg by mouth at bedtime.    [provider]  cholecalciferol (VITAMIN D) 1000 units tablet Take 1,000 Units by mouth at bedtime.     [provider]  ciprofloxacin (CIPRO) 500 MG tablet Take 1 tablet (500 mg total) by mouth 2 (two) times daily. 01/24/19   Lutricia Feil, PA-C  clopidogrel (PLAVIX) 75 MG tablet Take 75 mg by mouth at bedtime.     [provider]  Dulaglutide (TRULICITY) 0.75 MG/0.5ML SOPN Inject 0.75 mg into the skin every Sunday.     [provider]  escitalopram (LEXAPRO) 5 MG tablet Take 5 mg by mouth daily. 12/15/17   [provider]  furosemide (LASIX) 40 MG tablet Take 60 mg by mouth daily as needed for fluid.  01/04/15   [provider]  gabapentin (NEURONTIN) 100 MG capsule Take 100 mg by mouth at bedtime as needed (for pain).     [provider]  hydrocortisone 2.5 % lotion Apply topically 2 (two) times daily. 02/25/18   Lutricia Feil, PA-C  insulin NPH-regular Human (NOVOLIN 70/30) (70-30) 100 UNIT/ML injection Inject 25-30 Units into the skin See admin  instructions. 30 units every morning and 25 units every evening    [provider]  ipratropium (ATROVENT) 0.03 % nasal spray Place into the nose. 01/14/18 01/14/19  [provider]  metoprolol succinate (TOPROL-XL) 100 MG 24 hr tablet Take 150 mg by mouth at bedtime.     [provider]  nitroGLYCERIN (NITROSTAT) 0.4 MG SL tablet Place 1 tablet under the tongue every 5 (five) minutes as needed for chest pain. 03/30/14   [provider]  REPATHA SURECLICK 140 MG/ML SOAJ  10/25/18   [provider]  vitamin B-12 (CYANOCOBALAMIN) 1000 MCG tablet Take 2,000 mcg by mouth at bedtime.    [provider]  rosuvastatin (CRESTOR) 40 MG tablet Take 20 mg by mouth at bedtime.   01/24/19  [provider]    Allergies Atorvastatin, Crestor [rosuvastatin], Lipitor [atorvastatin calcium], Lisinopril, Losartan, and Penicillins  Family History  Problem Relation Age of Onset  . Hypertension Mother   . Diabetes Mother   . Hypertension Father   . Stroke Father     Social History Social History   Tobacco Use  . Smoking status: Never Smoker  . Smokeless tobacco: Never Used  Vaping Use  . Vaping Use: Never used  Substance Use Topics  . Alcohol use: No  . Drug use: No    Review of Systems Constitutional: No fever/chills Eyes: No visual changes. ENT: No sore throat. Cardiovascular: Denies chest pain. Respiratory: Denies shortness of breath. Gastrointestinal: No abdominal pain.  No nausea, no vomiting.  No diarrhea.  No constipation. Genitourinary: Negative for dysuria. Musculoskeletal: Negative for neck pain.  Negative for back pain. Integumentary: Negative for rash. Neurological: Negative for headaches, focal weakness or numbness.  Positive for near syncopal episode now resolved  ____________________________________________   PHYSICAL EXAM:  VITAL SIGNS: ED Triage Vitals [09/05/19 2217]  Enc Vitals Group     BP (!) 144/77     Pulse Rate 78     Resp 18     Temp 98.7 F (37.1 C)     Temp Source Oral     SpO2 97 %     Weight 104.3 kg (230 lb)     Height 1.702 m (5\' 7" )     Head Circumference      Peak Flow      Pain Score 0     Pain Loc      Pain Edu?      Excl. in GC?     Constitutional: Alert and oriented.  Eyes: Conjunctivae are normal.  Head: Atraumatic. Mouth/Throat: Patient is wearing a mask. Neck: No stridor.  No meningeal signs.   Cardiovascular: Normal rate, regular rhythm. Good peripheral circulation. Grossly normal heart  sounds. Respiratory: Normal respiratory effort.  No retractions. Gastrointestinal: Soft and nontender. No distention.  Musculoskeletal: No lower extremity tenderness nor edema. No gross deformities of extremities. Neurologic:  Normal speech and language. No gross focal neurologic deficits are appreciated.  Skin:  Skin is warm, dry and intact. Psychiatric: Mood and affect are normal. Speech and behavior are normal.  ____________________________________________   LABS (all labs ordered are listed, but only abnormal results are displayed)  Labs Reviewed  BASIC METABOLIC PANEL - Abnormal; Notable for the following components:      Result Value   Glucose, Bld 335 (*)    BUN 37 (*)    Creatinine, Ser 1.98 (*)    GFR calc non Af Amer 23 (*)    GFR calc Af Amer 27 (*)  All other components within normal limits  CBC - Abnormal; Notable for the following components:   RBC 5.29 (*)    All other components within normal limits  GLUCOSE, CAPILLARY - Abnormal; Notable for the following components:   Glucose-Capillary 312 (*)    All other components within normal limits  URINALYSIS, COMPLETE (UACMP) WITH MICROSCOPIC  CBG MONITORING, ED  TROPONIN I (HIGH SENSITIVITY)   ____________________________________________  EKG  ED ECG REPORT I, Jack N Venicia Vandall, the attending physician, personally viewed and interpreted this ECG.   Date: 09/05/2019  EKG Time: 10:20 PM  Rate: 84  Rhythm: Normal sinus rhythm  Axis: Normal  Intervals: Normal  ST&T Change: None  ____________________________________________  RADIOLOGY I, Dav[place] N Zacharia Sowles, personally viewed and evaluated these images (plain radiographs) as part of my medical decision making, as well as reviewing the written report by the radiologist.  ED MD interpretation: No acute findings noted on CT head  Official radiology report(s): CT Head Wo Contrast  Result Date: 09/06/2019 CLINICAL DATA:  Near syncopal episode. Hypotensive. EXAM:  CT HEAD WITHOUT CONTRAST TECHNIQUE: Contiguous axial images were obtained from the base of the skull through the vertex without intravenous contrast. COMPARISON:  11/30/2017 FINDINGS: Brain: Stable age related cerebral atrophy, ventriculomegaly and significant periventricular white matter disease. No extra-axial fluid collections are identified. No CT findings for acute hemispheric infarction or intracranial hemorrhage. No mass lesions. The brainstem and cerebellum are normal. Vascular: Stable vascular calcifications. No aneurysm or hyperdense vessels. Skull: No skull fracture or bone lesions. Sinuses/Orbits: The paranasal sinuses and mastoid air cells are clear. The globes are intact. Other: No scalp lesions or scalp hematoma. IMPRESSION: 1. Stable age related cerebral atrophy, ventriculomegaly and significant periventricular white matter disease. 2. No acute intracranial findings or mass lesions. Electronically Signed   By: Rudie Meyer M.D.   On: 09/06/2019 06:22    ____________________________________________    Procedures   ____________________________________________   INITIAL IMPRESSION / MDM / ASSESSMENT AND PLAN / ED COURSE  As part of my medical decision making, I reviewed the following data within the electronic MEDICAL RECORD NUMBER   79 year old female presented with above-stated history and physical exam a differential diagnosis including but not limited to CVA arrhythmia electrolyte imbalance including hyponatremia, orthostatic hypotension, dehydration.  Laboratory data notable for glucose of 335 with a creatinine of 1.98 which is increased from the patient's previous of 1.12 November 2018.  Patient receiving 1 L of IV normal saline now.  CT head revealed no acute intracranial abnormality.  Urinalysis pending and if negative anticipate the patient will be admitted to be discharged home.  Patient's care transferred to Dr. Mayford Knife  ____________________________________________  FINAL  CLINICAL IMPRESSION(S) / ED DIAGNOSES  Final diagnoses:  Near syncope  Hyperglycemia     MEDICATIONS GIVEN DURING THIS VISIT:  Medications  sodium chloride 0.9 % bolus 1,000 mL (1,000 mLs Intravenous New Bag/Given 09/06/19 1610)     ED Discharge Orders    None      *Please note:  Laura Hatfield was evaluated in Emergency Department on 09/06/2019 for the symptoms described in the history of present illness. She was evaluated in the context of the global COVID-19 pandemic, which necessitated consideration that the patient might be at risk for infection with the SARS-CoV-2 virus that causes COVID-19. Institutional protocols and algorithms that pertain to the evaluation of patients at risk for COVID-19 are in a state of rapid change based on information released by regulatory bodies including the CDC  and federal and state organizations. These policies and algorithms were followed during the patient's care in the ED.  Some ED evaluations and interventions may be delayed as a result of limited staffing during and after the pandemic.*  Note:  This document was prepared using Dragon voice recognition software and may include unintentional dictation errors.   Darci Current, MD 09/06/19 [phone]

## 2019-09-06 NOTE — ED Provider Notes (Signed)
Patient is in no acute distress, does appear somewhat dehydrated for which she received IV fluids.  She has no urinary symptoms but urine does appear to be infected.  I will send a urine culture before treatment.   Emily Filbert, MD 09/06/19 [phone]

## 2019-09-07 LAB — URINE CULTURE: Culture: <10000 — AB

## 2019-12-14 ENCOUNTER — Ambulatory Visit
Admission: EM | Admit: 2019-12-14 | Discharge: 2019-12-14 | Disposition: A | Discharge status: Home / Self Care | Payer: Medicare HMO | Attending: Family Medicine | Admitting: Family Medicine

## 2019-12-14 ENCOUNTER — Other Ambulatory Visit: Payer: Self-pay

## 2019-12-14 ENCOUNTER — Encounter: Payer: Self-pay | Admitting: Emergency Medicine

## 2019-12-14 DIAGNOSIS — N3001 Acute cystitis with hematuria: Secondary | ICD-10-CM | POA: Insufficient documentation

## 2019-12-14 LAB — URINALYSIS, COMPLETE (UACMP) WITH MICROSCOPIC
Glucose, UA: NEGATIVE mg/dL
Ketones, ur: 15 mg/dL — AB
Nitrite: POSITIVE — AB
Protein, ur: >300 mg/dL — AB
Specific Gravity, Urine: >1.03 — ABNORMAL HIGH (ref 1.005–1.030)
WBC, UA: >50 WBC/hpf (ref 0–5)
pH: 5 (ref 5.0–8.0)

## 2019-12-14 MED ORDER — SULFAMETHOXAZOLE-TRIMETHOPRIM 800-160 MG PO TABS: 1.0000 | ORAL_TABLET | Freq: Two times a day (BID) | ORAL | 0 refills | Status: AC

## 2019-12-14 NOTE — ED Triage Notes (Signed)
Patient c/o dysuria and urinary frequency that started 1 week ago. She also reports itching that started 1 week ago due to wearing a different version of Depends.

## 2019-12-14 NOTE — ED Provider Notes (Signed)
[facility]    CSN: [id number] Arrival date & time: 12/14/19  1054   History   Chief Complaint Chief Complaint  Patient presents with  . Dysuria  . Urinary Frequency   HPI  79 year old female presents with the above complaints.  1 week history of dysuria and urinary frequency.  Patient states that she is concerned that she has UTI.  Patient states that she has had some vaginal itching as well.  She states that this occurred after she switched undergarments.  No relieving factors.  She reports some mild suprapubic discomfort.  No back pain or flank pain.  No other associated symptoms.  No other complaints.   Past Medical History:  Diagnosis Date  . CAD (coronary artery disease)   . Diabetes mellitus without complication (HCC)   . Hyperlipidemia   . Thyroid nodule    Patient Active Problem List   Diagnosis Date Noted  . Hypertensive urgency 07/26/2017  . Aphasia 05/01/2017  . HLD (hyperlipidemia) 05/01/2017  . Diabetes (HCC) 05/01/2017  . CAD (coronary artery disease) 05/01/2017  . CKD (chronic kidney disease), stage III (HCC) 05/01/2017  . TIA (transient ischemic attack) 03/21/2016    Past Surgical History:  Procedure Laterality Date  . ANKLE SURGERY    . CHOLECYSTECTOMY    . EYE SURGERY    . HUMERUS SURGERY      OB History   No obstetric history on file.      Home Medications    Prior to Admission medications   Medication Sig Start Date End Date Taking? Authorizing Provider  acetaminophen (TYLENOL) 500 MG tablet Take by mouth.   Yes [provider]  cholecalciferol (VITAMIN D) 1000 units tablet Take 1,000 Units by mouth at bedtime.    Yes [provider]  clopidogrel (PLAVIX) 75 MG tablet Take 75 mg by mouth at bedtime.    Yes [provider]  insulin NPH-regular Human (NOVOLIN 70/30) (70-30) 100 UNIT/ML injection Inject 25-30 Units into the skin See admin instructions. 30 units every morning and 25 units every evening    Yes [provider]  nitroGLYCERIN (NITROSTAT) 0.4 MG SL tablet Place 1 tablet under the tongue every 5 (five) minutes as needed for chest pain. 03/30/14  Yes [provider]  REPATHA SURECLICK 140 MG/ML SOAJ  10/25/18  Yes [provider]  escitalopram (LEXAPRO) 5 MG tablet Take 5 mg by mouth daily. 12/15/17 12/14/19 Yes [provider]  furosemide (LASIX) 40 MG tablet Take 60 mg by mouth daily as needed for fluid.  01/04/15 12/14/19 Yes [provider]  gabapentin (NEURONTIN) 100 MG capsule Take 100 mg by mouth at bedtime as needed (for pain).   12/14/19 Yes [provider]  metoprolol succinate (TOPROL-XL) 100 MG 24 hr tablet Take 150 mg by mouth at bedtime.   12/14/19 Yes [provider]  ipratropium (ATROVENT) 0.03 % nasal spray Place into the nose. 01/14/18 01/14/19  [provider]  sulfamethoxazole-trimethoprim (BACTRIM DS) 800-160 MG tablet Take 1 tablet by mouth 2 (two) times daily for 7 days. 12/14/19 12/21/19  Tommie Sams, DO  rosuvastatin (CRESTOR) 40 MG tablet Take 20 mg by mouth at bedtime.   01/24/19  [provider]    Family History Family History  Problem Relation Age of Onset  . Hypertension Mother   . Diabetes Mother   . Hypertension Father   . Stroke Father     Social History Social History   Tobacco Use  .  Smoking status: Never Smoker  . Smokeless tobacco: Never Used  Vaping Use  . Vaping Use: Never used  Substance Use Topics  . Alcohol use: No  . Drug use: No     Allergies   Atorvastatin, Crestor [rosuvastatin], Lipitor [atorvastatin calcium], Lisinopril, Losartan, and Penicillins   Review of Systems Review of Systems  Constitutional: Negative.   Genitourinary: Positive for dysuria and frequency.       Vaginal itching.   Physical Exam Triage Vital Signs ED Triage Vitals  Enc Vitals Group     BP 12/14/19 1142 120/69     Pulse Rate 12/14/19 1142 65     Resp  12/14/19 1142 18     Temp 12/14/19 1142 98.4 F (36.9 C)     Temp Source 12/14/19 1142 Oral     SpO2 12/14/19 1142 100 %     Weight 12/14/19 1142 210 lb (95.3 kg)     Height 12/14/19 1142 5\' 7"  (1.702 m)     Head Circumference --      Peak Flow --      Pain Score 12/14/19 1141 0     Pain Loc --      Pain Edu? --      Excl. in GC? --    Updated Vital Signs BP 120/69 (BP Location: Right Arm)   Pulse 65   Temp 98.4 F (36.9 C) (Oral)   Resp 18   Ht 5\' 7"  (1.702 m)   Wt 95.3 kg   SpO2 100%   BMI 32.89 kg/m   Visual Acuity Right Eye Distance:   Left Eye Distance:   Bilateral Distance:    Right Eye Near:   Left Eye Near:    Bilateral Near:     Physical Exam Vitals and nursing note reviewed.  Constitutional:      General: She is not in acute distress.    Appearance: Normal appearance. She is not ill-appearing.  HENT:     Head: Normocephalic and atraumatic.  Eyes:     General:        Right eye: No discharge.        Left eye: No discharge.     Conjunctiva/sclera: Conjunctivae normal.  Cardiovascular:     Rate and Rhythm: Normal rate and regular rhythm.  Pulmonary:     Effort: Pulmonary effort is normal.     Breath sounds: Normal breath sounds.  Abdominal:     General: There is no distension.     Palpations: Abdomen is soft.     Tenderness: There is no abdominal tenderness.  Neurological:     Mental Status: She is alert.  Psychiatric:        Mood and Affect: Mood normal.        Behavior: Behavior normal.    UC Treatments / Results  Labs (all labs ordered are listed, but only abnormal results are displayed) Labs Reviewed  URINALYSIS, COMPLETE (UACMP) WITH MICROSCOPIC - Abnormal; Notable for the following components:      Result Value   APPearance CLOUDY (*)    Specific Gravity, Urine >1.030 (*)    Hgb urine dipstick MODERATE (*)    Bilirubin Urine SMALL (*)    Ketones, ur 15 (*)    Protein, ur >300 (*)    Nitrite POSITIVE (*)    Leukocytes,Ua LARGE (*)     Bacteria, UA MANY (*)    All other components within normal limits    EKG   Radiology No results found.  Procedures Procedures (including critical care time)  Medications Ordered in UC Medications - No data to display  Initial Impression / Assessment and Plan / UC Course  I have reviewed the triage vital signs and the nursing notes.  Pertinent labs & imaging results that were available during my care of the patient were reviewed by me and considered in my medical decision making (see chart for details).    79 year old female presents with UTI.  Awaiting culture.  Placing on Bactrim.  Final Clinical Impressions(s) / UC Diagnoses   Final diagnoses:  Acute cystitis with hematuria   Discharge Instructions   None    ED Prescriptions    Medication Sig Dispense Auth. Provider   sulfamethoxazole-trimethoprim (BACTRIM DS) 800-160 MG tablet Take 1 tablet by mouth 2 (two) times daily for 7 days. 14 tablet Tommie Sams, DO     PDMP not reviewed this encounter.   Tommie Sams, [place] 12/14/19 1234

## 2020-03-06 ENCOUNTER — Other Ambulatory Visit: Payer: Self-pay

## 2020-03-06 ENCOUNTER — Ambulatory Visit
Admission: EM | Admit: 2020-03-06 | Discharge: 2020-03-06 | Disposition: A | Discharge status: Home / Self Care | Payer: Medicare HMO | Attending: Sports Medicine | Admitting: Sports Medicine

## 2020-03-06 DIAGNOSIS — N3000 Acute cystitis without hematuria: Secondary | ICD-10-CM | POA: Insufficient documentation

## 2020-03-06 LAB — URINALYSIS, COMPLETE (UACMP) WITH MICROSCOPIC
Bilirubin Urine: NEGATIVE
Glucose, UA: 500 mg/dL — AB
Ketones, ur: NEGATIVE mg/dL
Nitrite: POSITIVE — AB
Protein, ur: 100 mg/dL — AB
Specific Gravity, Urine: 1.025 (ref 1.005–1.030)
WBC, UA: >50 WBC/hpf (ref 0–5)
pH: 5.5 (ref 5.0–8.0)

## 2020-03-06 MED ORDER — NITROFURANTOIN MONOHYD MACRO 100 MG PO CAPS: 100.0000 mg | ORAL_CAPSULE | Freq: Two times a day (BID) | ORAL | 0 refills | Status: DC

## 2020-03-06 MED ORDER — PHENAZOPYRIDINE HCL 200 MG PO TABS: 200.0000 mg | ORAL_TABLET | Freq: Three times a day (TID) | ORAL | 0 refills | Status: DC

## 2020-03-06 NOTE — ED Triage Notes (Signed)
Patient states that she has been having some urinary frequency and urgency. States that she has been having some incontinence. Reports that her symptoms started around 3 days ago.

## 2020-03-06 NOTE — Discharge Instructions (Addendum)
The Macrobid twice daily with food for 5 days.  Use the Pyridium every 8 hours as needed for urinary discomfort.  This will turn your pee bright red-orange.  We will send your urine for culture and adjust the antibiotics if we need to based on those results.  If your symptoms do not improve, or worsen, return for reevaluation or see your primary care provider.

## 2020-03-06 NOTE — ED Provider Notes (Signed)
[facility]    CSN: [id number] Arrival date & time: 03/06/20  1400      History   Chief Complaint Chief Complaint  Patient presents with  . Urinary Frequency    HPI Corynn Solberg is a 80 y.o. female.   HPI   80 year old female here for evaluation of urinary urgency, frequency, and incontinence that started 3 days ago.  Patient reports that she does have some stinging at the end of urination and that she has been going more at nighttime.  Patient reports that she has seen some cloudiness to her urine today.  Patient denies fever, blood in her urine, back pain, abdominal pain, or nausea.  Past Medical History:  Diagnosis Date  . CAD (coronary artery disease)   . Diabetes mellitus without complication (HCC)   . Hyperlipidemia   . Thyroid nodule     Patient Active Problem List   Diagnosis Date Noted  . Hypertensive urgency 07/26/2017  . Aphasia 05/01/2017  . HLD (hyperlipidemia) 05/01/2017  . Diabetes (HCC) 05/01/2017  . CAD (coronary artery disease) 05/01/2017  . CKD (chronic kidney disease), stage III (HCC) 05/01/2017  . TIA (transient ischemic attack) 03/21/2016    Past Surgical History:  Procedure Laterality Date  . ANKLE SURGERY    . CHOLECYSTECTOMY    . EYE SURGERY    . HUMERUS SURGERY      OB History   No obstetric history on file.      Home Medications    Prior to Admission medications   Medication Sig Start Date End Date Taking? Authorizing Provider  acetaminophen (TYLENOL) 500 MG tablet Take by mouth.   Yes [provider]  cholecalciferol (VITAMIN D) 1000 units tablet Take 1,000 Units by mouth at bedtime.    Yes [provider]  clopidogrel (PLAVIX) 75 MG tablet Take 75 mg by mouth at bedtime.    Yes [provider]  insulin NPH-regular Human (NOVOLIN 70/30) (70-30) 100 UNIT/ML injection Inject 25-30 Units into the skin See admin instructions. 30 units every morning and 25 units every evening   Yes  [provider]  ipratropium (ATROVENT) 0.03 % nasal spray Place into the nose. 01/14/18 03/06/20 Yes [provider]  nitrofurantoin, macrocrystal-monohydrate, (MACROBID) 100 MG capsule Take 1 capsule (100 mg total) by mouth 2 (two) times daily. 03/06/20  Yes Becky Augusta, NP  nitroGLYCERIN (NITROSTAT) 0.4 MG SL tablet Place 1 tablet under the tongue every 5 (five) minutes as needed for chest pain. 03/30/14  Yes [provider]  phenazopyridine (PYRIDIUM) 200 MG tablet Take 1 tablet (200 mg total) by mouth 3 (three) times daily. 03/06/20  Yes Becky Augusta, NP  REPATHA SURECLICK 140 MG/ML SOAJ  10/25/18  Yes [provider]  escitalopram (LEXAPRO) 5 MG tablet Take 5 mg by mouth daily. 12/15/17 12/14/19  [provider]  furosemide (LASIX) 40 MG tablet Take 60 mg by mouth daily as needed for fluid.  01/04/15 12/14/19  [provider]  gabapentin (NEURONTIN) 100 MG capsule Take 100 mg by mouth at bedtime as needed (for pain).   12/14/19  [provider]  metoprolol succinate (TOPROL-XL) 100 MG 24 hr tablet Take 150 mg by mouth at bedtime.   12/14/19  [provider]  rosuvastatin (CRESTOR) 40 MG tablet Take 20 mg by mouth at bedtime.   01/24/19  [provider]    Family History Family History  Problem Relation Age of Onset  . Hypertension Mother   .  Diabetes Mother   . Hypertension Father   . Stroke Father     Social History Social History   Tobacco Use  . Smoking status: Never Smoker  . Smokeless tobacco: Never Used  Vaping Use  . Vaping Use: Never used  Substance Use Topics  . Alcohol use: No  . Drug use: No     Allergies   Atorvastatin, Crestor [rosuvastatin], Lipitor [atorvastatin calcium], Lisinopril, Losartan, and Penicillins   Review of Systems Review of Systems  Constitutional: Negative for activity change, appetite change and fever.  Gastrointestinal: Positive for diarrhea. Negative for  abdominal pain, nausea and vomiting.  Genitourinary: Positive for dysuria, frequency and urgency. Negative for hematuria.  Musculoskeletal: Negative for back pain.  Skin: Negative for rash.  Hematological: Negative.   Psychiatric/Behavioral: Negative.      Physical Exam Triage Vital Signs ED Triage Vitals  Enc Vitals Group     BP      Pulse      Resp      Temp      Temp src      SpO2      Weight      Height      Head Circumference      Peak Flow      Pain Score      Pain Loc      Pain Edu?      Excl. in GC?    No data found.  Updated Vital Signs BP (!) 149/74 (BP Location: Right Arm)   Pulse 79   Temp 98.2 F (36.8 C) (Oral)   Resp 15   Ht 5\' 7"  (1.702 m)   Wt 210 lb 1.6 oz (95.3 kg)   SpO2 99%   BMI 32.91 kg/m   Visual Acuity Right Eye Distance:   Left Eye Distance:   Bilateral Distance:    Right Eye Near:   Left Eye Near:    Bilateral Near:     Physical Exam Vitals and nursing note reviewed.  Constitutional:      General: She is not in acute distress.    Appearance: Normal appearance.  HENT:     Head: Normocephalic and atraumatic.  Cardiovascular:     Rate and Rhythm: Normal rate and regular rhythm.     Pulses: Normal pulses.     Heart sounds: Normal heart sounds. No murmur heard. No gallop.   Pulmonary:     Effort: Pulmonary effort is normal.     Breath sounds: Normal breath sounds. No wheezing, rhonchi or rales.  Abdominal:     Tenderness: There is no right CVA tenderness or left CVA tenderness.  Skin:    General: Skin is warm and dry.     Capillary Refill: Capillary refill takes less than 2 seconds.     Findings: No erythema or rash.  Neurological:     General: No focal deficit present.     Mental Status: She is alert and oriented to person, place, and time.  Psychiatric:        Mood and Affect: Mood normal.        Behavior: Behavior normal.        Thought Content: Thought content normal.        Judgment: Judgment normal.       UC Treatments / Results  Labs (all labs ordered are listed, but only abnormal results are displayed) Labs Reviewed  URINALYSIS, COMPLETE (UACMP) WITH MICROSCOPIC - Abnormal; Notable for the following components:  Result Value   APPearance CLOUDY (*)    Glucose, UA 500 (*)    Hgb urine dipstick TRACE (*)    Protein, ur 100 (*)    Nitrite POSITIVE (*)    Leukocytes,Ua SMALL (*)    Bacteria, UA MANY (*)    All other components within normal limits  URINE CULTURE    EKG   Radiology No results found.  Procedures Procedures (including critical care time)  Medications Ordered in UC Medications - No data to display  Initial Impression / Assessment and Plan / UC Course  I have reviewed the triage vital signs and the nursing notes.  Pertinent labs & imaging results that were available during my care of the patient were reviewed by me and considered in my medical decision making (see chart for details).   For evaluation of UTI symptoms that started 3 days ago.  Patient is not ill or toxic in appearance.  Patient's physical exam does not reveal any abdominal pain, or CVA tenderness.  UA is cloudy in appearance with 500 glucose, trace hemoglobin, 100 protein, nitrite positive, small leukocytes, greater than 50 WBCs, and many bacteria.  Will send urine for culture.  Will treat patient with Macrobid twice daily for 5 days and Pyridium to help with urinary discomfort as patient is allergic to penicillins.   Final Clinical Impressions(s) / UC Diagnoses   Final diagnoses:  Acute cystitis without hematuria     Discharge Instructions     The Macrobid twice daily with food for 5 days.  Use the Pyridium every 8 hours as needed for urinary discomfort.  This will turn your pee bright red-orange.  We will send your urine for culture and adjust the antibiotics if we need to based on those results.  If your symptoms do not improve, or worsen, return for reevaluation or see  your primary care provider.    ED Prescriptions    Medication Sig Dispense Auth. Provider   nitrofurantoin, macrocrystal-monohydrate, (MACROBID) 100 MG capsule Take 1 capsule (100 mg total) by mouth 2 (two) times daily. 10 capsule Becky Augusta, NP   phenazopyridine (PYRIDIUM) 200 MG tablet Take 1 tablet (200 mg total) by mouth 3 (three) times daily. 6 tablet Becky Augusta, NP     PDMP not reviewed this encounter.   Becky Augusta, NP 03/06/20 1700

## 2020-03-09 LAB — URINE CULTURE
Culture: >=100000 — AB
Special Requests: NORMAL

## 2020-03-13 ENCOUNTER — Telehealth (HOSPITAL_COMMUNITY): Payer: Self-pay | Admitting: Emergency Medicine

## 2020-03-13 MED ORDER — SULFAMETHOXAZOLE-TRIMETHOPRIM 800-160 MG PO TABS: 1.0000 | ORAL_TABLET | Freq: Two times a day (BID) | ORAL | 0 refills | Status: AC

## 2020-07-31 ENCOUNTER — Emergency Department: Payer: Medicare HMO

## 2020-07-31 ENCOUNTER — Emergency Department
Admission: EM | Admit: 2020-07-31 | Discharge: 2020-07-31 | Disposition: A | Discharge status: Home / Self Care | Payer: Medicare HMO | Attending: Emergency Medicine | Admitting: Emergency Medicine

## 2020-07-31 DIAGNOSIS — R739 Hyperglycemia, unspecified: Secondary | ICD-10-CM

## 2020-07-31 DIAGNOSIS — Z20822 Contact with and (suspected) exposure to covid-19: Secondary | ICD-10-CM | POA: Diagnosis not present

## 2020-07-31 DIAGNOSIS — E1165 Type 2 diabetes mellitus with hyperglycemia: Secondary | ICD-10-CM | POA: Diagnosis not present

## 2020-07-31 DIAGNOSIS — E1122 Type 2 diabetes mellitus with diabetic chronic kidney disease: Secondary | ICD-10-CM | POA: Insufficient documentation

## 2020-07-31 DIAGNOSIS — R531 Weakness: Secondary | ICD-10-CM | POA: Diagnosis not present

## 2020-07-31 DIAGNOSIS — H532 Diplopia: Secondary | ICD-10-CM | POA: Diagnosis not present

## 2020-07-31 DIAGNOSIS — R110 Nausea: Secondary | ICD-10-CM | POA: Diagnosis not present

## 2020-07-31 DIAGNOSIS — H538 Other visual disturbances: Secondary | ICD-10-CM | POA: Diagnosis not present

## 2020-07-31 DIAGNOSIS — I6782 Cerebral ischemia: Secondary | ICD-10-CM | POA: Insufficient documentation

## 2020-07-31 DIAGNOSIS — R519 Headache, unspecified: Secondary | ICD-10-CM | POA: Diagnosis not present

## 2020-07-31 DIAGNOSIS — N183 Chronic kidney disease, stage 3 unspecified: Secondary | ICD-10-CM | POA: Insufficient documentation

## 2020-07-31 DIAGNOSIS — Z794 Long term (current) use of insulin: Secondary | ICD-10-CM | POA: Diagnosis not present

## 2020-07-31 DIAGNOSIS — I2510 Atherosclerotic heart disease of native coronary artery without angina pectoris: Secondary | ICD-10-CM | POA: Insufficient documentation

## 2020-07-31 LAB — CBC WITH DIFFERENTIAL/PLATELET
Abs Immature Granulocytes: 0.02 10*3/uL (ref 0.00–0.07)
Basophils Absolute: 0.1 10*3/uL (ref 0.0–0.1)
Basophils Relative: 1 %
Eosinophils Absolute: 0.3 10*3/uL (ref 0.0–0.5)
Eosinophils Relative: 3 %
HCT: 42.4 % (ref 36.0–46.0)
Hemoglobin: 13.6 g/dL (ref 12.0–15.0)
Immature Granulocytes: 0 %
Lymphocytes Relative: 52 %
Lymphs Abs: 4.2 10*3/uL — ABNORMAL HIGH (ref 0.7–4.0)
MCH: 26.3 pg (ref 26.0–34.0)
MCHC: 32.1 g/dL (ref 30.0–36.0)
MCV: 81.9 fL (ref 80.0–100.0)
Monocytes Absolute: 0.6 10*3/uL (ref 0.1–1.0)
Monocytes Relative: 7 %
Neutro Abs: 3 10*3/uL (ref 1.7–7.7)
Neutrophils Relative %: 37 %
Platelets: 248 10*3/uL (ref 150–400)
RBC: 5.18 MIL/uL — ABNORMAL HIGH (ref 3.87–5.11)
RDW: 15.1 % (ref 11.5–15.5)
WBC: 8.2 10*3/uL (ref 4.0–10.5)
nRBC: 0 % (ref 0.0–0.2)

## 2020-07-31 LAB — COMPREHENSIVE METABOLIC PANEL
ALT: 17 U/L (ref 0–44)
AST: 20 U/L (ref 15–41)
Albumin: 4 g/dL (ref 3.5–5.0)
Alkaline Phosphatase: 85 U/L (ref 38–126)
Anion gap: 16 — ABNORMAL HIGH (ref 5–15)
BUN: 29 mg/dL — ABNORMAL HIGH (ref 8–23)
CO2: 19 mmol/L — ABNORMAL LOW (ref 22–32)
Calcium: 9.2 mg/dL (ref 8.9–10.3)
Chloride: 102 mmol/L (ref 98–111)
Creatinine, Ser: 1.93 mg/dL — ABNORMAL HIGH (ref 0.44–1.00)
GFR, Estimated: 26 mL/min — ABNORMAL LOW (ref >60)
Glucose, Bld: 198 mg/dL — ABNORMAL HIGH (ref 70–99)
Potassium: 4.2 mmol/L (ref 3.5–5.1)
Sodium: 137 mmol/L (ref 135–145)
Total Bilirubin: 1 mg/dL (ref 0.3–1.2)
Total Protein: 7.4 g/dL (ref 6.5–8.1)

## 2020-07-31 LAB — URINALYSIS, COMPLETE (UACMP) WITH MICROSCOPIC
Bilirubin Urine: NEGATIVE
Glucose, UA: NEGATIVE mg/dL
Hgb urine dipstick: NEGATIVE
Ketones, ur: NEGATIVE mg/dL
Leukocytes,Ua: NEGATIVE
Nitrite: NEGATIVE
Protein, ur: NEGATIVE mg/dL
Specific Gravity, Urine: 1.008 (ref 1.005–1.030)
pH: 5 (ref 5.0–8.0)

## 2020-07-31 LAB — RESP PANEL BY RT-PCR (FLU A&B, COVID) ARPGX2
Influenza A by PCR: NEGATIVE
Influenza B by PCR: NEGATIVE
SARS Coronavirus 2 by RT PCR: NEGATIVE

## 2020-07-31 LAB — TROPONIN I (HIGH SENSITIVITY): Troponin I (High Sensitivity): 13 ng/L (ref <18)

## 2020-07-31 LAB — CBG MONITORING, ED: Glucose-Capillary: 164 mg/dL — ABNORMAL HIGH (ref 70–99)

## 2020-07-31 MED ORDER — LACTATED RINGERS IV BOLUS
1000.0000 mL | Freq: Once | INTRAVENOUS | Status: AC
  Administered 2020-07-31: 1000 mL via INTRAVENOUS

## 2020-07-31 MED ORDER — INSULIN ASPART 100 UNIT/ML IJ SOLN
0.0000 [IU] | INTRAMUSCULAR | Status: DC
  Administered 2020-07-31: 3 [IU] via SUBCUTANEOUS
  Filled 2020-07-31: qty 1

## 2020-07-31 MED ORDER — ACETAMINOPHEN 500 MG PO TABS
1000.0000 mg | ORAL_TABLET | Freq: Once | ORAL | Status: AC
  Administered 2020-07-31: 1000 mg via ORAL
  Filled 2020-07-31: qty 2

## 2020-07-31 NOTE — ED Notes (Signed)
Patient returned from MRI.

## 2020-07-31 NOTE — ED Notes (Signed)
Patient transported to CT 

## 2020-07-31 NOTE — ED Triage Notes (Signed)
Patient BIBA from home. Patient reports blurred vision for 6 days. States she saw PCP who believed that vision from diabetes. Patient reports hx of orthostatic hypotension. Patient reports feeling headache, dizziness and weakness today. Patient reports hx of TIA. Patient reports she was nauseated this morning. Given 4mg  zofran in route to ER. Nausea resolved. Patient A&OX3.

## 2020-07-31 NOTE — ED Notes (Signed)
MD at bedside. 

## 2020-07-31 NOTE — ED Notes (Signed)
Patient to MRI at this time.

## 2020-07-31 NOTE — ED Notes (Signed)
MRI screening in progress 

## 2020-07-31 NOTE — ED Notes (Signed)
Patient to and from toilet with walker, steady gait, states she feels weak

## 2020-07-31 NOTE — ED Provider Notes (Signed)
[facility] Emergency Department Provider Note  ____________________________________________   Event Date/Time   First MD Initiated Contact with Patient 07/31/20 1129     (approximate)  I have reviewed the triage vital signs and the nursing notes.   HISTORY  Chief Complaint Blurred Vision, Nausea (Weakness//), and Dizziness   HPI Laura Hatfield is a 80 y.o. female with a past no history of CAD, DM, HDL, HTN, CKD, and TIA who presents EMS for assessment of some generalized weakness associate with some nausea, headache and double vision.  Patient states she has had double vision a little blurry vision for about a week and was seen by her eye doctor who told her to cover 1 eye alternating as she only has double vision when both eyes are open.  She states this been helping but feels that today she got weaker and had a headache today.  She denies any chest pain, cough, shortness of breath, abdominal pain, back pain, urinary symptoms but endorses chronic diarrhea.  She denies any rash or extremity pain.  No recent falls or injuries.  Denies any vertigo.  No prior similar episodes.  No clearly fitting aggravating factors.         Past Medical History:  Diagnosis Date  . CAD (coronary artery disease)   . Diabetes mellitus without complication (HCC)   . Hyperlipidemia   . Thyroid nodule     Patient Active Problem List   Diagnosis Date Noted  . Hypertensive urgency 07/26/2017  . Aphasia 05/01/2017  . HLD (hyperlipidemia) 05/01/2017  . Diabetes (HCC) 05/01/2017  . CAD (coronary artery disease) 05/01/2017  . CKD (chronic kidney disease), stage III (HCC) 05/01/2017  . TIA (transient ischemic attack) 03/21/2016    Past Surgical History:  Procedure Laterality Date  . ANKLE SURGERY    . CHOLECYSTECTOMY    . EYE SURGERY    . HUMERUS SURGERY      Prior to Admission medications   Medication Sig Start Date End Date Taking? Authorizing Provider   acetaminophen (TYLENOL) 500 MG tablet Take by mouth.    [provider]  cholecalciferol (VITAMIN D) 1000 units tablet Take 1,000 Units by mouth at bedtime.     [provider]  clopidogrel (PLAVIX) 75 MG tablet Take 75 mg by mouth at bedtime.     [provider]  insulin NPH-regular Human (NOVOLIN 70/30) (70-30) 100 UNIT/ML injection Inject 25-30 Units into the skin See admin instructions. 30 units every morning and 25 units every evening    [provider]  ipratropium (ATROVENT) 0.03 % nasal spray Place into the nose. 01/14/18 03/06/20  [provider]  nitrofurantoin, macrocrystal-monohydrate, (MACROBID) 100 MG capsule Take 1 capsule (100 mg total) by mouth 2 (two) times daily. 03/06/20   Becky Augusta, NP  nitroGLYCERIN (NITROSTAT) 0.4 MG SL tablet Place 1 tablet under the tongue every 5 (five) minutes as needed for chest pain. 03/30/14   [provider]  phenazopyridine (PYRIDIUM) 200 MG tablet Take 1 tablet (200 mg total) by mouth 3 (three) times daily. 03/06/20   Becky Augusta, NP  REPATHA SURECLICK 140 MG/ML SOAJ  10/25/18   [provider]  escitalopram (LEXAPRO) 5 MG tablet Take 5 mg by mouth daily. 12/15/17 12/14/19  [provider]  furosemide (LASIX) 40 MG tablet Take 60 mg by mouth daily as needed for fluid.  01/04/15 12/14/19  [provider]  gabapentin (NEURONTIN) 100 MG capsule Take 100 mg by mouth at bedtime  as needed (for pain).   12/14/19  [provider]  metoprolol succinate (TOPROL-XL) 100 MG 24 hr tablet Take 150 mg by mouth at bedtime.   12/14/19  [provider]  rosuvastatin (CRESTOR) 40 MG tablet Take 20 mg by mouth at bedtime.   01/24/19  [provider]    Allergies Atorvastatin, Crestor [rosuvastatin], Lipitor [atorvastatin calcium], Lisinopril, Losartan, and Penicillins  Family History  Problem Relation Age of Onset  . Hypertension Mother   . Diabetes Mother    . Hypertension Father   . Stroke Father     Social History Social History   Tobacco Use  . Smoking status: Never Smoker  . Smokeless tobacco: Never Used  Vaping Use  . Vaping Use: Never used  Substance Use Topics  . Alcohol use: No  . Drug use: No    Review of Systems  Review of Systems  Constitutional: Negative for chills and fever.  HENT: Negative for sore throat.   Eyes: Positive for blurred vision and double vision. Negative for pain.  Respiratory: Negative for cough and stridor.   Cardiovascular: Negative for chest pain.  Gastrointestinal: Positive for nausea. Negative for vomiting.  Skin: Negative for rash.  Neurological: Positive for weakness. Negative for seizures, loss of consciousness and headaches.  Psychiatric/Behavioral: Negative for suicidal ideas.  All other systems reviewed and are negative.     ____________________________________________   PHYSICAL EXAM:  VITAL SIGNS: ED Triage Vitals  Enc Vitals Group     BP      Pulse      Resp      Temp      Temp src      SpO2      Weight      Height      Head Circumference      Peak Flow      Pain Score      Pain Loc      Pain Edu?      Excl. in GC?    Vitals:   07/31/20 1300 07/31/20 1445  BP: (!) 168/96 (!) 141/96  Pulse: 91 87  Resp: 13 16  Temp:    SpO2: 95% 96%   Physical Exam Vitals and nursing note reviewed.  Constitutional:      General: She is not in acute distress.    Appearance: She is well-developed. She is obese.  HENT:     Head: Normocephalic and atraumatic.     Right Ear: External ear normal.     Left Ear: External ear normal.     Nose: Nose normal.  Eyes:     Conjunctiva/sclera: Conjunctivae normal.  Cardiovascular:     Rate and Rhythm: Normal rate and regular rhythm.     Heart sounds: No murmur heard.   Pulmonary:     Effort: Pulmonary effort is normal. No respiratory distress.     Breath sounds: Normal breath sounds.  Abdominal:     Palpations: Abdomen is  soft.     Tenderness: There is no abdominal tenderness.  Musculoskeletal:     Cervical back: Neck supple. No rigidity.  Skin:    General: Skin is warm and dry.  Neurological:     Mental Status: She is alert and oriented to person, place, and time.  Psychiatric:        Mood and Affect: Mood normal.     Strength on all extremities.  No pronator.  No finger dysmetria.  Sensation intact light touch throughout all extremities.  Visual acuity is 20/25 bilaterally corrected.  There is very subtle palsy on lateral abduction of the left eye cranial nerves II through XII otherwise grossly intact.  ____________________________________________   LABS (all labs ordered are listed, but only abnormal results are displayed)  Labs Reviewed  URINALYSIS, COMPLETE (UACMP) WITH MICROSCOPIC - Abnormal; Notable for the following components:      Result Value   Color, Urine YELLOW (*)    APPearance HAZY (*)    Bacteria, UA RARE (*)    All other components within normal limits  CBC WITH DIFFERENTIAL/PLATELET - Abnormal; Notable for the following components:   RBC 5.18 (*)    Lymphs Abs 4.2 (*)    All other components within normal limits  COMPREHENSIVE METABOLIC PANEL - Abnormal; Notable for the following components:   CO2 19 (*)    Glucose, Bld 198 (*)    BUN 29 (*)    Creatinine, Ser 1.93 (*)    GFR, Estimated 26 (*)    Anion gap 16 (*)    All other components within normal limits  CBG MONITORING, ED - Abnormal; Notable for the following components:   Glucose-Capillary 164 (*)    All other components within normal limits  RESP PANEL BY RT-PCR (FLU A&B, COVID) ARPGX2  HEMOGLOBIN A1C  TROPONIN I (HIGH SENSITIVITY)   ____________________________________________  EKG  Sinus rhythm with ventricular rate of 76, normal axis, unremarkable intervals without clearance of acute ischemia or significant underlying arrhythmia. ____________________________________________  RADIOLOGY  ED MD  interpretation: CT head is unremarkable for clear acute cranial process.  MRI brain and MRI brain obtained shows no evidence of CVA hemorrhage or mass.  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  No significant vessel occlusion or stenosis.  Official radiology report(s): CT Head Wo Contrast  Result Date: 07/31/2020 CLINICAL DATA:  Blurred vision EXAM: CT HEAD WITHOUT CONTRAST TECHNIQUE: Contiguous axial images were obtained from the base of the skull through the vertex without intravenous contrast. COMPARISON:  09/06/2019 FINDINGS: Brain: No acute intracranial abnormality. Specifically, no hemorrhage, hydrocephalus, mass lesion, acute infarction, or significant intracranial injury. There is atrophy and chronic small vessel disease changes. Vascular: No hyperdense vessel or unexpected calcification. Skull: No acute calvarial abnormality. Sinuses/Orbits: No acute finding Other: None IMPRESSION: Atrophy, chronic microvascular disease. No acute intracranial abnormality. Electronically Signed   By: Charlett NoseKevin  Dover M.D.   On: 07/31/2020 12:08   MR ANGIO HEAD WO CONTRAST  Result Date: 07/31/2020 CLINICAL DATA:  Blurry vision EXAM: MRI HEAD WITHOUT CONTRAST MRA HEAD WITHOUT CONTRAST TECHNIQUE: Multiplanar, multi-echo pulse sequences of the brain and surrounding structures were acquired without intravenous contrast. Angiographic images of the Circle of Willis were acquired using MRA technique without intravenous contrast. COMPARISON: No pertinent prior exam. COMPARISON:  2019 FINDINGS: MRI HEAD Brain: There is no acute infarction or intracranial hemorrhage. There is no intracranial mass, mass effect, or edema. There is no hydrocephalus or extra-axial fluid collection. Prominence of the ventricles and sulci reflects similar generalized parenchymal volume loss. Confluent areas of T2 hyperintensity in the supratentorial white matter are nonspecific  but probably reflect similar advanced chronic microvascular ischemic changes. Small chronic left cerebellar infarcts. Vascular: Major vessel flow voids at the skull base are preserved. Skull and upper cervical spine: Normal marrow signal is preserved. Sinuses/Orbits: Minor mucosal thickening. Bilateral lens replacements. Other: No substantial change in subcentimeter mass of the pituitary infundibulum. Mastoid air cells are clear. MRA  HEAD Intracranial internal carotid arteries are patent. Middle and anterior cerebral arteries are patent. Intracranial vertebral arteries, basilar artery, posterior cerebral arteries are patent. Right posterior communicating artery is identified with fetal origin of the right PCA. There is no significant stenosis or aneurysm. IMPRESSION: No evidence of recent infarction, hemorrhage, or mass. Similar parenchymal volume loss and advanced chronic microvascular ischemic changes. 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 No proximal intracranial vessel occlusion or significant stenosis. Electronically Signed   By: Guadlupe Spanish M.D.   On: 07/31/2020 15:00   MR BRAIN WO CONTRAST  Result Date: 07/31/2020 CLINICAL DATA:  Blurry vision EXAM: MRI HEAD WITHOUT CONTRAST MRA HEAD WITHOUT CONTRAST TECHNIQUE: Multiplanar, multi-echo pulse sequences of the brain and surrounding structures were acquired without intravenous contrast. Angiographic images of the Circle of Willis were acquired using MRA technique without intravenous contrast. COMPARISON: No pertinent prior exam. COMPARISON:  2019 FINDINGS: MRI HEAD Brain: There is no acute infarction or intracranial hemorrhage. There is no intracranial mass, mass effect, or edema. There is no hydrocephalus or extra-axial fluid collection. Prominence of the ventricles and sulci reflects similar generalized parenchymal volume loss. Confluent areas of T2 hyperintensity in  the supratentorial white matter are nonspecific but probably reflect similar advanced chronic microvascular ischemic changes. Small chronic left cerebellar infarcts. Vascular: Major vessel flow voids at the skull base are preserved. Skull and upper cervical spine: Normal marrow signal is preserved. Sinuses/Orbits: Minor mucosal thickening. Bilateral lens replacements. Other: No substantial change in subcentimeter mass of the pituitary infundibulum. Mastoid air cells are clear. MRA HEAD Intracranial internal carotid arteries are patent. Middle and anterior cerebral arteries are patent. Intracranial vertebral arteries, basilar artery, posterior cerebral arteries are patent. Right posterior communicating artery is identified with fetal origin of the right PCA. There is no significant stenosis or aneurysm. IMPRESSION: No evidence of recent infarction, hemorrhage, or mass. Similar parenchymal volume loss and advanced chronic microvascular ischemic changes. 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 No proximal intracranial vessel occlusion or significant stenosis. Electronically Signed   By: Guadlupe Spanish M.D.   On: 07/31/2020 15:00    ____________________________________________   PROCEDURES  Procedure(s) performed (including Critical Care):  .1-3 Lead EKG Interpretation Performed by: Gilles Chiquito, MD Authorized by: Gilles Chiquito, MD     Interpretation: normal     ECG rate assessment: normal     Rhythm: sinus rhythm     Ectopy: none     Conduction: normal       ____________________________________________   INITIAL IMPRESSION / ASSESSMENT AND PLAN / ED COURSE      Patient presents with above-stated history exam for assessment of headache associate with generalized weakness in the setting approximately 6 days of diplopia evaluated by her eye doctor earlier this week for the symptoms advised to cover 1  eye alternating with concerns of this may be related to her diabetes.   On arrival she is afebrile and hemodynamically stable.  She is nonfocal neuro exam exception of very subtle palsy in the left eye on lateral abduction.  I suspect patient's diplopia is likely related to her left-sided cranial nerve III palsy and her headache likely also associated with this that there is no evidence of intracranial hemorrhage on CT or evidence of deep space infection of the neck or history of trauma will obtain MR brain as well as MRA to rule  out possible subtle CVA versus aneurysm.  Unclear if her overall weakness is related to this although we will also check basic labs and UA.  EKG with some nonspecific findings but given nonelevated troponin I have low suspicion for ACS.  UA not suggestive of cystitis.  CBC with no leukocytosis or acute anemia.  CMP remarkable for hyperglycemia with some mild dehydration with a glucose of 198 and a bicarb of 19 and anion gap of 16.  Patient's creatinine was previously 1.98 approximately 11 months ago.  No other significant electrode or metabolic derangements.  We will hydrate with IV fluids give patient dose of sliding scale insulin.  MRI brain and MRI brain obtained shows no evidence of CVA hemorrhage or mass.  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  No significant vessel occlusion or stenosis.  I discussed this finding of the pituitary infundibulum which patient states she is very aware of any chronic.  Given she states that she already knows about this does seem less likely this is related to diplopia today.  Suspect this is related to her poorly controlled diabetes.  She was given some fluids and advised her to follow-up with ophthalmology and PCP.  Further evaluation of her diplopia and better glucose control.  She is amenable with plan.  She is feeling better.  Discharged stable condition.  Strict  return precautions advised and discussed.     ____________________________________________   FINAL CLINICAL IMPRESSION(S) / ED DIAGNOSES  Final diagnoses:  Hyperglycemia  Nonintractable headache, unspecified chronicity pattern, unspecified headache type  Diplopia    Medications  insulin aspart (novoLOG) injection 0-15 Units (3 Units Subcutaneous Given 07/31/20 1321)  acetaminophen (TYLENOL) tablet 1,000 mg (1,000 mg Oral Given 07/31/20 1234)  lactated ringers bolus 1,000 mL (0 mLs Intravenous Stopped 07/31/20 1319)     ED Discharge Orders    None       Note:  This document was prepared using Dragon voice recognition software and may include unintentional dictation errors.   Gilles Chiquito, MD 07/31/20 [id number]

## 2020-08-01 LAB — HEMOGLOBIN A1C
Hgb A1c MFr Bld: 8.9 % — ABNORMAL HIGH (ref 4.8–5.6)
Mean Plasma Glucose: 209 mg/dL

## 2021-03-21 ENCOUNTER — Emergency Department: Payer: Medicare HMO

## 2021-03-21 ENCOUNTER — Other Ambulatory Visit: Payer: Self-pay

## 2021-03-21 ENCOUNTER — Emergency Department
Admission: EM | Admit: 2021-03-21 | Discharge: 2021-03-21 | Disposition: A | Discharge status: Home / Self Care | Payer: Medicare HMO | Attending: Emergency Medicine | Admitting: Emergency Medicine

## 2021-03-21 DIAGNOSIS — R531 Weakness: Secondary | ICD-10-CM | POA: Insufficient documentation

## 2021-03-21 DIAGNOSIS — E860 Dehydration: Secondary | ICD-10-CM | POA: Insufficient documentation

## 2021-03-21 DIAGNOSIS — R42 Dizziness and giddiness: Secondary | ICD-10-CM | POA: Diagnosis present

## 2021-03-21 DIAGNOSIS — R918 Other nonspecific abnormal finding of lung field: Secondary | ICD-10-CM | POA: Insufficient documentation

## 2021-03-21 DIAGNOSIS — R55 Syncope and collapse: Secondary | ICD-10-CM | POA: Diagnosis not present

## 2021-03-21 LAB — COMPREHENSIVE METABOLIC PANEL
ALT: 18 U/L (ref 0–44)
AST: 23 U/L (ref 15–41)
Albumin: 3.6 g/dL (ref 3.5–5.0)
Alkaline Phosphatase: 89 U/L (ref 38–126)
Anion gap: 11 (ref 5–15)
BUN: 24 mg/dL — ABNORMAL HIGH (ref 8–23)
CO2: 23 mmol/L (ref 22–32)
Calcium: 9.4 mg/dL (ref 8.9–10.3)
Chloride: 105 mmol/L (ref 98–111)
Creatinine, Ser: 1.7 mg/dL — ABNORMAL HIGH (ref 0.44–1.00)
GFR, Estimated: 30 mL/min — ABNORMAL LOW (ref >60)
Glucose, Bld: 222 mg/dL — ABNORMAL HIGH (ref 70–99)
Potassium: 4.3 mmol/L (ref 3.5–5.1)
Sodium: 139 mmol/L (ref 135–145)
Total Bilirubin: 0.6 mg/dL (ref 0.3–1.2)
Total Protein: 6.9 g/dL (ref 6.5–8.1)

## 2021-03-21 LAB — CBC
HCT: 46 % (ref 36.0–46.0)
Hemoglobin: 14.5 g/dL (ref 12.0–15.0)
MCH: 25.9 pg — ABNORMAL LOW (ref 26.0–34.0)
MCHC: 31.5 g/dL (ref 30.0–36.0)
MCV: 82.3 fL (ref 80.0–100.0)
Platelets: 242 10*3/uL (ref 150–400)
RBC: 5.59 MIL/uL — ABNORMAL HIGH (ref 3.87–5.11)
RDW: 15 % (ref 11.5–15.5)
WBC: 7.8 10*3/uL (ref 4.0–10.5)
nRBC: 0 % (ref 0.0–0.2)

## 2021-03-21 LAB — TROPONIN I (HIGH SENSITIVITY)
Troponin I (High Sensitivity): 13 ng/L (ref <18)
Troponin I (High Sensitivity): 12 ng/L (ref <18)

## 2021-03-21 MED ORDER — IOHEXOL 300 MG/ML  SOLN
60.0000 mL | Freq: Once | INTRAMUSCULAR | Status: AC | PRN
  Administered 2021-03-21: 60 mL via INTRAVENOUS

## 2021-03-21 MED ORDER — SODIUM CHLORIDE 0.9 % IV SOLN
1000.0000 mL | Freq: Once | INTRAVENOUS | Status: AC
Start: 2021-03-21 — End: 2021-03-21
  Administered 2021-03-21: 1000 mL via INTRAVENOUS

## 2021-03-21 NOTE — ED Provider Notes (Signed)
[facility] Provider Note    Event Date/Time   First MD Initiated Contact with Patient 03/21/21 1028     (approximate)   History   Near Syncope   HPI  Laura Hatfield is a 81 y.o. female who presents with dizziness.  Patient reports she has been feeling weak over the last several days and has felt like she might fall several times.  She feels better when she lies down.  No chest pain or palpitations.  No nausea or vomiting.  No vertigo symptoms.  She reports this is happened to her in the past, she does not think that she is dehydrated.  No cough or significant shortness of breath     Physical Exam   Triage Vital Signs: ED Triage Vitals [03/21/21 1043]  Enc Vitals Group     BP (!) 146/99     Pulse Rate 90     Resp 18     Temp 98.4 F (36.9 C)     Temp Source Oral     SpO2 99 %     Weight      Height      Head Circumference      Peak Flow      Pain Score 0     Pain Loc      Pain Edu?      Excl. in GC?     Most recent vital signs: Vitals:   03/21/21 1215 03/21/21 1218  BP:  (!) 146/79  Pulse: 94 94  Resp: 12 17  Temp:    SpO2: 96% 96%     General: Awake, no distress.  CV:  Good peripheral perfusion.  Regular rate and rhythm Resp:  Normal effort.  Clear to auscultation bilaterally Abd:  No distention.  Other:     ED Results / Procedures / Treatments   Labs (all labs ordered are listed, but only abnormal results are displayed) Labs Reviewed  CBC - Abnormal; Notable for the following components:      Result Value   RBC 5.59 (*)    MCH 25.9 (*)    All other components within normal limits  COMPREHENSIVE METABOLIC PANEL - Abnormal; Notable for the following components:   Glucose, Bld 222 (*)    BUN 24 (*)    Creatinine, Ser 1.70 (*)    GFR, Estimated 30 (*)    All other components within normal limits  URINALYSIS, ROUTINE W REFLEX MICROSCOPIC  TROPONIN I (HIGH SENSITIVITY)  TROPONIN I (HIGH SENSITIVITY)      EKG  ED ECG REPORT I, Jene Every, the attending physician, personally viewed and interpreted this ECG.  Date: 03/21/2021  Rhythm: normal sinus rhythm QRS Axis: normal Intervals: normal ST/T Wave abnormalities: normal Narrative Interpretation: no evidence of acute ischemia    RADIOLOGY Chest x-ray reviewed by me, no acute abnormality, radiology notes possible pneumonia, will send for CT chest  CT chest reviewed by me, no acute normality, confirmed by radiology    PROCEDURES:  Critical Care performed:   .1-3 Lead EKG Interpretation Performed by: Jene Every, MD Authorized by: Jene Every, MD     Interpretation: normal     ECG rate assessment: normal     Rhythm: sinus rhythm     Ectopy: none     Conduction: normal     MEDICATIONS ORDERED IN ED: Medications  0.9 %  sodium chloride infusion (0 mLs Intravenous Stopped 03/21/21 1230)  iohexol (OMNIPAQUE) 300 MG/ML solution 60 mL (60  mLs Intravenous Contrast Given 03/21/21 1154)     IMPRESSION / MDM / ASSESSMENT AND PLAN / ED COURSE  I reviewed the triage vital signs and the nursing notes.    Patient presents with weakness as described above.  Overall well-appearing and in no acute distress, vital signs are reassuring.  Lab work notable for elevated BUN/creatinine consistent with possible dehydration, this can certainly cause some of her weakness.  Treat with IV fluids.  Otherwise CBC is reassuring.  Chest x-ray with possible pneumonia, sent for CT which was unremarkable.  Orthostatics are normal, patient is feeling improved, did offer admission however the patient reports that when she came to the hospital her May have left the house so she needs to be discharged to care for it.        FINAL CLINICAL IMPRESSION(S) / ED DIAGNOSES   Final diagnoses:  Near syncope  Weakness  Dehydration     Rx / DC Orders   ED Discharge Orders     None        Note:  This document was prepared using  Dragon voice recognition software and may include unintentional dictation errors.   Jene Every, MD 03/21/21 1434

## 2021-03-21 NOTE — ED Triage Notes (Addendum)
Pt comes into the ED via EMS from home with c/o each time she attempted to stand from sitting she fell x3 today, states she had near syncope with each fall Intermittent sx over the past 2-3 days A/ox4 125/63 sitting, 72/37 standing #20gRAC #20gRFA NS infused PTA 153/62 after fluids HR 70-90's RR16 86-90%RA,  [id number]

## 2021-03-21 NOTE — ED Notes (Signed)
T to ED for near sycnope, twice today and twice yesterday. Has 2 20g IV line sin place from EMS and has received IVF. Pt roomed. Alert, oriented. States did not fall or hit head, but she was walking and suddenly felt weak "from my head down". She lowered herself to floor then pulled self up, started walking to bedroom, and same thing happened again (this morning).   Denies vomiting, vision changes. Denies chest pain.

## 2021-03-21 NOTE — ED Notes (Signed)
ED provider at bedside.

## 2021-03-21 NOTE — ED Notes (Signed)
Provided applesauce to pt. Pt is alert but confused.

## 2021-04-18 ENCOUNTER — Encounter (HOSPITAL_COMMUNITY): Payer: Self-pay | Admitting: Radiology

## 2022-08-14 ENCOUNTER — Emergency Department
Admission: EM | Admit: 2022-08-14 | Discharge: 2022-08-14 | Disposition: A | Discharge status: Home / Self Care | Payer: Medicare HMO | Attending: Emergency Medicine | Admitting: Emergency Medicine

## 2022-08-14 ENCOUNTER — Emergency Department: Payer: Medicare HMO

## 2022-08-14 ENCOUNTER — Other Ambulatory Visit: Payer: Self-pay

## 2022-08-14 DIAGNOSIS — Z8673 Personal history of transient ischemic attack (TIA), and cerebral infarction without residual deficits: Secondary | ICD-10-CM | POA: Diagnosis not present

## 2022-08-14 DIAGNOSIS — I10 Essential (primary) hypertension: Secondary | ICD-10-CM | POA: Insufficient documentation

## 2022-08-14 DIAGNOSIS — Z794 Long term (current) use of insulin: Secondary | ICD-10-CM | POA: Diagnosis not present

## 2022-08-14 DIAGNOSIS — R309 Painful micturition, unspecified: Secondary | ICD-10-CM | POA: Diagnosis present

## 2022-08-14 DIAGNOSIS — Z79899 Other long term (current) drug therapy: Secondary | ICD-10-CM | POA: Insufficient documentation

## 2022-08-14 DIAGNOSIS — N3001 Acute cystitis with hematuria: Secondary | ICD-10-CM | POA: Insufficient documentation

## 2022-08-14 DIAGNOSIS — I2510 Atherosclerotic heart disease of native coronary artery without angina pectoris: Secondary | ICD-10-CM | POA: Insufficient documentation

## 2022-08-14 DIAGNOSIS — E119 Type 2 diabetes mellitus without complications: Secondary | ICD-10-CM | POA: Diagnosis not present

## 2022-08-14 LAB — COMPREHENSIVE METABOLIC PANEL
ALT: 13 U/L (ref 0–44)
AST: 17 U/L (ref 15–41)
Albumin: 3.9 g/dL (ref 3.5–5.0)
Alkaline Phosphatase: 74 U/L (ref 38–126)
Anion gap: 11 (ref 5–15)
BUN: 26 mg/dL — ABNORMAL HIGH (ref 8–23)
CO2: 23 mmol/L (ref 22–32)
Calcium: 9 mg/dL (ref 8.9–10.3)
Chloride: 106 mmol/L (ref 98–111)
Creatinine, Ser: 1.62 mg/dL — ABNORMAL HIGH (ref 0.44–1.00)
GFR, Estimated: 31 mL/min — ABNORMAL LOW (ref >60)
Glucose, Bld: 134 mg/dL — ABNORMAL HIGH (ref 70–99)
Potassium: 3.7 mmol/L (ref 3.5–5.1)
Sodium: 140 mmol/L (ref 135–145)
Total Bilirubin: 0.7 mg/dL (ref 0.3–1.2)
Total Protein: 6.9 g/dL (ref 6.5–8.1)

## 2022-08-14 LAB — URINALYSIS, W/ REFLEX TO CULTURE (INFECTION SUSPECTED)
Bilirubin Urine: NEGATIVE
Glucose, UA: NEGATIVE mg/dL
Ketones, ur: NEGATIVE mg/dL
Nitrite: POSITIVE — AB
Protein, ur: 100 mg/dL — AB
Specific Gravity, Urine: 1.015 (ref 1.005–1.030)
WBC, UA: >50 WBC/hpf (ref 0–5)
pH: 5 (ref 5.0–8.0)

## 2022-08-14 LAB — CBC
HCT: 41.2 % (ref 36.0–46.0)
Hemoglobin: 12.9 g/dL (ref 12.0–15.0)
MCH: 26.9 pg (ref 26.0–34.0)
MCHC: 31.3 g/dL (ref 30.0–36.0)
MCV: 86 fL (ref 80.0–100.0)
Platelets: 273 10*3/uL (ref 150–400)
RBC: 4.79 MIL/uL (ref 3.87–5.11)
RDW: 15.3 % (ref 11.5–15.5)
WBC: 8.6 10*3/uL (ref 4.0–10.5)
nRBC: 0 % (ref 0.0–0.2)

## 2022-08-14 LAB — LIPASE, BLOOD: Lipase: 31 U/L (ref 11–51)

## 2022-08-14 MED ORDER — SODIUM CHLORIDE 0.9 % IV SOLN
1.0000 g | Freq: Once | INTRAVENOUS | Status: AC
  Administered 2022-08-14: 1 g via INTRAVENOUS
  Filled 2022-08-14: qty 10

## 2022-08-14 MED ORDER — ONDANSETRON HCL 4 MG/2ML IJ SOLN
4.0000 mg | Freq: Once | INTRAMUSCULAR | Status: AC
  Administered 2022-08-14: 4 mg via INTRAVENOUS
  Filled 2022-08-14: qty 2

## 2022-08-14 MED ORDER — CEFDINIR 300 MG PO CAPS: 300.0000 mg | ORAL_CAPSULE | Freq: Two times a day (BID) | ORAL | 0 refills | Status: AC

## 2022-08-14 MED ORDER — SODIUM CHLORIDE 0.9 % IV BOLUS
500.0000 mL | Freq: Once | INTRAVENOUS | Status: AC
  Administered 2022-08-14: 500 mL via INTRAVENOUS

## 2022-08-14 MED ORDER — PHENAZOPYRIDINE HCL 100 MG PO TABS: 100.0000 mg | ORAL_TABLET | Freq: Three times a day (TID) | ORAL | 0 refills | Status: AC | PRN

## 2022-08-14 MED ORDER — ACETAMINOPHEN 500 MG PO TABS
1000.0000 mg | ORAL_TABLET | Freq: Once | ORAL | Status: AC
  Administered 2022-08-14: 1000 mg via ORAL
  Filled 2022-08-14: qty 2

## 2022-08-14 MED ORDER — ONDANSETRON 4 MG PO TBDP: 4.0000 mg | ORAL_TABLET | Freq: Three times a day (TID) | ORAL | 0 refills | Status: DC | PRN

## 2022-08-14 NOTE — ED Provider Notes (Signed)
[facility] Provider Note    Event Date/Time   First MD Initiated Contact with Patient 08/14/22 0900     (approximate)   History   painful urination   HPI  Laura Hatfield is a 82 y.o. female past medical history significant for obesity, hypertension, CAD, hyperlipidemia, thyroid disorder, diabetes on insulin, history of TIA, chronic diarrhea, who presents to the emergency department with painful urination.  1 week of painful urination that has been progressively worsening.  Was evaluated at her primary care physician office last week and had a UA done that was normal.  Told to follow-up with gynecology.  Endorses ongoing abdominal pain to the lower abdomen, feels like she is having ongoing pressure in her bladder and urinary incontinence.  Significant pain that worsened today.  Denies any falls or trauma.  No history of kidney stones.  Endorses nausea but episodes of vomiting.  No fever.  No blood in her stool.  Not on anticoagulation.     Physical Exam   Triage Vital Signs: ED Triage Vitals  Enc Vitals Group     BP      Pulse      Resp      Temp      Temp src      SpO2      Weight      Height      Head Circumference      Peak Flow      Pain Score      Pain Loc      Pain Edu?      Excl. in GC?     Most recent vital signs: Vitals:   08/14/22 0901 08/14/22 1030  BP: (!) 152/82 (!) 163/79  Pulse: 75 82  Resp: 16 16  Temp: 98.8 F (37.1 C)   SpO2: 100% 97%    Physical Exam Constitutional:      Appearance: She is well-developed.  HENT:     Head: Atraumatic.  Eyes:     Conjunctiva/sclera: Conjunctivae normal.  Cardiovascular:     Rate and Rhythm: Regular rhythm.  Pulmonary:     Effort: No respiratory distress.  Abdominal:     General: There is no distension.     Tenderness: There is abdominal tenderness (Suprapubic left lower quadrant). There is no right CVA tenderness or left CVA tenderness.  Musculoskeletal:        General:  Normal range of motion.     Cervical back: Normal range of motion.  Skin:    General: Skin is warm.     Capillary Refill: Capillary refill takes less than 2 seconds.  Neurological:     Mental Status: She is alert. Mental status is at baseline.     IMPRESSION / MDM / ASSESSMENT AND PLAN / ED COURSE  I reviewed the triage vital signs and the nursing notes.  Differential diagnosis including UTI, interstitial cystitis, diverticulitis, intra-abdominal abscess, kidney stones  EKG  I, Corena Herter, the attending physician, personally viewed and interpreted this ECG.   Rate: Normal  Rhythm: Normal sinus  Axis: Normal  Intervals: Normal  ST&T Change: None  No tachycardic or bradycardic dysrhythmias while on cardiac telemetry.  RADIOLOGY I independently reviewed imaging, my interpretation of imaging: CT abdomen and pelvis -no findings of an obstructing kidney stone.  No hydronephrosis.  LABS (all labs ordered are listed, but only abnormal results are displayed) Labs interpreted as -    Labs Reviewed  COMPREHENSIVE METABOLIC PANEL -  Abnormal; Notable for the following components:      Result Value   Glucose, Bld 134 (*)    BUN 26 (*)    Creatinine, Ser 1.62 (*)    GFR, Estimated 31 (*)    All other components within normal limits  URINALYSIS, W/ REFLEX TO CULTURE (INFECTION SUSPECTED) - Abnormal; Notable for the following components:   Color, Urine AMBER (*)    APPearance CLOUDY (*)    Hgb urine dipstick SMALL (*)    Protein, ur 100 (*)    Nitrite POSITIVE (*)    Leukocytes,Ua SMALL (*)    Bacteria, UA MANY (*)    All other components within normal limits  URINE CULTURE  CBC  LIPASE, BLOOD     MDM  Lab work consistent with urinary tract infection.  Urine culture added on.  On chart review patient does not had a history of resistant urinary tract infections in the past.  Creatinine is at her baseline.  No significant electrolyte abnormalities.  CT scan without  findings of kidney stone.  History of anaphylaxis to penicillin, on chart review patient had Rocephin for 3 days in the hospital in 2014.  Discussed with pharmacy and will do a trial of ceftriaxone.  On reevaluation no signs of an allergic reaction with ceftriaxone.  Will start the patient on cefdinir.  Given antiemetics and discussed close follow-up with primary care provider.     PROCEDURES:  Critical Care performed: No  Procedures  Patient's presentation is most consistent with acute presentation with potential threat to life or bodily function.   MEDICATIONS ORDERED IN ED: Medications  sodium chloride 0.9 % bolus 500 mL (0 mLs Intravenous Stopped 08/14/22 1009)  ondansetron (ZOFRAN) injection 4 mg (4 mg Intravenous Given 08/14/22 0918)  acetaminophen (TYLENOL) tablet 1,000 mg (1,000 mg Oral Given 08/14/22 0917)  cefTRIAXone (ROCEPHIN) 1 g in sodium chloride 0.9 % 100 mL IVPB (0 g Intravenous Stopped 08/14/22 1213)    FINAL CLINICAL IMPRESSION(S) / ED DIAGNOSES   Final diagnoses:  Acute cystitis with hematuria     Rx / DC Orders   ED Discharge Orders     None        Note:  This document was prepared using Dragon voice recognition software and may include unintentional dictation errors.   Corena Herter, MD 08/14/22 [phone]

## 2022-08-14 NOTE — ED Notes (Signed)
Pt verbalizes understanding of discharge instructions. Opportunity for questioning and answers were provided. Pt discharged from ED to home with nephew.

## 2022-08-14 NOTE — Discharge Instructions (Addendum)
You are seen in the emergency department and diagnosed with urinary tract infection.  You tolerated your first dose of IV antibiotics with ceftriaxone.  Start your next dose of oral antibiotics tomorrow morning.  This antibiotic can change your urine and orange color.  You are also given a prescription for Pyridium which you can take as needed for urinary tract symptoms.  Do not take for more than 2 days.  You are also given a prescription for nausea medication.  Follow-up closely with your primary care provider.  Stay hydrated and drink plenty of fluids.  Pain control:  Acetaminophen (tylenol) - You can take 2 extra strength tablets (1000 mg) every 6 hours as needed for pain/fever.  Make sure you eat food/drink water when taking these medications.  zofran (ondansetron) - nausea medication, take 1 tablet every 8 hours as needed for nausea/vomiting.

## 2022-08-14 NOTE — ED Notes (Signed)
Called nephew Alinda Money for a ride home. Reports he will be here soon to pick patient up.

## 2022-08-14 NOTE — ED Notes (Signed)
Patient transported to CT 

## 2022-08-14 NOTE — ED Triage Notes (Signed)
Pt to ED via [facility] from home for c/o painful urination described as pressure x1 wk. Pt states she saw her dr and had a urinalysis one week ago which was negative. Symptoms have not improved.  Hr 75 100% RA Bp 147/70 Temp 98.5 Cbg 134

## 2022-08-16 LAB — URINE CULTURE: Culture: >=100000 — AB

## 2023-06-04 ENCOUNTER — Observation Stay
Admission: EM | Admit: 2023-06-04 | Discharge: 2023-06-15 | Disposition: A | Discharge status: Skilled Nursing Facility | Attending: Hospitalist | Admitting: Hospitalist

## 2023-06-04 ENCOUNTER — Other Ambulatory Visit: Payer: Self-pay

## 2023-06-04 ENCOUNTER — Emergency Department

## 2023-06-04 DIAGNOSIS — Z79899 Other long term (current) drug therapy: Secondary | ICD-10-CM | POA: Insufficient documentation

## 2023-06-04 DIAGNOSIS — Z7982 Long term (current) use of aspirin: Secondary | ICD-10-CM | POA: Diagnosis not present

## 2023-06-04 DIAGNOSIS — E1122 Type 2 diabetes mellitus with diabetic chronic kidney disease: Secondary | ICD-10-CM | POA: Insufficient documentation

## 2023-06-04 DIAGNOSIS — I2510 Atherosclerotic heart disease of native coronary artery without angina pectoris: Secondary | ICD-10-CM | POA: Diagnosis not present

## 2023-06-04 DIAGNOSIS — A419 Sepsis, unspecified organism: Principal | ICD-10-CM

## 2023-06-04 DIAGNOSIS — N390 Urinary tract infection, site not specified: Secondary | ICD-10-CM | POA: Insufficient documentation

## 2023-06-04 DIAGNOSIS — Z794 Long term (current) use of insulin: Secondary | ICD-10-CM | POA: Diagnosis not present

## 2023-06-04 DIAGNOSIS — N1832 Chronic kidney disease, stage 3b: Secondary | ICD-10-CM | POA: Diagnosis not present

## 2023-06-04 DIAGNOSIS — Z1152 Encounter for screening for COVID-19: Secondary | ICD-10-CM | POA: Diagnosis not present

## 2023-06-04 DIAGNOSIS — S82892A Other fracture of left lower leg, initial encounter for closed fracture: Principal | ICD-10-CM | POA: Insufficient documentation

## 2023-06-04 DIAGNOSIS — M25572 Pain in left ankle and joints of left foot: Secondary | ICD-10-CM | POA: Diagnosis present

## 2023-06-04 DIAGNOSIS — W19XXXA Unspecified fall, initial encounter: Secondary | ICD-10-CM | POA: Insufficient documentation

## 2023-06-04 DIAGNOSIS — I129 Hypertensive chronic kidney disease with stage 1 through stage 4 chronic kidney disease, or unspecified chronic kidney disease: Secondary | ICD-10-CM | POA: Diagnosis not present

## 2023-06-04 DIAGNOSIS — E119 Type 2 diabetes mellitus without complications: Secondary | ICD-10-CM

## 2023-06-04 LAB — CBG MONITORING, ED
Glucose-Capillary: 144 mg/dL — ABNORMAL HIGH (ref 70–99)
Glucose-Capillary: 140 mg/dL — ABNORMAL HIGH (ref 70–99)
Glucose-Capillary: 247 mg/dL — ABNORMAL HIGH (ref 70–99)

## 2023-06-04 MED ORDER — VITAMIN D 25 MCG (1000 UNIT) PO TABS
1000.0000 [IU] | ORAL_TABLET | Freq: Every day | ORAL | Status: DC
  Administered 2023-06-04 – 2023-06-14 (×11): 1000 [IU] via ORAL
  Filled 2023-06-04 (×11): qty 1

## 2023-06-04 MED ORDER — ASPIRIN 81 MG PO TBEC
81.0000 mg | DELAYED_RELEASE_TABLET | Freq: Every day | ORAL | Status: DC
  Administered 2023-06-04 – 2023-06-15 (×12): 81 mg via ORAL
  Filled 2023-06-04 (×12): qty 1

## 2023-06-04 MED ORDER — INSULIN ASPART 100 UNIT/ML IJ SOLN
0.0000 [IU] | Freq: Three times a day (TID) | INTRAMUSCULAR | Status: DC
  Administered 2023-06-04: 1 [IU] via SUBCUTANEOUS
  Administered 2023-06-05: 2 [IU] via SUBCUTANEOUS
  Administered 2023-06-05: 3 [IU] via SUBCUTANEOUS
  Administered 2023-06-05: 2 [IU] via SUBCUTANEOUS
  Administered 2023-06-06: 1 [IU] via SUBCUTANEOUS
  Administered 2023-06-06: 2 [IU] via SUBCUTANEOUS
  Administered 2023-06-06: 3 [IU] via SUBCUTANEOUS
  Administered 2023-06-07 (×2): 2 [IU] via SUBCUTANEOUS
  Administered 2023-06-07: 1 [IU] via SUBCUTANEOUS
  Administered 2023-06-08: 3 [IU] via SUBCUTANEOUS
  Administered 2023-06-08 – 2023-06-09 (×5): 2 [IU] via SUBCUTANEOUS
  Administered 2023-06-10: 3 [IU] via SUBCUTANEOUS
  Administered 2023-06-10 – 2023-06-11 (×2): 2 [IU] via SUBCUTANEOUS
  Administered 2023-06-11: 1 [IU] via SUBCUTANEOUS
  Administered 2023-06-12: 2 [IU] via SUBCUTANEOUS
  Administered 2023-06-12: 1 [IU] via SUBCUTANEOUS
  Administered 2023-06-12 – 2023-06-13 (×2): 2 [IU] via SUBCUTANEOUS
  Administered 2023-06-13: 3 [IU] via SUBCUTANEOUS
  Administered 2023-06-14: 1 [IU] via SUBCUTANEOUS
  Administered 2023-06-14 (×2): 2 [IU] via SUBCUTANEOUS
  Administered 2023-06-15: 1 [IU] via SUBCUTANEOUS
  Administered 2023-06-15: 2 [IU] via SUBCUTANEOUS
  Filled 2023-06-04 (×28): qty 1

## 2023-06-04 MED ORDER — INSULIN ASPART 100 UNIT/ML IJ SOLN
0.0000 [IU] | Freq: Every day | INTRAMUSCULAR | Status: DC
  Administered 2023-06-04 – 2023-06-12 (×2): 2 [IU] via SUBCUTANEOUS
  Filled 2023-06-04 (×2): qty 1

## 2023-06-04 MED ORDER — OXYCODONE HCL 5 MG PO TABS
5.0000 mg | ORAL_TABLET | ORAL | Status: AC
  Administered 2023-06-04: 5 mg via ORAL
  Filled 2023-06-04: qty 1

## 2023-06-04 MED ORDER — INSULIN GLARGINE-YFGN 100 UNIT/ML ~~LOC~~ SOLN
0.1000 [IU]/kg | Freq: Every day | SUBCUTANEOUS | Status: DC
  Administered 2023-06-04 – 2023-06-10 (×7): 10 [IU] via SUBCUTANEOUS
  Filled 2023-06-04 (×8): qty 0.1

## 2023-06-04 MED ORDER — OXYCODONE-ACETAMINOPHEN 5-325 MG PO TABS
1.0000 | ORAL_TABLET | Freq: Three times a day (TID) | ORAL | Status: AC | PRN
  Administered 2023-06-05 – 2023-06-06 (×4): 1 via ORAL
  Filled 2023-06-04 (×4): qty 1

## 2023-06-04 NOTE — ED Provider Notes (Signed)
 [facility] Provider Note    Event Date/Time   First MD Initiated Contact with Patient 06/04/23 [phone]     (approximate)   History   Ankle Pain   HPI  Laura Hatfield is a 83 y.o. female history of coronary disease and diabetes  Patient was getting up this morning to let her cat out.  She reports that she stood up she is normally spouse use her walker but she decided it was a short distance.  She started to walk and in so doing she lost her balance and reports she fell.  She fell with her left ankle sort of twisted up underneath her.  She denies any headache no neck pain.  She reports she may have just lightly glanced her head on something while falling but has no headache takes no blood thinner.  She reports it does not feel injured at all.  No neck pain no numbness or weakness except she reports neuropathy in her feet.  She was unable to get up without severe pain in her left ankle.  She was only on the floor very briefly she had learned from physical therapy how to get herself up if she fell.  She reports pain that is moderate in her left ankle joint.  Did receive Tylenol prior  No preceding illness no recent illness.  No trouble breathing no chest pain denies injury to the arms, no injury to the right leg.  No pain in the left hip left knee reports the discomfort is all sort of right in the ankle not really in the foot so much and she still able to wiggle her toes normally   Vitals:   06/04/23 1400 06/04/23 1600  BP: (!) 158/71 (!) 164/69  Pulse: 85 88  Resp:  16  SpO2: 95% 96%        Physical Exam   Triage Vital Signs: ED Triage Vitals [06/04/23 0834]  Encounter Vitals Group     BP      Systolic BP Percentile      Diastolic BP Percentile      Pulse      Resp      Temp      Temp src      SpO2      Weight      Height      Head Circumference      Peak Flow      Pain Score 10     Pain Loc      Pain Education      Exclude from  Growth Chart     Most recent vital signs: Vitals:   06/04/23 1400 06/04/23 1600  BP: (!) 158/71 (!) 164/69  Pulse: 85 88  Resp:  16  SpO2: 95% 96%     General: Awake, no distress.  Head normocephalic atraumatic no contusions.  No scalp tenderness.  No midline cervical or thoracic tenderness. CV:  Good peripheral perfusion.  Clear tones. Resp:  Normal effort.  Normal clear lungs.  Even unlabored respiration Abd:  No distention.  Soft nontender nondistended Other:  Moves upper extremities well without deformity or injury.  No complaint or injury noted.  Right lower extremity good range of motion strong intact distal pulses warm well-perfused with normal wiggle and strength through all major joints.  Left lower extremity no pain or tenderness over the left hip joint, no deformity, no tenderness over the femur knee or proximal tib-fib.  She does report moderate  tenderness originating around the left ankle joint and question if there is E version or slight deformity at the left ankle.  There is no bleeding.  Left in EMS splinting as she reports this has helped control her pain and is radiolucent.  She has intact palpable dorsalis pedis and posterior tibial pulses in the left foot.  She wiggles her toes normally in the left foot.  She does report decreased sensation but it is equivalent over both feet primarily over the toes and midfoot which she reports is her typical neuropathy.  She is able to discriminate touch normally over the distal tib-fib areas bilaterally.   ED Results / Procedures / Treatments   Labs (all labs ordered are listed, but only abnormal results are displayed) Labs Reviewed  CBG MONITORING, ED - Abnormal; Notable for the following components:      Result Value   Glucose-Capillary 144 (*)    All other components within normal limits  CBG MONITORING, ED - Abnormal; Notable for the following components:   Glucose-Capillary 140 (*)    All other components within normal  limits  HEMOGLOBIN A1C     EKG     RADIOLOGY  CT Ankle Left Wo Contrast Result Date: 06/04/2023 CLINICAL DATA:  Left ankle pain and difficulty weight-bearing after a fall the evening of 06/03/2023. EXAM: CT OF THE LEFT ANKLE WITHOUT CONTRAST TECHNIQUE: Multidetector CT imaging of the left ankle was performed according to the standard protocol. Multiplanar CT image reconstructions were also generated. RADIATION DOSE REDUCTION: This exam was performed according to the departmental dose-optimization program which includes automated exposure control, adjustment of the mA and/or kV according to patient size and/or use of iterative reconstruction technique. COMPARISON:  Plain films left foot and lower leg 06/04/2023. FINDINGS: Bones/Joint/Cartilage The patient has a fracture of the posterior malleolus. The fracture fragment demonstrates mild superior displacement. A cortical bone fragment off the anterolateral corner of the distal tibia is also consistent with fracture. Finally, a thin cortical fragment off of the medial talus subjacent to the medial malleolus is also seen in consistent with fracture. No other fracture is identified. 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 Ligaments Suboptimally assessed by CT. Muscles and Tendons Tendons appeared intact. Visualized musculature demonstrates fatty atrophy. Soft tissues Soft tissue contusions are seen about the ankle. IMPRESSION: 1. Fracture of the posterior malleolus with mild superior displacement of the fracture fragment. 2. Small cortical fracture off the anterolateral corner of the distal tibia. 3. Small cortical fracture off the medial talus subjacent to the medial malleolus. 4. 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 5. 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 Electronically Signed   By: Drusilla Kanner M.D.   On: 06/04/2023 12:02   DG Foot Complete Left Result Date: 06/04/2023 CLINICAL DATA:  pain, possible deformity around the L ankle area. Fall. EXAM: LEFT FOOT - COMPLETE 3+ VIEW COMPARISON:  Foot series 07/05/2013.  Tibia and fibula series today. FINDINGS: Nondisplaced posterior malleolar fracture suspected. Widening of the anterior ankle mortise on the lateral view. No foot fracture seen. Degenerative changes in the midfoot and hindfoot. Pes planus deformity. Diffuse soft tissue swelling. Radiopaque foreign body noted within the plantar soft tissues overlying the calcaneus concerning for a needle fragment. IMPRESSION: Concern for nondisplaced posterior malleolar fracture. Widening of the anterior ankle mortise. Recommend for further evaluation with dedicated  ankle series. Small linear radiopaque foreign body in the plantar soft tissues overlying the calcaneus concerning for a needle fragment. Electronically Signed   By: Charlett Nose M.D.   On: 06/04/2023 11:09   DG Tibia/Fibula Left Result Date: 06/04/2023 CLINICAL DATA:  Pain, possible deformity around the left ankle. Is fall. EXAM: LEFT TIBIA AND FIBULA - 2 VIEW COMPARISON:  Foot series 07/05/2013 and today FINDINGS: Positioning limits study. There is an oblique fracture through the midshaft of the left fibula, mildly displaced. Fractures through the posterior malleolus in the distal tibia. Widening of the ankle mortise anteriorly. IMPRESSION: Oblique mildly displaced midshaft fibular fracture. Posterior malleolar fracture with widening of the anterior ankle mortise on the lateral view. Recommend dedicated ankle series. Electronically Signed   By: Charlett Nose M.D.   On: 06/04/2023 11:07      PROCEDURES:  Critical Care performed: No  Procedures   MEDICATIONS ORDERED IN ED: Medications  oxyCODONE-acetaminophen (PERCOCET/ROXICET) 5-325 MG per tablet 1 tablet (has no administration  in time range)  aspirin EC tablet 81 mg (81 mg Oral Given 06/04/23 1742)  cholecalciferol (VITAMIN D3) 25 MCG (1000 UNIT) tablet 1,000 Units (has no administration in time range)  insulin aspart (novoLOG) injection 0-9 Units (1 Units Subcutaneous Given 06/04/23 1750)  insulin aspart (novoLOG) injection 0-5 Units (has no administration in time range)  insulin glargine-yfgn (SEMGLEE) injection 10 Units (has no administration in time range)  oxyCODONE (Oxy IR/ROXICODONE) immediate release tablet 5 mg (5 mg Oral Given 06/04/23 0851)     IMPRESSION / MDM / ASSESSMENT AND PLAN / ED COURSE  I reviewed the triage vital signs and the nursing notes.                              Differential diagnosis includes, but is not limited to, injury suffered from a mechanical fall.  She reports that she got up was not using her walker lost her balance and fell.  She appears to have evidence of left ankle injury awaiting imaging.  No evidence of open injury to noted .  She is neurovascular intact with normal motor examination in the lower foot bilateral without deficit.  Will provide pain control for her moderate ongoing pain, imaging.  Not at high risk for acute intracranial hemorrhage normocephalic atraumatic no headache no neurologic deficits.  Patient's presentation is most consistent with acute complicated illness / injury requiring diagnostic workup.      Clinical Course as of 06/04/23 1944  Thu Jun 04, 2023  1342 Consult placed with podiatry Dr. Annamary Rummage.  Initially discussed with Dr. Audelia Acton of orthopedics, he advises podiatry covers ankle fractures today [MQ]  1345 No open fracture. Strong pulses and well perfused LEFT foot at this time. CT discussed with Dr. Annamary Rummage and imaging. Advsies likely non-operative at this time. Use CAM [MQ]  1347 Posterior long leg splint advised and then follow-up outpatient.  [MQ]    Clinical Course User Index [MQ] Sharyn Creamer, MD   Patient alert and oriented at  3:30 PM.  She is understanding of her diagnosis of ankle fracture.  Unfortunately, she already uses walker at baseline and does not have assistance at home.  She does not feel that she is able to ambulate safely in her home and does not have wheelchair accessibility.  In this case I have ordered social work consult, physical therapy, and following recommendations of diabetes coordinator for ongoing diabetes management.  She will  need to be observed/boarding in the emergency department pending appropriate disposition and care for her debility due to her acute ankle fracture.  No acute surgery recommended by podiatry, podiatrist advises that he reviewed the CT scan as well, non surgical treatment at this time.   Ongoing care assigned to Dr. Anner Crete.  FINAL CLINICAL IMPRESSION(S) / ED DIAGNOSES   Final diagnoses:  Closed fracture of left ankle, initial encounter     Rx / DC Orders   ED Discharge Orders     None        Note:  This document was prepared using Dragon voice recognition software and may include unintentional dictation errors.   Sharyn Creamer, MD 06/04/23 1945

## 2023-06-04 NOTE — ED Notes (Signed)
 L leg elevated with pillow and ice pack applied to L ankle. Pt tolerated well and reports improvement in comfort.

## 2023-06-04 NOTE — ED Notes (Signed)
 Patient transported to X-ray

## 2023-06-04 NOTE — ED Triage Notes (Signed)
 Pt arrives via [facility] from home for L ankle pain. Pt had mechanical fall last night with her walker. Pt says she is unable to bear weight. Pt arrives in [facility] splint. Pt took 1000mg  tylenol at 0700.

## 2023-06-04 NOTE — Inpatient Diabetes Management (Signed)
 Inpatient Diabetes Program Recommendations  AACE/ADA: New Consensus Statement on Inpatient Glycemic Control (2015)  Target Ranges:  Prepandial:   less than 140 mg/dL      Peak postprandial:   less than 180 mg/dL (1-2 hours)      Critically ill patients:  140 - 180 mg/dL   Lab Results  Component Value Date   GLUCAP 164 (H) 07/31/2020   HGBA1C 8.9 (H) 07/31/2020    Review of Glycemic Control  Latest Reference Range & Units 06/04/23 15:00  Glucose-Capillary 70 - 99 mg/dL 161 (H)   Diabetes history: DM 2 Outpatient Diabetes medications:  70/30 42 units with breakfast and 2 units with supper Current orders for Inpatient glycemic control:  None yet  Inpatient Diabetes Program Recommendations:    While in the hospital, consider adding Semglee 15 units daily (0.15 units/kg) and Novolog sensitive correction tid with meals and HS scale.    Thanks,  Lorenza Cambridge, RN, BC-ADM Inpatient Diabetes Coordinator Pager [phone]  (8a-5p)

## 2023-06-05 ENCOUNTER — Emergency Department

## 2023-06-05 LAB — COMPREHENSIVE METABOLIC PANEL WITH GFR
ALT: 20 U/L (ref 0–44)
AST: 29 U/L (ref 15–41)
Albumin: 3.4 g/dL — ABNORMAL LOW (ref 3.5–5.0)
Alkaline Phosphatase: 74 U/L (ref 38–126)
Anion gap: 7 (ref 5–15)
BUN: 30 mg/dL — ABNORMAL HIGH (ref 8–23)
CO2: 25 mmol/L (ref 22–32)
Calcium: 8.9 mg/dL (ref 8.9–10.3)
Chloride: 107 mmol/L (ref 98–111)
Creatinine, Ser: 1.88 mg/dL — ABNORMAL HIGH (ref 0.44–1.00)
GFR, Estimated: 26 mL/min — ABNORMAL LOW (ref >60)
Glucose, Bld: 125 mg/dL — ABNORMAL HIGH (ref 70–99)
Potassium: 3.9 mmol/L (ref 3.5–5.1)
Sodium: 139 mmol/L (ref 135–145)
Total Bilirubin: 0.7 mg/dL (ref 0.0–1.2)
Total Protein: 6.6 g/dL (ref 6.5–8.1)

## 2023-06-05 LAB — CBG MONITORING, ED
Glucose-Capillary: 170 mg/dL — ABNORMAL HIGH (ref 70–99)
Glucose-Capillary: 171 mg/dL — ABNORMAL HIGH (ref 70–99)
Glucose-Capillary: 212 mg/dL — ABNORMAL HIGH (ref 70–99)
Glucose-Capillary: 195 mg/dL — ABNORMAL HIGH (ref 70–99)

## 2023-06-05 LAB — LACTIC ACID, PLASMA: Lactic Acid, Venous: 1.4 mmol/L (ref 0.5–1.9)

## 2023-06-05 LAB — HEMOGLOBIN A1C
Hgb A1c MFr Bld: 6.6 % — ABNORMAL HIGH (ref 4.8–5.6)
Mean Plasma Glucose: 143 mg/dL

## 2023-06-05 LAB — CBC
HCT: 35.1 % — ABNORMAL LOW (ref 36.0–46.0)
Hemoglobin: 11.5 g/dL — ABNORMAL LOW (ref 12.0–15.0)
MCH: 29.4 pg (ref 26.0–34.0)
MCHC: 32.8 g/dL (ref 30.0–36.0)
MCV: 89.8 fL (ref 80.0–100.0)
Platelets: 253 10*3/uL (ref 150–400)
RBC: 3.91 MIL/uL (ref 3.87–5.11)
RDW: 16.4 % — ABNORMAL HIGH (ref 11.5–15.5)
WBC: 16.6 10*3/uL — ABNORMAL HIGH (ref 4.0–10.5)
nRBC: 0 % (ref 0.0–0.2)

## 2023-06-05 LAB — RESP PANEL BY RT-PCR (RSV, FLU A&B, COVID)  RVPGX2
Influenza A by PCR: NEGATIVE
Influenza B by PCR: NEGATIVE
Resp Syncytial Virus by PCR: NEGATIVE
SARS Coronavirus 2 by RT PCR: NEGATIVE

## 2023-06-05 MED ORDER — SODIUM CHLORIDE 0.9 % IV BOLUS
1000.0000 mL | Freq: Once | INTRAVENOUS | Status: AC
  Administered 2023-06-05: 1000 mL via INTRAVENOUS

## 2023-06-05 NOTE — Evaluation (Signed)
 Occupational Therapy Evaluation Patient Details Name: Laura Hatfield MRN: [id number] DOB: [birth year] Today's Date: 06/05/2023   History of Present Illness   Pt is an 83 yo female that presented to the ED for a fall at home. Imaging showed "oblique mildly displaced midshaft L fibular fracture. Posterior malleolar fracture with widening of the anterior ankle mortise, small cortical fracture off the anterolateral corner of the distal tibia. Small cortical fracture off the medial talus subjacent to the medial malleolus. PMH of HLD, HTN, CAD, DM on insulin, TIA, chronic diarrhea.     Clinical Impressions Chart reviewed to date, pt greeted semi supine in bed in ED hallway, alert and oriented x4. PTA Pt reports she is generally MOD I for ADL, PRN assist for IADL, but she does drive, orders groceries to be picked up, has a cleaning service. Pt presents with deficits in strength, endurance, activity tolerance, balance affecting safe and optimal ADL completion. Refer to flow sheet below. Mobility is limited by low BP- pt reports feeling very weak, improvement in symptoms with return to supine. Educated patient regarding discharge recommendations. OT will continue to follow to facilitate optimal ADL performance/ return to PLOF. Pt left in care of MD and RN.   BP sitting on edge of bed 89/46  Sitting on edge of bed taken approx 1 minute after above BP 64/44 BP in supine 101/42 Semi supine: 107/53       If plan is discharge home, recommend the following:   A lot of help with walking and/or transfers;A lot of help with bathing/dressing/bathroom;Assistance with cooking/housework;Help with stairs or ramp for entrance     Functional Status Assessment   Patient has had a recent decline in their functional status and demonstrates the ability to make significant improvements in function in a reasonable and predictable amount of time.     Equipment Recommendations   Other (comment) (defer to  next venue of care)     Recommendations for Other Services         Precautions/Restrictions   Precautions Precautions: Fall Recall of Precautions/Restrictions: Intact Restrictions Weight Bearing Restrictions Per Provider Order: Yes LLE Weight Bearing Per Provider Order: Non weight bearing Other Position/Activity Restrictions: per secure chat, pt to be NWB     Mobility Bed Mobility Overal bed mobility: Needs Assistance Bed Mobility: Supine to Sit, Sit to Supine     Supine to sit: Min assist, Used rails, HOB elevated Sit to supine: Max assist, +2 for safety/equipment        Transfers                          Balance Overall balance assessment: Needs assistance Sitting-balance support: Feet unsupported, Bilateral upper extremity supported Sitting balance-Leahy Scale: Fair                                     ADL either performed or assessed with clinical judgement   ADL Overall ADL's : Needs assistance/impaired Eating/Feeding: Set up;Sitting   Grooming: Set up;Sitting               Lower Body Dressing: Maximal assistance;Bed level     Toilet Transfer Details (indicate cue type and reason): unable to attempt on this date due to orthostatics           General ADL Comments: limited by orthostatics on this date     Vision  Patient Visual Report: No change from baseline       Perception         Praxis         Pertinent Vitals/Pain Pain Assessment Pain Assessment: 0-10 Pain Score: 5  Pain Descriptors / Indicators: Aching, Sore, Grimacing, Throbbing Pain Intervention(s): Monitored during session, Limited activity within patient's tolerance, Repositioned     Extremity/Trunk Assessment Upper Extremity Assessment Upper Extremity Assessment: Generalized weakness   Lower Extremity Assessment Lower Extremity Assessment: Generalized weakness;LLE deficits/detail LLE Deficits / Details: LLE in splint, externally  rotated       Communication Communication Communication: No apparent difficulties   Cognition Arousal: Alert Behavior During Therapy: WFL for tasks assessed/performed Cognition: No apparent impairments                               Following commands: Intact       Cueing  General Comments   Cueing Techniques: Verbal cues  splint intact pre/post session   Exercises Other Exercises Other Exercises: edu re: role of OT, role of rehab, discharge recommendations, safe ADL completion   Shoulder Instructions      Home Living Family/patient expects to be discharged to:: Private residence Living Arrangements: Alone Available Help at Discharge: Family;Available PRN/intermittently (cousin lives nearby) Type of Home: House Home Access: Stairs to enter Secretary/administrator of Steps: 7 Entrance Stairs-Rails: Right;Left;Can reach both Home Layout: One level         Firefighter: Standard     Home Equipment: Agricultural consultant (2 wheels);Toilet riser          Prior Functioning/Environment Prior Level of Function : Independent/Modified Independent;History of Falls (last six months)             Mobility Comments: amb with RW ADLs Comments: generally MOD I with ADL; intermittent assist for IADL, orders groceries, has a cleaning service    OT Problem List: Decreased strength;Decreased activity tolerance;Decreased knowledge of use of DME or AE;Decreased safety awareness;Impaired balance (sitting and/or standing);Decreased knowledge of precautions   OT Treatment/Interventions: Self-care/ADL training;Therapeutic exercise;Patient/family education;DME and/or AE instruction;Cognitive remediation/compensation;Therapeutic activities;Energy conservation;Balance training      OT Goals(Current goals can be found in the care plan section)   Acute Rehab OT Goals Patient Stated Goal: improve function OT Goal Formulation: With patient Time For Goal Achievement:  06/19/23 Potential to Achieve Goals: Good ADL Goals Pt Will Perform Grooming: with modified independence;sitting Pt Will Perform Lower Body Dressing: with modified independence;sitting/lateral leans;sit to/from stand Pt Will Transfer to Toilet: with modified independence;stand pivot transfer Pt Will Perform Toileting - Clothing Manipulation and hygiene: with modified independence;sit to/from stand;sitting/lateral leans   OT Frequency:  Min 2X/week    Co-evaluation PT/OT/SLP Co-Evaluation/Treatment: Yes Reason for Co-Treatment: For patient/therapist safety;To address functional/ADL transfers   OT goals addressed during session: ADL's and self-care      AM-PAC OT "6 Clicks" Daily Activity     Outcome Measure Help from another person eating meals?: None Help from another person taking care of personal grooming?: None Help from another person toileting, which includes using toliet, bedpan, or urinal?: A Lot Help from another person bathing (including washing, rinsing, drying)?: A Lot Help from another person to put on and taking off regular upper body clothing?: None Help from another person to put on and taking off regular lower body clothing?: A Lot 6 Click Score: 18   End of Session Nurse Communication: Mobility status (vitals)  Activity  Tolerance: Treatment limited secondary to medical complications (Comment) Patient left: in bed (nurse and ED MD present)  OT Visit Diagnosis: Muscle weakness (generalized) (M62.81);Other abnormalities of gait and mobility (R26.89)                Time: 1191-4782 OT Time Calculation (min): 19 min Charges:  OT General Charges $OT Visit: 1 Visit OT Evaluation $OT Eval Moderate Complexity: 1 Mod  Oleta Mouse, OTD OTR/L  06/05/23, 11:03 AM

## 2023-06-05 NOTE — TOC Initial Note (Signed)
 Transition of Care [facility]) - Initial/Assessment Note    Patient Details  Name: Laura Hatfield MRN: [id number] Date of Birth: 1940/08/29  [facility] CM/SW Contact:    Colin Broach, LCSW Phone Number: 06/05/2023, 1:31 PM  Clinical Narrative:                 CSW met with patient to complete assessment.  CSW introduced self and reason for visit.  Patient states that her PCP is Olin Hauser, MD and she uses [employer].  Patient lives alone, but has support from her cousin, Doree Barthel [phone]).  Prior to coming to the hospital, patient was doing ok with her ADL's.  She states that was having a little trouble with bathing and that she has someone who comes in weekly to clean for her.  She's able to feed self.    CSW discussed PT recommendation for SNF.  She is amenable.  Choice list given and bed search started.  PASRR #: [id number].  FL2 completed.  TOC to continue to follow for SNF placement.  Expected Discharge Plan: Skilled Nursing Facility Barriers to Discharge: Continued Medical Work up, SNF Pending bed offer   Patient Goals and CMS Choice   CMS Medicare.gov Compare Post Acute Care list provided to:: Patient        Expected Discharge Plan and Services     Post Acute Care Choice: Skilled Nursing Facility Living arrangements for the past 2 months: Single Family Home                                      Prior Living Arrangements/Services Living arrangements for the past 2 months: Single Family Home Lives with:: Self Patient language and need for interpreter reviewed:: Yes        Need for Family Participation in Patient Care: No (Comment)     Criminal Activity/Legal Involvement Pertinent to Current Situation/Hospitalization: No - Comment as needed  Activities of Daily Living      Permission Sought/Granted                  Emotional Assessment Appearance:: Appears stated age Attitude/Demeanor/Rapport: Engaged,  Gracious Affect (typically observed): Appropriate Orientation: : Oriented to Self, Oriented to Place, Oriented to  Time, Oriented to Situation Alcohol / Substance Use: Not Applicable Psych Involvement: No (comment)  Admission diagnosis:  Fall EMS Patient Active Problem List   Diagnosis Date Noted   Hypertensive urgency 07/26/2017   Aphasia 05/01/2017   HLD (hyperlipidemia) 05/01/2017   Diabetes (HCC) 05/01/2017   CAD (coronary artery disease) 05/01/2017   CKD (chronic kidney disease), stage III (HCC) 05/01/2017   TIA (transient ischemic attack) 03/21/2016   PCP:  Olin Hauser, MD Pharmacy:   [facility] [address] [place] - [address] [place] [address] Phone: [phone] Fax: [phone]  [id number] [place] [place] - [address] AT [facility] [address] [place] [address] Phone: [phone] Fax: [phone]     Social Drivers of Health (SDOH) Social History: SDOH Screenings   Food Insecurity: No Food Insecurity (03/20/2023)   Received from [facility]  Housing: Low Risk  (04/24/2023)   Received from [facility]  Transportation Needs: No Transportation Needs (03/20/2023)   Received from [facility]  Utilities: Not At  Risk (03/20/2023)   Received from [facility]  Alcohol Screen: Low Risk  (03/29/2018)  Financial Resource Strain: Low Risk  (03/20/2023)   Received from [facility]  Tobacco Use: Low Risk  (05/06/2023)   Received from [facility]   SDOH Interventions:     Readmission Risk Interventions     No data to display

## 2023-06-05 NOTE — Evaluation (Signed)
 Physical Therapy Evaluation Patient Details Name: Laura Hatfield MRN: [id number] DOB: July 31, 1940 Today's Date: 06/05/2023  History of Present Illness  Pt is an 83 yo female that presented to the ED for a fall at home. Imaging showed "oblique mildly displaced midshaft L fibular fracture. Posterior malleolar fracture with widening of the anterior ankle mortise, small cortical fracture off the anterolateral corner of the distal tibia. Small cortical fracture off the medial talus subjacent to the medial malleolus. PMH of HLD, HTN, CAD, DM on insulin, TIA, chronic diarrhea.   Clinical Impression  Patient alert agreeable to PT/OT, reported 5/10 throbbing L ankle pain, oriented x4. At baseline pt lives alone and is ambulatory with her RW, groceries are delivered, modI for ADLs. Session limited today due to hypotension. She was able to sit EOB with minA and reliance on bed  rails/extra time, BP initially 89/46, then 64/44 and pt symptomatic throughout. Returned to supine maxAx2 for safety, and once repositioned for breakfast 107/53. RN/MD notified of pt status.  Overall the patient demonstrated deficits (see "PT Problem List") that impede the patient's functional abilities, safety, and mobility and would benefit from skilled PT intervention. Recommend therapy (<3hrs a day) to maximize education, independence, and function.          If plan is discharge home, recommend the following: Two people to help with walking and/or transfers;A lot of help with bathing/dressing/bathroom;Assist for transportation;Assistance with cooking/housework;Help with stairs or ramp for entrance;Direct supervision/assist for medications management   Can travel by private vehicle   No    Equipment Recommendations Other (comment) (TBD)  Recommendations for Other Services       Functional Status Assessment Patient has had a recent decline in their functional status and demonstrates the ability to make significant  improvements in function in a reasonable and predictable amount of time.     Precautions / Restrictions Precautions Precautions: Fall Recall of Precautions/Restrictions: Intact Restrictions Weight Bearing Restrictions Per Provider Order: Yes LLE Weight Bearing Per Provider Order: Non weight bearing Other Position/Activity Restrictions: per secure chat, pt to be NWB      Mobility  Bed Mobility Overal bed mobility: Needs Assistance Bed Mobility: Supine to Sit, Sit to Supine     Supine to sit: Min assist, Used rails, HOB elevated Sit to supine: Max assist, +2 for safety/equipment   General bed mobility comments: maxAx2 to return to supine due to low BP    Transfers                        Ambulation/Gait                  Stairs            Wheelchair Mobility     Tilt Bed    Modified Rankin (Stroke Patients Only)       Balance Overall balance assessment: Needs assistance Sitting-balance support: Feet unsupported, Bilateral upper extremity supported Sitting balance-Leahy Scale: Fair                                       Pertinent Vitals/Pain Pain Assessment Pain Assessment: 0-10 Pain Score: 5  Pain Location: L ankle Pain Descriptors / Indicators: Aching, Sore, Grimacing Pain Intervention(s): Limited activity within patient's tolerance, Monitored during session, Repositioned    Home Living Family/patient expects to be discharged to:: Private residence Living Arrangements: Alone Available Help  at Discharge: Family;Available PRN/intermittently (Cousin lives nearby) Type of Home: House Home Access: Stairs to enter Entrance Stairs-Rails: Right;Left;Can reach both Secretary/administrator of Steps: 7   Home Layout: One level Home Equipment: Agricultural consultant (2 wheels);Toilet riser      Prior Function               Mobility Comments: ambulatory with RW ADLs Comments: orders groceries, has a cleaning service      Extremity/Trunk Assessment   Upper Extremity Assessment Upper Extremity Assessment: Generalized weakness    Lower Extremity Assessment Lower Extremity Assessment: Generalized weakness (L ankle immobilized in ace wrap and posterior splint, noted for ER of entire leg, repositioned as able)       Communication        Cognition Arousal: Alert Behavior During Therapy: WFL for tasks assessed/performed   PT - Cognitive impairments: No apparent impairments                         Following commands: Intact       Cueing       General Comments      Exercises     Assessment/Plan    PT Assessment Patient needs continued PT services  PT Problem List Decreased strength;Decreased range of motion;Decreased knowledge of use of DME;Decreased activity tolerance;Decreased balance;Pain;Decreased mobility;Decreased coordination;Decreased knowledge of precautions       PT Treatment Interventions DME instruction;Neuromuscular re-education;Gait training;Stair training;Patient/family education;Functional mobility training;Therapeutic activities;Therapeutic exercise;Balance training    PT Goals (Current goals can be found in the Care Plan section)  Acute Rehab PT Goals Patient Stated Goal: to get back to PLOF PT Goal Formulation: With patient Time For Goal Achievement: 06/19/23 Potential to Achieve Goals: Good    Frequency Min 3X/week     Co-evaluation               AM-PAC PT "6 Clicks" Mobility  Outcome Measure Help needed turning from your back to your side while in a flat bed without using bedrails?: A Little Help needed moving from lying on your back to sitting on the side of a flat bed without using bedrails?: A Little Help needed moving to and from a bed to a chair (including a wheelchair)?: A Lot Help needed standing up from a chair using your arms (e.g., wheelchair or bedside chair)?: A Lot Help needed to walk in hospital room?: Total Help needed  climbing 3-5 steps with a railing? : Total 6 Click Score: 12    End of Session   Activity Tolerance: Other (comment) (limited by low BP) Patient left: in bed (in ED hallway without call bed, without bed alarm) Nurse Communication: Mobility status;Other (comment) (low BP) PT Visit Diagnosis: Other abnormalities of gait and mobility (R26.89);Difficulty in walking, not elsewhere classified (R26.2);Muscle weakness (generalized) (M62.81);Pain Pain - Right/Left: Left Pain - part of body: Ankle and joints of foot    Time: 9147-8295 PT Time Calculation (min) (ACUTE ONLY): 23 min   Charges:   PT Evaluation $PT Eval Low Complexity: 1 Low PT Treatments $Therapeutic Activity: 8-22 mins PT General Charges $$ ACUTE PT VISIT: 1 Visit        Olga Coaster PT, DPT 10:24 AM,06/05/23

## 2023-06-05 NOTE — TOC Progression Note (Signed)
 Transition of Care [facility]) - Progression Note    Patient Details  Name: Laura Hatfield MRN: [id number] Date of Birth: March 12, 1940  [facility]) CM/SW Contact  Colin Broach, LCSW Phone Number: 06/05/2023, 4:37 PM  Clinical Narrative:   CSW called [employer] to start auth for patient to go to [facility].  TOC working the weekend, please follow-up to make sure that the authorization request went through to [place].      Expected Discharge Plan: Skilled Nursing Facility Barriers to Discharge: Continued Medical Work up, SNF Pending bed offer  Expected Discharge Plan and Services     Post Acute Care Choice: Skilled Nursing Facility Living arrangements for the past 2 months: Single Family Home                                       Social Determinants of Health (SDOH) Interventions SDOH Screenings   Food Insecurity: No Food Insecurity (03/20/2023)   [facility]  Housing: Low Risk  (04/24/2023)   Received from [facility]  Transportation Needs: No Transportation Needs (03/20/2023)   Received from [facility]  Utilities: Not At Risk (03/20/2023)   Received [facility]  Alcohol Screen: Low Risk  (03/29/2018)  Financial Resource Strain: Low Risk  (03/20/2023)   Received from [facility]  Tobacco Use: Low Risk  (05/06/2023)   Received from [facility]    Readmission Risk Interventions     No data to display

## 2023-06-05 NOTE — [place] FL2 (Signed)
 [place] MEDICAID FL2 LEVEL OF CARE FORM     IDENTIFICATION  Patient Name: Laura Hatfield Birthdate: Jul 05, 1940 Sex: female Admission Date (Current Location): 06/04/2023  [facility] and [place] Number:  Chiropodist and Address:  [facility], [address] [place], [place] [address]      Provider Number: [id number]  Attending Physician Name and Address:  No att. providers found  Relative Name and Phone Number:  Aldona Lento,  [phone]    Current Level of Care: Hospital Recommended Level of Care: Skilled Nursing Facility Prior Approval Number:    Date Approved/Denied:   PASRR Number:    Discharge Plan: SNF    Current Diagnoses: Patient Active Problem List   Diagnosis Date Noted   Hypertensive urgency 07/26/2017   Aphasia 05/01/2017   HLD (hyperlipidemia) 05/01/2017   Diabetes (HCC) 05/01/2017   CAD (coronary artery disease) 05/01/2017   CKD (chronic kidney disease), stage III (HCC) 05/01/2017   TIA (transient ischemic attack) 03/21/2016    Orientation RESPIRATION BLADDER Height & Weight     Self, Time, Situation, Place  Normal (on room air) Continent Weight: 210 lb (95.3 kg) Height:  5\' 7"  (170.2 cm)  BEHAVIORAL SYMPTOMS/MOOD NEUROLOGICAL BOWEL NUTRITION STATUS      Continent Diet (heart healthy)  AMBULATORY STATUS COMMUNICATION OF NEEDS Skin   Limited Assist Verbally Normal                       Personal Care Assistance Level of Assistance  Dressing, Bathing Bathing Assistance: Limited assistance   Dressing Assistance: Limited assistance     Functional Limitations Info  Sight, Hearing Sight Info: Adequate Hearing Info: Adequate      SPECIAL CARE FACTORS FREQUENCY  PT (By licensed PT), OT (By licensed OT)     PT Frequency: 5 times per week OT Frequency: 5 times per week            Contractures Contractures Info: Not present    Additional Factors Info  Code Status, Allergies Code  Status Info: full Allergies Info: Atorvastatin; Crestor (Rosuvastatin; Ezetimibe; Lipitor (Atorvastatin Calcium); Lisinopril; Losartan; Penicillins           Current Medications (06/05/2023):  This is the current hospital active medication list Current Facility-Administered Medications  Medication Dose Route Frequency Provider Last Rate Last Admin   aspirin EC tablet 81 mg  81 mg Oral Daily Sharyn Creamer, MD   81 mg at 06/05/23 0943   cholecalciferol (VITAMIN D3) 25 MCG (1000 UNIT) tablet 1,000 Units  1,000 Units Oral QHS Sharyn Creamer, MD   1,000 Units at 06/04/23 2256   insulin aspart (novoLOG) injection 0-5 Units  0-5 Units Subcutaneous QHS Sharyn Creamer, MD   2 Units at 06/04/23 2259   insulin aspart (novoLOG) injection 0-9 Units  0-9 Units Subcutaneous TID WC Sharyn Creamer, MD   2 Units at 06/05/23 1237   insulin glargine-yfgn (SEMGLEE) injection 10 Units  0.1 Units/kg Subcutaneous QHS Sharyn Creamer, MD   10 Units at 06/04/23 2256   oxyCODONE-acetaminophen (PERCOCET/ROXICET) 5-325 MG per tablet 1 tablet  1 tablet Oral Q8H PRN Sharyn Creamer, MD   1 tablet at 06/05/23 0424   Current Outpatient Medications  Medication Sig Dispense Refill   aspirin EC 81 MG tablet Take 81 mg by mouth daily.     cholecalciferol (VITAMIN D) 1000 units tablet Take 1,000 Units by mouth at bedtime.      colestipol (COLESTID) 1 g tablet  Take 2 g by mouth 2 (two) times daily.     NOVOLIN 70/30 KWIKPEN (70-30) 100 UNIT/ML KwikPen Inject 2-42 Units into the skin 2 (two) times daily before a meal. Inject 42 units subcutaneously every morning with breakfast and 2 units every evening with dinner       Discharge Medications: Please see discharge summary for a list of discharge medications.  Relevant Imaging Results:  Relevant Lab Results:   Additional Information ss#: [id number]  [place] Caterin Tabares, LCSW

## 2023-06-05 NOTE — ED Provider Notes (Signed)
-----------------------------------------   9:42 AM on 06/05/2023 -----------------------------------------   Blood pressure (!) 120/58, pulse 94, resp. rate (!) 22, height 5\' 7"  (1.702 m), weight 95.3 kg, SpO2 96%.  The patient is calm and cooperative at this time.  There have been no acute events since the last update.  Awaiting disposition plan from case management/social work.  9:42 AM Updated from the occupational therapist that the patient had a blood pressure that was 89/46 at bedside and was taken again and continued to be low.  Patient was symptomatic at that time so they laid her back down.  Will obtain lab work, give IV fluids and obtain a urine.  States that she is chronic diarrhea.  No new symptoms of urinary tract infection but does state that she gets UTIs in the past.  Does endorse a mild cough will obtain COVID and influenza testing.  Will obtain of chest x-ray.  Tolerating p.o.  Abdomen nontender to palpation.  Did not receive any narcotic pain medication prior to dropping blood pressure.      Corena Herter, MD 06/05/23 [phone]

## 2023-06-06 LAB — CBG MONITORING, ED
Glucose-Capillary: 203 mg/dL — ABNORMAL HIGH (ref 70–99)
Glucose-Capillary: 168 mg/dL — ABNORMAL HIGH (ref 70–99)
Glucose-Capillary: 126 mg/dL — ABNORMAL HIGH (ref 70–99)
Glucose-Capillary: 126 mg/dL — ABNORMAL HIGH (ref 70–99)

## 2023-06-06 LAB — URINALYSIS, W/ REFLEX TO CULTURE (INFECTION SUSPECTED)
Bilirubin Urine: NEGATIVE
Glucose, UA: 50 mg/dL — AB
Hgb urine dipstick: NEGATIVE
Ketones, ur: NEGATIVE mg/dL
Nitrite: NEGATIVE
Protein, ur: 30 mg/dL — AB
Specific Gravity, Urine: 1.017 (ref 1.005–1.030)
pH: 5 (ref 5.0–8.0)

## 2023-06-06 LAB — BASIC METABOLIC PANEL WITH GFR
Anion gap: 9 (ref 5–15)
BUN: 28 mg/dL — ABNORMAL HIGH (ref 8–23)
CO2: 23 mmol/L (ref 22–32)
Calcium: 8.9 mg/dL (ref 8.9–10.3)
Chloride: 108 mmol/L (ref 98–111)
Creatinine, Ser: 1.6 mg/dL — ABNORMAL HIGH (ref 0.44–1.00)
GFR, Estimated: 31 mL/min — ABNORMAL LOW (ref >60)
Glucose, Bld: 144 mg/dL — ABNORMAL HIGH (ref 70–99)
Potassium: 3.9 mmol/L (ref 3.5–5.1)
Sodium: 140 mmol/L (ref 135–145)

## 2023-06-06 LAB — CBC WITH DIFFERENTIAL/PLATELET
Abs Immature Granulocytes: 0.11 10*3/uL — ABNORMAL HIGH (ref 0.00–0.07)
Basophils Absolute: 0.1 10*3/uL (ref 0.0–0.1)
Basophils Relative: 1 %
Eosinophils Absolute: 0.5 10*3/uL (ref 0.0–0.5)
Eosinophils Relative: 3 %
HCT: 33.8 % — ABNORMAL LOW (ref 36.0–46.0)
Hemoglobin: 10.9 g/dL — ABNORMAL LOW (ref 12.0–15.0)
Immature Granulocytes: 1 %
Lymphocytes Relative: 29 %
Lymphs Abs: 4.2 10*3/uL — ABNORMAL HIGH (ref 0.7–4.0)
MCH: 29.1 pg (ref 26.0–34.0)
MCHC: 32.2 g/dL (ref 30.0–36.0)
MCV: 90.4 fL (ref 80.0–100.0)
Monocytes Absolute: 1.7 10*3/uL — ABNORMAL HIGH (ref 0.1–1.0)
Monocytes Relative: 12 %
Neutro Abs: 8 10*3/uL — ABNORMAL HIGH (ref 1.7–7.7)
Neutrophils Relative %: 54 %
Platelets: 250 10*3/uL (ref 150–400)
RBC: 3.74 MIL/uL — ABNORMAL LOW (ref 3.87–5.11)
RDW: 16.3 % — ABNORMAL HIGH (ref 11.5–15.5)
WBC: 14.6 10*3/uL — ABNORMAL HIGH (ref 4.0–10.5)
nRBC: 0 % (ref 0.0–0.2)

## 2023-06-06 LAB — LACTIC ACID, PLASMA: Lactic Acid, Venous: 0.9 mmol/L (ref 0.5–1.9)

## 2023-06-06 MED ORDER — CEPHALEXIN 500 MG PO CAPS
500.0000 mg | ORAL_CAPSULE | Freq: Three times a day (TID) | ORAL | Status: DC
  Administered 2023-06-07 – 2023-06-08 (×4): 500 mg via ORAL
  Filled 2023-06-06 (×4): qty 1

## 2023-06-06 MED ORDER — SODIUM CHLORIDE 0.9 % IV SOLN
1.0000 g | Freq: Once | INTRAVENOUS | Status: AC
  Administered 2023-06-06: 1 g via INTRAVENOUS
  Filled 2023-06-06: qty 10

## 2023-06-06 NOTE — ED Notes (Addendum)
 This RN at bedside, pt requesting to speak with the charge nurse. Pt asking why she was in a hallway. Explained to pt that reasoning behind SW placement pt being in the hallway and the fluctuation and unpredictability of the emergency department. Pt also requesting to speak with SW with where they are in the process of getting her placed. Pt upset that she is in the hallway because she cannot sleep with the bright lights and loud noises. Explained to pt that I will put her in a room at this time but there will be a possibility pt will be moved back out if the room is need. Pt verbalized understanding. Arlyce Lambert, primary RN aware of conversation and pt moved to room 18.

## 2023-06-06 NOTE — ED Notes (Signed)
 PT at [facility].

## 2023-06-06 NOTE — Progress Notes (Addendum)
 Physical Therapy Treatment Patient Details Name: Laura Hatfield MRN: [id number] DOB: Jan 23, 1941 Today's Date: 06/06/2023   History of Present Illness Pt is an 83 yo female that presented to the ED for a fall at home. Imaging showed "oblique mildly displaced midshaft L fibular fracture. Posterior malleolar fracture with widening of the anterior ankle mortise, small cortical fracture off the anterolateral corner of the distal tibia. Small cortical fracture off the medial talus subjacent to the medial malleolus. PMH of HLD, HTN, CAD, DM on insulin, TIA, chronic diarrhea.    PT Comments  Pt A&Ox4, reported throbbing of her ankle, mild-moderate pain signs with mobility. Supine <> sit minA, extra time. BP assessed and WFLs twice in sitting today, pt without dizziness. Sit <> stand from elevated gurney minA, but pt unable to hop or stand pivot to WC. Lateral scoot/squat pivot with minAx2 and definite use of UE support. From WC height maxAx2, and maxAx2 to squat pivot back to elevated gurney. Pt with needs in reach at end of session. The patient would benefit from further skilled PT intervention to continue to progress towards goals.     If plan is discharge home, recommend the following: Two people to help with walking and/or transfers;A lot of help with bathing/dressing/bathroom;Assist for transportation;Assistance with cooking/housework;Help with stairs or ramp for entrance;Direct supervision/assist for medications management   Can travel by private vehicle     No  Equipment Recommendations  Other (comment) (TBD)    Recommendations for Other Services       Precautions / Restrictions Precautions Precautions: Fall Recall of Precautions/Restrictions: Intact Restrictions Weight Bearing Restrictions Per Provider Order: Yes LLE Weight Bearing Per Provider Order: Non weight bearing Other Position/Activity Restrictions: per secure chat, pt to be NWB     Mobility  Bed Mobility Overal bed  mobility: Needs Assistance Bed Mobility: Supine to Sit, Sit to Supine     Supine to sit: Min assist, Used rails, HOB elevated Sit to supine: Min assist        Transfers Overall transfer level: Needs assistance Equipment used: Rolling walker (2 wheels), None Transfers: Sit to/from Stand, Bed to chair/wheelchair/BSC Sit to Stand: Min assist, From elevated surface, Max assist     Squat pivot transfers: Max assist, +2 physical assistance     General transfer comment: minA from elevated gurney, maxAx2 from WC height, maxAx2 squat pivot back to elevated gurney    Ambulation/Gait                   Stairs             Wheelchair Mobility     Tilt Bed    Modified Rankin (Stroke Patients Only)       Balance Overall balance assessment: Needs assistance Sitting-balance support: Feet unsupported, Bilateral upper extremity supported Sitting balance-Leahy Scale: Fair     Standing balance support: Reliant on assistive device for balance, During functional activity, Bilateral upper extremity supported Standing balance-Leahy Scale: Zero                              Communication    Cognition Arousal: Alert Behavior During Therapy: WFL for tasks assessed/performed   PT - Cognitive impairments: No apparent impairments                         Following commands: Intact      Cueing    Exercises Other Exercises  Other Exercises: unable to stand maxA with grab bar with doff/don new briefs. maxAx2 to stand and second assist needed to address briefs.    General Comments        Pertinent Vitals/Pain Pain Assessment Pain Assessment: Faces Faces Pain Scale: Hurts a little bit Pain Location: L ankle Pain Descriptors / Indicators: Throbbing Pain Intervention(s): Limited activity within patient's tolerance, Monitored during session, Repositioned    Home Living                          Prior Function            PT Goals  (current goals can now be found in the care plan section) Progress towards PT goals: Progressing toward goals    Frequency    Min 3X/week      PT Plan      Co-evaluation              AM-PAC PT "6 Clicks" Mobility   Outcome Measure  Help needed turning from your back to your side while in a flat bed without using bedrails?: A Little Help needed moving from lying on your back to sitting on the side of a flat bed without using bedrails?: A Little Help needed moving to and from a bed to a chair (including a wheelchair)?: A Lot Help needed standing up from a chair using your arms (e.g., wheelchair or bedside chair)?: A Lot Help needed to walk in hospital room?: Total Help needed climbing 3-5 steps with a railing? : Total 6 Click Score: 12    End of Session Equipment Utilized During Treatment: Gait belt Activity Tolerance: Patient tolerated treatment well Patient left: in bed (in ED hallway without call bed, without bed alarm) Nurse Communication: Mobility status PT Visit Diagnosis: Other abnormalities of gait and mobility (R26.89);Difficulty in walking, not elsewhere classified (R26.2);Muscle weakness (generalized) (M62.81);Pain Pain - Right/Left: Left Pain - part of body: Ankle and joints of foot     Time: 6283-1517 PT Time Calculation (min) (ACUTE ONLY): 34 min  Charges:    $Therapeutic Activity: 23-37 mins PT General Charges $$ ACUTE PT VISIT: 1 Visit                     Darien Eden PT, DPT 8:52 AM,06/06/23

## 2023-06-06 NOTE — ED Notes (Signed)
 Pt asking to use bedpan, bedpan placed under pt. Pt stated she was finished, bedpan removed, sheet noted to have some urine on it. Sheet changed, new chuck pad placed, warm blankets given.

## 2023-06-06 NOTE — ED Notes (Signed)
 Pt assisted using bedpan without incident.

## 2023-06-06 NOTE — TOC Progression Note (Signed)
 Transition of Care [facility]) - Progression Note    Patient Details  Name: Darryl Blumenstein MRN: [id number] Date of Birth: 1940-04-19  Transition of Care [facility]) CM/SW Contact  Titianna Loomis E Oneisha Ammons, LCSW Phone Number: 06/06/2023, 9:31 AM  Clinical Narrative:    Ronnette Coke - auth is pending.   Expected Discharge Plan: Skilled Nursing Facility Barriers to Discharge: Continued Medical Work up, SNF Pending bed offer  Expected Discharge Plan and Services     Post Acute Care Choice: Skilled Nursing Facility Living arrangements for the past 2 months: Single Family Home                                       Social Determinants of Health (SDOH) Interventions SDOH Screenings   Food Insecurity: No Food Insecurity (03/20/2023)   Received from [facility]  Housing: Low Risk  (04/24/2023)   Received from [facility]  Transportation Needs: No Transportation Needs (03/20/2023)   Received from [facility]  Utilities: Not At Risk (03/20/2023)   Received from [facility]  Alcohol Screen: Low Risk  (03/29/2018)  Financial Resource Strain: Low Risk  (03/20/2023)   Received from [facility]  Tobacco Use: Low Risk  (05/06/2023)   Received from [facility]    Readmission Risk Interventions     No data to display

## 2023-06-06 NOTE — ED Notes (Signed)
Pt walking with PT. 

## 2023-06-06 NOTE — ED Notes (Signed)
 Updated. Denies questions or needs, other sx or complaints. Pain med given.

## 2023-06-06 NOTE — ED Provider Notes (Signed)
 Emergency Medicine Observation Re-evaluation Note  Laura Hatfield is a 83 y.o. female, who is awaiting TOC placement  Physical Exam  BP (!) 149/59 (BP Location: Right Arm)   Pulse 81   Temp 98.7 F (37.1 C) (Oral)   Resp 20   Ht 5\' 7"  (1.702 m)   Wt 95.3 kg   SpO2 97%   BMI 32.89 kg/m    ED Course / MDM  Patient had blood work and urine checked yesterday after reported episode of hypotension. Work up was concerning for leukocytosis as well as UA concerning for infection. Will start antibiotics. Will recheck blood work and add on lactic acid level.    Lactic acid level normal. WBC has improved since check yesterday. Will order further oral antibiotics.  Plan  Current plan is for TOC placement.    Marylynn Soho, MD 06/06/23 2258

## 2023-06-06 NOTE — ED Notes (Addendum)
BS 203

## 2023-06-06 NOTE — ED Notes (Signed)
BS 168

## 2023-06-06 NOTE — ED Notes (Signed)
 CN speaking with pt per pt request. Pt wanting to get out of HW. Alert, NAD, calm, interactive, resps e/u, speaking clearly.

## 2023-06-06 NOTE — ED Notes (Signed)
 Pt eating breakfast. Pt stating she would like to talk to charge nurse. CN informed.

## 2023-06-07 DIAGNOSIS — A419 Sepsis, unspecified organism: Secondary | ICD-10-CM | POA: Diagnosis not present

## 2023-06-07 DIAGNOSIS — N390 Urinary tract infection, site not specified: Secondary | ICD-10-CM

## 2023-06-07 DIAGNOSIS — S82892A Other fracture of left lower leg, initial encounter for closed fracture: Secondary | ICD-10-CM | POA: Insufficient documentation

## 2023-06-07 LAB — CBC WITH DIFFERENTIAL/PLATELET
Abs Immature Granulocytes: 0.13 10*3/uL — ABNORMAL HIGH (ref 0.00–0.07)
Basophils Absolute: 0.1 10*3/uL (ref 0.0–0.1)
Basophils Relative: 1 %
Eosinophils Absolute: 0.5 10*3/uL (ref 0.0–0.5)
Eosinophils Relative: 3 %
HCT: 34.4 % — ABNORMAL LOW (ref 36.0–46.0)
Hemoglobin: 11.2 g/dL — ABNORMAL LOW (ref 12.0–15.0)
Immature Granulocytes: 1 %
Lymphocytes Relative: 24 %
Lymphs Abs: 4.1 10*3/uL — ABNORMAL HIGH (ref 0.7–4.0)
MCH: 29.2 pg (ref 26.0–34.0)
MCHC: 32.6 g/dL (ref 30.0–36.0)
MCV: 89.8 fL (ref 80.0–100.0)
Monocytes Absolute: 1.8 10*3/uL — ABNORMAL HIGH (ref 0.1–1.0)
Monocytes Relative: 11 %
Neutro Abs: 10.8 10*3/uL — ABNORMAL HIGH (ref 1.7–7.7)
Neutrophils Relative %: 60 %
Platelets: 271 10*3/uL (ref 150–400)
RBC: 3.83 MIL/uL — ABNORMAL LOW (ref 3.87–5.11)
RDW: 16 % — ABNORMAL HIGH (ref 11.5–15.5)
WBC: 17.4 10*3/uL — ABNORMAL HIGH (ref 4.0–10.5)
nRBC: 0 % (ref 0.0–0.2)

## 2023-06-07 LAB — URINALYSIS, ROUTINE W REFLEX MICROSCOPIC
Bilirubin Urine: NEGATIVE
Glucose, UA: NEGATIVE mg/dL
Ketones, ur: NEGATIVE mg/dL
Nitrite: NEGATIVE
Protein, ur: 30 mg/dL — AB
Specific Gravity, Urine: 1.012 (ref 1.005–1.030)
WBC, UA: >50 WBC/hpf (ref 0–5)
pH: 5 (ref 5.0–8.0)

## 2023-06-07 LAB — RESP PANEL BY RT-PCR (RSV, FLU A&B, COVID)  RVPGX2
Influenza A by PCR: NEGATIVE
Influenza B by PCR: NEGATIVE
Resp Syncytial Virus by PCR: NEGATIVE
SARS Coronavirus 2 by RT PCR: NEGATIVE

## 2023-06-07 LAB — CBG MONITORING, ED
Glucose-Capillary: 130 mg/dL — ABNORMAL HIGH (ref 70–99)
Glucose-Capillary: 170 mg/dL — ABNORMAL HIGH (ref 70–99)
Glucose-Capillary: 198 mg/dL — ABNORMAL HIGH (ref 70–99)

## 2023-06-07 LAB — LACTIC ACID, PLASMA
Lactic Acid, Venous: 1.2 mmol/L (ref 0.5–1.9)
Lactic Acid, Venous: 1.2 mmol/L (ref 0.5–1.9)

## 2023-06-07 LAB — BASIC METABOLIC PANEL WITH GFR
Anion gap: 7 (ref 5–15)
BUN: 23 mg/dL (ref 8–23)
CO2: 22 mmol/L (ref 22–32)
Calcium: 8.8 mg/dL — ABNORMAL LOW (ref 8.9–10.3)
Chloride: 106 mmol/L (ref 98–111)
Creatinine, Ser: 1.48 mg/dL — ABNORMAL HIGH (ref 0.44–1.00)
GFR, Estimated: 34 mL/min — ABNORMAL LOW (ref >60)
Glucose, Bld: 202 mg/dL — ABNORMAL HIGH (ref 70–99)
Potassium: 4.3 mmol/L (ref 3.5–5.1)
Sodium: 135 mmol/L (ref 135–145)

## 2023-06-07 MED ORDER — SODIUM CHLORIDE 0.9 % IV SOLN
2.0000 g | INTRAVENOUS | Status: DC
  Administered 2023-06-08: 2 g via INTRAVENOUS
  Filled 2023-06-07 (×3): qty 20

## 2023-06-07 MED ORDER — ACETAMINOPHEN 650 MG RE SUPP: 650.0000 mg | Freq: Four times a day (QID) | RECTAL | Status: DC | PRN

## 2023-06-07 MED ORDER — HYDROCODONE-ACETAMINOPHEN 5-325 MG PO TABS: 1.0000 | ORAL_TABLET | ORAL | Status: DC | PRN

## 2023-06-07 MED ORDER — ENOXAPARIN SODIUM 60 MG/0.6ML IJ SOSY
0.5000 mg/kg | PREFILLED_SYRINGE | INTRAMUSCULAR | Status: DC
  Administered 2023-06-07 – 2023-06-08 (×2): 47.5 mg via SUBCUTANEOUS
  Filled 2023-06-07 (×2): qty 0.6

## 2023-06-07 MED ORDER — ONDANSETRON HCL 4 MG PO TABS
4.0000 mg | ORAL_TABLET | Freq: Four times a day (QID) | ORAL | Status: DC | PRN
  Filled 2023-06-07: qty 1

## 2023-06-07 MED ORDER — ACETAMINOPHEN 325 MG PO TABS: 650.0000 mg | ORAL_TABLET | Freq: Four times a day (QID) | ORAL | Status: DC | PRN

## 2023-06-07 MED ORDER — OXYCODONE HCL 5 MG PO TABS
5.0000 mg | ORAL_TABLET | Freq: Once | ORAL | Status: AC
  Administered 2023-06-07: 5 mg via ORAL
  Filled 2023-06-07: qty 1

## 2023-06-07 MED ORDER — SODIUM CHLORIDE 0.9 % IV SOLN
1.0000 g | Freq: Once | INTRAVENOUS | Status: AC
  Administered 2023-06-07: 1 g via INTRAVENOUS
  Filled 2023-06-07: qty 10

## 2023-06-07 MED ORDER — ACETAMINOPHEN 500 MG PO TABS
1000.0000 mg | ORAL_TABLET | Freq: Once | ORAL | Status: AC
  Administered 2023-06-07: 1000 mg via ORAL
  Filled 2023-06-07: qty 2

## 2023-06-07 MED ORDER — MORPHINE SULFATE (PF) 2 MG/ML IV SOLN: 2.0000 mg | INTRAVENOUS | Status: DC | PRN

## 2023-06-07 MED ORDER — ONDANSETRON HCL 4 MG/2ML IJ SOLN: 4.0000 mg | Freq: Four times a day (QID) | INTRAMUSCULAR | Status: DC | PRN

## 2023-06-07 NOTE — ED Notes (Signed)
 Patient moved from stretcher to hospital bed, and repositioned to comfort. Pericare provided and gown changed. Pt's belonging placed in belongings bag, and placed on bench in room. Pt given the call light.

## 2023-06-07 NOTE — Assessment & Plan Note (Signed)
Continue basal insulin with sliding scale coverage

## 2023-06-07 NOTE — Assessment & Plan Note (Signed)
 Continue home meds

## 2023-06-07 NOTE — Assessment & Plan Note (Signed)
 Sepsis criteria: Fever, tachycardia and infection Rocephin and IV fluids Follow cultures

## 2023-06-07 NOTE — Assessment & Plan Note (Signed)
 [place] on 4/11 Outpatient podiatry follow-up

## 2023-06-07 NOTE — Progress Notes (Signed)
 PHARMACIST - PHYSICIAN COMMUNICATION  CONCERNING:  Enoxaparin (Lovenox) for DVT Prophylaxis    RECOMMENDATION: Patient was prescribed enoxaprin 40mg  q24 hours for VTE prophylaxis.   Filed Weights   06/04/23 1703  Weight: 95.3 kg (210 lb)    Body mass index is 32.89 kg/m.  Estimated Creatinine Clearance: 32.9 mL/min (A) (by C-G formula based on SCr of 1.48 mg/dL (H)).   Based on [facility] policy patient is candidate for enoxaparin 0.5mg /kg TBW SQ every 24 hours based on BMI being >30.  DESCRIPTION: Pharmacy has adjusted enoxaparin dose per [facility] policy.  Patient is now receiving enoxaparin 47.5 mg every 24 hours    Ananias Balls, PharmD Clinical Pharmacist  06/07/2023 8:52 PM

## 2023-06-07 NOTE — ED Notes (Signed)
 Patient given a breakfast tray.

## 2023-06-07 NOTE — ED Notes (Signed)
 Pt placed on bedpan , barrier cream applied

## 2023-06-07 NOTE — Consult Note (Signed)
 PODIATRY CONSULTATION  NAME Laura Hatfield MRN [id number] DOB 30-Dec-1940 DOA 06/04/2023   Reason for consult:  Chief Complaint  Patient presents with   Ankle Pain    Attending/Consulting physician: Iver Marker, MD  History of present illness: 83 y.o. female with PMH of diabetes with neuropathy, CAD, stage III CKD, TIA, hyperlipidemia who came to the ED after a fall from standing height with left ankle pain. Patient found to have sustained mildly displaced midshaft oblique fibula fracture, small posterior malleolus fracture approximately 10% of the ankle mortise, small avulsion fracture of anterior lateral tibia, small cortical fracture of the medial talus distal to the medial malleolus. Dr. Rosemarie Conquest had recommended outpatient management. The patient sees podiatrist through [facility] for right subfirst metatarsal head preulcerative ulcer which is currently callused over, she is requesting assessment of this today.  Past Medical History:  Diagnosis Date   CAD (coronary artery disease)    Diabetes mellitus without complication (HCC)    Hyperlipidemia    Thyroid nodule        Latest Ref Rng & Units 06/06/2023    5:32 PM 06/05/2023   10:06 AM 08/14/2022    9:12 AM  CBC  WBC 4.0 - 10.5 K/uL 14.6  16.6  8.6   Hemoglobin 12.0 - 15.0 g/dL 40.9  81.1  91.4   Hematocrit 36.0 - 46.0 % 33.8  35.1  41.2   Platelets 150 - 400 K/uL 250  253  273        Latest Ref Rng & Units 06/06/2023    5:32 PM 06/05/2023   10:06 AM 08/14/2022    9:12 AM  BMP  Glucose 70 - 99 mg/dL 782  956  213   BUN 8 - 23 mg/dL 28  30  26    Creatinine 0.44 - 1.00 mg/dL 0.86  5.78  4.69   Sodium 135 - 145 mmol/L 140  139  140   Potassium 3.5 - 5.1 mmol/L 3.9  3.9  3.7   Chloride 98 - 111 mmol/L 108  107  106   CO2 22 - 32 mmol/L 23  25  23    Calcium 8.9 - 10.3 mg/dL 8.9  8.9  9.0       Physical Exam: Lower Extremity Exam Vasc: Palpable DP and PT pulses right foot.  Cap refill time less than 3  seconds to the digits.  Edema present bilaterally.  Examination limited by left lower extremity dressings  Derm: R -preulcerative callus present subfirst metatarsal head with central area scabbed over.  Stable appearing.  No fluctuance, no erythema.  L -examination limited by left lower extremity splint and dressings which was left intact today  MSK:  Left ankle fracture as described above  Neuro: R - Gross sensation decreased to toes. Gross motor function intact   L - Gross sensation decreased to toes. Gross motor function intact  Left ankle CT scan: IMPRESSION: 1. Fracture of the posterior malleolus with mild superior displacement of the fracture fragment. 2. Small cortical fracture off the anterolateral corner of the distal tibia. 3. Small cortical fracture off the medial talus subjacent to the medial malleolus. 4. 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 5. 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     Electronically Signed   By: Etheleen Her M.D.   On: 06/04/2023 12:02  Plan film radiographs: Left tib-fib views FINDINGS: Positioning limits study. There is an oblique fracture through the midshaft of  the left fibula, mildly displaced. Fractures through the posterior malleolus in the distal tibia. Widening of the ankle mortise anteriorly.   IMPRESSION: Oblique mildly displaced midshaft fibular fracture. Posterior malleolar fracture with widening of the anterior ankle mortise on the lateral view. Recommend dedicated ankle series.  Electronically Signed   By: Janeece Mechanic M.D.   On: 06/04/2023 11:07  ASSESSMENT/PLAN OF CARE 83 y.o. female with PMHx significant for  diabetes with neuropathy, CAD, stage III CKD, TIA, hyperlipidemia with left ankle fracture, patient requesting evaluation of right subfirst metatarsal head preulcerative callus  - Clinical findings reviewed with patient - Right  subfirst metatarsal head preulcerative callus appears stable.  Gauze dressing applied.  Patient requesting padded foam dressing to be applied if possible.  Will place order for this.  Follow-up with her regular podiatrist at [facility] clinic for this -Regarding the left ankle fracture, she is currently boarding in the ED awaiting placement.  She may follow-up in our [place] office. May discuss surgical vs conservative treatment at that time - Strict nonweightbearing to left lower extremity in the interim   Thank you for the consult.  Please contact me directly with any questions or concerns.           Eve Hinders, DPM [facility]    [address] [place], [place] [address]                Office [phone]  Fax [phone]

## 2023-06-07 NOTE — ED Notes (Signed)
CCMD notified

## 2023-06-07 NOTE — ED Provider Notes (Signed)
-----------------------------------------   3:33 AM on 06/07/2023 -----------------------------------------   Blood pressure (!) 145/73, pulse 95, temperature 98.5 F (36.9 C), temperature source Oral, resp. rate 17, height 1.702 m (5\' 7" ), weight 95.3 kg, SpO2 100%.  The patient is calm and cooperative at this time.  There have been no acute events since the last update.  Awaiting disposition plan from [facility] team.   Lynnda Sas, MD 06/07/23 [phone]

## 2023-06-07 NOTE — H&P (Incomplete)
 History and Physical    Patient: Laura Hatfield UEA:[id number] DOB: 1940/05/30 DOA: 06/04/2023 DOS: the patient was seen and examined on 06/07/2023 PCP: Coral Der, MD  Patient coming from: Home  Chief Complaint:  Chief Complaint  Patient presents with   Ankle Pain    HPI: Laura Hatfield is a 83 y.o. female with medical history significant for CAD, HTN,   CKD lllb and diabetes being admitted for sepsis secondary to UTI, after being boarded in the ED for several days awaiting disposition following an accidental left ankle fracture.  She was initially seen in the ED on 4/10, for the ankle fracture, seen by podiatry and splinted and was awaiting placement due to baseline need for walker for ambulation.  2 days ago she developed a fever and was treated empirically for UTI with Rocephin transition to oral antibiotics however, today she again spiked a fever and met sepsis criteria thus the reason for request for admission.  Urine culture done 2 days prior has not yet been resulted. Vitals: Temp 100.4, pulse 96, BP 153/68 Labs with WBC 17,000, lactic acid 1.2 Hemoglobin11.2 which is her baseline Creatinine at 1.88, near baseline Respiratory viral panel negative Urinalysis showed large leuks and rare bacteria   Review of Systems: As mentioned in the history of present illness. All other systems reviewed and are negative.  Past Medical History:  Diagnosis Date   CAD (coronary artery disease)    Diabetes mellitus without complication (HCC)    Hyperlipidemia    Thyroid nodule    Past Surgical History:  Procedure Laterality Date   ANKLE SURGERY     CHOLECYSTECTOMY     EYE SURGERY     HUMERUS SURGERY     Social History:  reports that she has never smoked. She has never used smokeless tobacco. She reports that she does not drink alcohol and does not use drugs.  Allergies  Allergen Reactions   Atorvastatin Other (See Comments)    Dr told her taking these were affecting her  liver  Other Reaction(s): Muscle Pain, Not available   Crestor [Rosuvastatin]    Ezetimibe Other (See Comments)    arthralgia   Lipitor [Atorvastatin Calcium]    Lisinopril Cough   Losartan Other (See Comments)    Passing out/dropped B/P/dropped blood sugar   Penicillins Swelling    Ceftriaxone 08/14/22 w/o allergic reaction      Family History  Problem Relation Age of Onset   Hypertension Mother    Diabetes Mother    Hypertension Father    Stroke Father     Prior to Admission medications   Medication Sig Start Date End Date Taking? Authorizing Provider  aspirin EC 81 MG tablet Take 81 mg by mouth daily.   Yes [provider]  cholecalciferol (VITAMIN D) 1000 units tablet Take 1,000 Units by mouth at bedtime.    Yes [provider]  colestipol (COLESTID) 1 g tablet Take 2 g by mouth 2 (two) times daily. 03/12/23 06/10/23 Yes [provider]  NOVOLIN 70/30 KWIKPEN (70-30) 100 UNIT/ML KwikPen Inject 2-42 Units into the skin 2 (two) times daily before a meal. Inject 42 units subcutaneously every morning with breakfast and 2 units every evening with dinner 03/11/23  Yes [provider]  escitalopram (LEXAPRO) 5 MG tablet Take 5 mg by mouth daily. 12/15/17 12/14/19  [provider]  furosemide (LASIX) 40 MG tablet Take 60 mg by mouth daily as needed for fluid.  01/04/15 12/14/19  [provider]  gabapentin (NEURONTIN) 100 MG capsule Take 100 mg by mouth at bedtime as needed (for pain).   12/14/19  [provider]  metoprolol succinate (TOPROL-XL) 100 MG 24 hr tablet Take 150 mg by mouth at bedtime.   12/14/19  [provider]  rosuvastatin (CRESTOR) 40 MG tablet Take 20 mg by mouth at bedtime.   01/24/19  [provider]    Physical Exam: Vitals:   06/07/23 1056 06/07/23 1817 06/07/23 1817 06/07/23 1921  BP: (!) 152/80  (!) 153/68   Pulse: 94  96 81  Resp: 18  18   Temp: 98.8 F (37.1 C) (!) 100.4 F  (38 C)  99 F (37.2 C)  TempSrc: Oral Oral  Oral  SpO2: 100%  97% 95%  Weight:      Height:       Physical Exam Vitals and nursing note reviewed.  Constitutional:      General: She is not in acute distress. HENT:     Head: Normocephalic and atraumatic.  Cardiovascular:     Rate and Rhythm: Normal rate and regular rhythm.     Heart sounds: Normal heart sounds.  Pulmonary:     Effort: Pulmonary effort is normal.     Breath sounds: Normal breath sounds.  Abdominal:     Palpations: Abdomen is soft.     Tenderness: There is no abdominal tenderness.  Musculoskeletal:     Comments: Left ankle in splint  Neurological:     Mental Status: Mental status is at baseline.     Labs on Admission: I have personally reviewed following labs and imaging studies  CBC: Recent Labs  Lab 06/05/23 1006 06/06/23 1732 06/07/23 1833  WBC 16.6* 14.6* 17.4*  NEUTROABS  --  8.0* 10.8*  HGB 11.5* 10.9* 11.2*  HCT 35.1* 33.8* 34.4*  MCV 89.8 90.4 89.8  PLT 253 250 271   Basic Metabolic Panel: Recent Labs  Lab 06/05/23 1006 06/06/23 1732 06/07/23 1833  NA 139 140 135  K 3.9 3.9 4.3  CL 107 108 106  CO2 25 23 22   GLUCOSE 125* 144* 202*  BUN 30* 28* 23  CREATININE 1.88* 1.60* 1.48*  CALCIUM 8.9 8.9 8.8*   GFR: Estimated Creatinine Clearance: 32.9 mL/min (A) (by C-G formula based on SCr of 1.48 mg/dL (H)). Liver Function Tests: Recent Labs  Lab 06/05/23 1006  AST 29  ALT 20  ALKPHOS 74  BILITOT 0.7  PROT 6.6  ALBUMIN 3.4*   No results for input(s): "LIPASE", "AMYLASE" in the last 168 hours. No results for input(s): "AMMONIA" in the last 168 hours. Coagulation Profile: No results for input(s): "INR", "PROTIME" in the last 168 hours. Cardiac Enzymes: No results for input(s): "CKTOTAL", "CKMB", "CKMBINDEX", "TROPONINI" in the last 168 hours. BNP (last 3 results) No results for input(s): "PROBNP" in the last 8760 hours. HbA1C: No results for input(s): "HGBA1C" in the last 72  hours. CBG: Recent Labs  Lab 06/06/23 1733 06/06/23 2209 06/07/23 0719 06/07/23 1311 06/07/23 1809  GLUCAP 126* 126* 130* 170* 198*   Lipid Profile: No results for input(s): "CHOL", "HDL", "LDLCALC", "TRIG", "CHOLHDL", "LDLDIRECT" in the last 72 hours. Thyroid Function Tests: No results for input(s): "TSH", "T4TOTAL", "FREET4", "T3FREE", "THYROIDAB" in the last 72 hours. Anemia Panel: No results for input(s): "VITAMINB12", "FOLATE", "FERRITIN", "TIBC", "IRON", "RETICCTPCT" in the last 72 hours. Urine analysis:    Component Value Date/Time   COLORURINE YELLOW (A) 06/07/2023 1609   APPEARANCEUR CLOUDY (A) 06/07/2023 1609  APPEARANCEUR CLOUDY 01/20/2014 1357   LABSPEC 1.012 06/07/2023 1609   LABSPEC 1.020 01/20/2014 1357   PHURINE 5.0 06/07/2023 1609   GLUCOSEU NEGATIVE 06/07/2023 1609   GLUCOSEU NEGATIVE 01/20/2014 1357   HGBUR SMALL (A) 06/07/2023 1609   BILIRUBINUR NEGATIVE 06/07/2023 1609   BILIRUBINUR NEGATIVE 01/20/2014 1357   KETONESUR NEGATIVE 06/07/2023 1609   PROTEINUR 30 (A) 06/07/2023 1609   NITRITE NEGATIVE 06/07/2023 1609   LEUKOCYTESUR LARGE (A) 06/07/2023 1609   LEUKOCYTESUR MODERATE 01/20/2014 1357    Radiological Exams on Admission: No results found.   Data Reviewed: Relevant notes from primary care and specialist visits, past discharge summaries as available in EHR, including Care Everywhere. Prior diagnostic testing as pertinent to current admission diagnoses Updated medications and problem lists for reconciliation ED course, including vitals, labs, imaging, treatment and response to treatment Triage notes, nursing and pharmacy notes and ED provider's notes Notable results as noted in HPI   Assessment and Plan: * Sepsis secondary to UTI [facility]) Sepsis criteria: Fever, tachycardia and infection Rocephin and IV fluids Follow cultures  Stage 3b chronic kidney disease (HCC) At baseline  Diabetes (HCC) Continue basal insulin with sliding scale  coverage  CAD (coronary artery disease) Continue home meds  Closed left ankle fracture Splinted on 4/11 Outpatient podiatry follow-up    DVT prophylaxis: Lovenox  Consults: none  Advance Care Planning:   Code Status: Prior   Family Communication: none  Disposition Plan: Back to previous home environment  Severity of Illness: The appropriate patient status for this patient is OBSERVATION. Observation status is judged to be reasonable and necessary in order to provide the required intensity of service to ensure the patient's safety. The patient's presenting symptoms, physical exam findings, and initial radiographic and laboratory data in the context of their medical condition is felt to place them at decreased risk for further clinical deterioration. Furthermore, it is anticipated that the patient will be medically stable for discharge from the hospital within 2 midnights of admission.   Author: Lanetta Pion, MD 06/07/2023 8:47 PM  For on call review [email or web address].

## 2023-06-07 NOTE — ED Notes (Signed)
 Per lab, urine culture is in process still due to being a send out lab it should be resulted and released later tonight or in the morning, Dr. Hendrick Locke, MD notified

## 2023-06-07 NOTE — Assessment & Plan Note (Signed)
 At baseline

## 2023-06-07 NOTE — ED Notes (Signed)
 Spoke with lab regarding urine culture results, they state they were processed and unsure why they aren't visible for the provider, states they will call to verify they have been released.

## 2023-06-08 ENCOUNTER — Encounter: Payer: Self-pay | Admitting: Internal Medicine

## 2023-06-08 DIAGNOSIS — I2510 Atherosclerotic heart disease of native coronary artery without angina pectoris: Secondary | ICD-10-CM | POA: Diagnosis not present

## 2023-06-08 DIAGNOSIS — N1832 Chronic kidney disease, stage 3b: Secondary | ICD-10-CM

## 2023-06-08 DIAGNOSIS — A419 Sepsis, unspecified organism: Secondary | ICD-10-CM | POA: Diagnosis not present

## 2023-06-08 DIAGNOSIS — S82892A Other fracture of left lower leg, initial encounter for closed fracture: Secondary | ICD-10-CM

## 2023-06-08 LAB — GLUCOSE, CAPILLARY
Glucose-Capillary: 186 mg/dL — ABNORMAL HIGH (ref 70–99)
Glucose-Capillary: 206 mg/dL — ABNORMAL HIGH (ref 70–99)
Glucose-Capillary: 153 mg/dL — ABNORMAL HIGH (ref 70–99)
Glucose-Capillary: 196 mg/dL — ABNORMAL HIGH (ref 70–99)

## 2023-06-08 LAB — BASIC METABOLIC PANEL WITH GFR
Anion gap: 7 (ref 5–15)
BUN: 26 mg/dL — ABNORMAL HIGH (ref 8–23)
CO2: 24 mmol/L (ref 22–32)
Calcium: 8.9 mg/dL (ref 8.9–10.3)
Chloride: 107 mmol/L (ref 98–111)
Creatinine, Ser: 1.67 mg/dL — ABNORMAL HIGH (ref 0.44–1.00)
GFR, Estimated: 30 mL/min — ABNORMAL LOW (ref >60)
Glucose, Bld: 217 mg/dL — ABNORMAL HIGH (ref 70–99)
Potassium: 4 mmol/L (ref 3.5–5.1)
Sodium: 138 mmol/L (ref 135–145)

## 2023-06-08 LAB — CBC
HCT: 33.2 % — ABNORMAL LOW (ref 36.0–46.0)
Hemoglobin: 10.7 g/dL — ABNORMAL LOW (ref 12.0–15.0)
MCH: 28.4 pg (ref 26.0–34.0)
MCHC: 32.2 g/dL (ref 30.0–36.0)
MCV: 88.1 fL (ref 80.0–100.0)
Platelets: 282 10*3/uL (ref 150–400)
RBC: 3.77 MIL/uL — ABNORMAL LOW (ref 3.87–5.11)
RDW: 16.3 % — ABNORMAL HIGH (ref 11.5–15.5)
WBC: 16.3 10*3/uL — ABNORMAL HIGH (ref 4.0–10.5)
nRBC: 0 % (ref 0.0–0.2)

## 2023-06-08 LAB — URINE CULTURE

## 2023-06-08 MED ORDER — SENNOSIDES-DOCUSATE SODIUM 8.6-50 MG PO TABS
1.0000 | ORAL_TABLET | Freq: Two times a day (BID) | ORAL | Status: DC
  Administered 2023-06-08 – 2023-06-15 (×11): 1 via ORAL
  Filled 2023-06-08 (×14): qty 1

## 2023-06-08 MED ORDER — POLYETHYLENE GLYCOL 3350 17 G PO PACK
17.0000 g | PACK | Freq: Every day | ORAL | Status: DC
  Administered 2023-06-08 – 2023-06-15 (×6): 17 g via ORAL
  Filled 2023-06-08 (×8): qty 1

## 2023-06-08 NOTE — Plan of Care (Signed)
  Problem: Coping: Goal: Ability to adjust to condition or change in health will improve Outcome: Progressing   Problem: Health Behavior/Discharge Planning: Goal: Ability to manage health-related needs will improve Outcome: Progressing   Problem: Nutritional: Goal: Maintenance of adequate nutrition will improve Outcome: Progressing

## 2023-06-08 NOTE — TOC Progression Note (Signed)
 Transition of Care [facility]) - Progression Note    Patient Details  Name: Laura Hatfield MRN: [id number] Date of Birth: October 17, 1940  [facility] of [facility]) CM/SW Contact  Elmira Haddock, LCSW Phone Number: 06/08/2023, 5:35 PM  Clinical Narrative:  SNF [facility] has been denied per Jarvis Mesa at [facility].  She reports that patient's BP has been dropping whenever she works with PT.  Once patient is medically stable and can work with PT without the BP dropping, another auth request can be placed.  Patient can appeal this decision by calling: [phone] per [facility] from [facility].  Reference #: [id number].    Expected Discharge Plan: Skilled Nursing Facility Barriers to Discharge: Continued Medical Work up, SNF Pending bed offer  Expected Discharge Plan and Services     Post Acute Care Choice: Skilled Nursing Facility Living arrangements for the past 2 months: Single Family Home                                       Social Determinants of Health (SDOH) Interventions SDOH Screenings   Food Insecurity: No Food Insecurity (06/08/2023)  Housing: Low Risk  (06/08/2023)  Transportation Needs: No Transportation Needs (06/08/2023)  Utilities: Not At Risk (06/08/2023)  Alcohol Screen: Low Risk  (03/29/2018)  Financial Resource Strain: Low Risk  (03/20/2023)   Received from [facility]  Social Connections: Unknown (06/08/2023)  Tobacco Use: Low Risk  (06/08/2023)    Readmission Risk Interventions     No data to display

## 2023-06-08 NOTE — Progress Notes (Signed)
 [facility]  - [place] at [facility]   PATIENT NAME: Laura Hatfield    MR#:  [id number]  DATE OF BIRTH:  [birth year]  SUBJECTIVE:  patient developed fever low-grade in the ER. She was boarded in the ER for placement. Urine was abnormal. Admitted for IV antibiotics. Currently overall doing well. No fever today. No family at bedside.    VITALS:  Blood pressure (!) 142/65, pulse 80, temperature 100.3 F (37.9 C), resp. rate 16, height 5\' 7"  (1.702 m), weight 95 kg, SpO2 96%.  PHYSICAL EXAMINATION:   GENERAL:  83 y.o.-year-old patient with no acute distress. Obese LUNGS: Normal breath sounds bilaterally,  CARDIOVASCULAR: S1, S2 normal. No murmur   ABDOMEN: Soft, nontender, nondistended. EXTREMITIES: left LE splint +   NEUROLOGIC: nonfocal  patient is alert and awake   LABORATORY PANEL:  CBC Recent Labs  Lab 06/08/23 0218  WBC 16.3*  HGB 10.7*  HCT 33.2*  PLT 282    Chemistries  Recent Labs  Lab 06/05/23 1006 06/06/23 1732 06/08/23 0218  NA 139   < > 138  K 3.9   < > 4.0  CL 107   < > 107  CO2 25   < > 24  GLUCOSE 125*   < > 217*  BUN 30*   < > 26*  CREATININE 1.88*   < > 1.67*  CALCIUM 8.9   < > 8.9  AST 29  --   --   ALT 20  --   --   ALKPHOS 74  --   --   BILITOT 0.7  --   --    < > = values in this interval not displayed.   Assessment and Plan Laura Hatfield is a 83 y.o. female with medical history significant for CAD, HTN,   CKD lllb and diabetes being admitted for sepsis secondary to UTI, after being boarded in the ED for several days awaiting disposition following an accidental left ankle fracture.  She was initially seen in the ED on 4/10, for the ankle fracture, seen by podiatry and splinted and was awaiting placement due to baseline need for walker for ambulation.  2 days ago she developed a fever and was treated empirically for UTI with Rocephin transition to oral antibiotics however, today she again spiked a fever and met sepsis  criteria thus the reason for request for admission.  Urinalysis showed large leuks and rare bacteria   Sepsis secondary to UTI [facility]) --Sepsis criteria: Fever, tachycardia and infection --Rocephin and IV fluids --Urine cultures from 4/12-- multiple species --UC from 4/13 pending --WBC trending dow --will switch to oral antibiotic from tomorrow. -- Overall appears stable   Stage 3b chronic kidney disease (HCC) --At baseline   Diabetes (HCC) --Continue basal insulin with sliding scale coverage   CAD (coronary artery disease) --Continue home meds   Closed left ankle fracture --Splinted on 4/11 --Outpatient podiatry follow-up with Dr Rosemarie Conquest       DVT prophylaxis: Lovenox   Consults: none   Advance Care Planning:   Code Status: Prior    Family Communication: none   Disposition Plan: [facility] Level of care: Telemetry Medical Status is: Observation The patient remains OBS appropriate and will d/c before 2 midnights.    TOTAL TIME TAKING CARE OF THIS PATIENT: 50 minutes.  >50% time spent on counselling and coordination of care  Note: This dictation was prepared with Dragon dictation along with smaller phrase technology. Any transcriptional errors that  result from this process are unintentional.  [name] M.D    [facility]   CC: Primary care physician; Coral Der, MD

## 2023-06-08 NOTE — Plan of Care (Signed)
  Problem: Education: Goal: Ability to describe self-care measures that may prevent or decrease complications (Diabetes Survival Skills Education) will improve Outcome: Progressing Goal: Individualized Educational Video(s) Outcome: Progressing   Problem: Coping: Goal: Ability to adjust to condition or change in health will improve Outcome: Progressing   Problem: Fluid Volume: Goal: Ability to maintain a balanced intake and output will improve Outcome: Progressing   Problem: Health Behavior/Discharge Planning: Goal: Ability to identify and utilize available resources and services will improve Outcome: Progressing Goal: Ability to manage health-related needs will improve Outcome: Progressing   Problem: Nutritional: Goal: Maintenance of adequate nutrition will improve Outcome: Progressing Goal: Progress toward achieving an optimal weight will improve Outcome: Progressing   Problem: Metabolic: Goal: Ability to maintain appropriate glucose levels will improve Outcome: Progressing   Problem: Tissue Perfusion: Goal: Adequacy of tissue perfusion will improve Outcome: Progressing   Problem: Fluid Volume: Goal: Hemodynamic stability will improve Outcome: Progressing   Problem: Skin Integrity: Goal: Risk for impaired skin integrity will decrease Outcome: Progressing   Problem: Clinical Measurements: Goal: Diagnostic test results will improve Outcome: Progressing Goal: Signs and symptoms of infection will decrease Outcome: Progressing   Problem: Respiratory: Goal: Ability to maintain adequate ventilation will improve Outcome: Progressing   Problem: Education: Goal: Knowledge of General Education information will improve Description: Including pain rating scale, medication(s)/side effects and non-pharmacologic comfort measures Outcome: Progressing   Problem: Health Behavior/Discharge Planning: Goal: Ability to manage health-related needs will improve Outcome:  Progressing   Problem: Clinical Measurements: Goal: Ability to maintain clinical measurements within normal limits will improve Outcome: Progressing Goal: Will remain free from infection Outcome: Progressing Goal: Diagnostic test results will improve Outcome: Progressing Goal: Respiratory complications will improve Outcome: Progressing Goal: Cardiovascular complication will be avoided Outcome: Progressing   Problem: Activity: Goal: Risk for activity intolerance will decrease Outcome: Progressing   Problem: Nutrition: Goal: Adequate nutrition will be maintained Outcome: Progressing   Problem: Coping: Goal: Level of anxiety will decrease Outcome: Progressing   Problem: Elimination: Goal: Will not experience complications related to bowel motility Outcome: Progressing Goal: Will not experience complications related to urinary retention Outcome: Progressing   Problem: Pain Managment: Goal: General experience of comfort will improve and/or be controlled Outcome: Progressing   Problem: Safety: Goal: Ability to remain free from injury will improve Outcome: Progressing   Problem: Skin Integrity: Goal: Risk for impaired skin integrity will decrease Outcome: Progressing

## 2023-06-08 NOTE — Care Management Obs Status (Signed)
 MEDICARE OBSERVATION STATUS NOTIFICATION   Patient Details  Name: Laura Hatfield MRN: [id number] Date of Birth: 04-May-1940   Medicare Observation Status Notification Given:  Rudolph Cost, CMA 06/08/2023, 9:50 AM

## 2023-06-08 NOTE — TOC Progression Note (Addendum)
 Transition of Care [facility]) - Progression Note    Patient Details  Name: Laura Hatfield MRN: [id number] Date of Birth: 1940/07/13  Transition [facility]) CM/SW Contact  Crayton Docker, RN 06/08/2023, 12:19 PM  Clinical Narrative:     Alert received from Dr. Lydia Sams regarding SNF authorization status. CM follow up in [place]. [facility] is still pending, reference # [id number]. CM alert to Dr Lydia Sams regarding [facility] pending status. CM follow up call to Admissions, [employer], phone: [phone], regarding bed availability. No answer, CM left message for return call.  Call received from [place], Admissions, [employer]. Per Antony Baumgartner, SNF cannot accept patient today, can accept patient tomorrow. CM informed Antony Baumgartner of pending auth. Per Antony Baumgartner, verbalized understanding and agreement.   Expected Discharge Plan: Skilled Nursing Facility Barriers to Discharge: Continued Medical Work up, SNF Pending bed offer  Expected Discharge Plan and Services     Post Acute Care Choice: Skilled Nursing Facility Living arrangements for the past 2 months: Single Family Home                  Social Determinants of Health (SDOH) Interventions SDOH Screenings   Food Insecurity: No Food Insecurity (06/08/2023)  Housing: Low Risk  (06/08/2023)  Transportation Needs: No Transportation Needs (06/08/2023)  Utilities: Not At Risk (06/08/2023)  Alcohol Screen: Low Risk  (03/29/2018)  Financial Resource Strain: Low Risk  (03/20/2023)   [facility] from [facility]  Social Connections: Unknown (06/08/2023)  Tobacco Use: Low Risk  (06/08/2023)    Readmission Risk Interventions     No data to display

## 2023-06-09 DIAGNOSIS — N390 Urinary tract infection, site not specified: Secondary | ICD-10-CM | POA: Diagnosis not present

## 2023-06-09 DIAGNOSIS — A419 Sepsis, unspecified organism: Secondary | ICD-10-CM | POA: Diagnosis not present

## 2023-06-09 LAB — URINE CULTURE: Culture: 30000 — AB

## 2023-06-09 LAB — GLUCOSE, CAPILLARY
Glucose-Capillary: 163 mg/dL — ABNORMAL HIGH (ref 70–99)
Glucose-Capillary: 152 mg/dL — ABNORMAL HIGH (ref 70–99)
Glucose-Capillary: 193 mg/dL — ABNORMAL HIGH (ref 70–99)
Glucose-Capillary: 191 mg/dL — ABNORMAL HIGH (ref 70–99)
Glucose-Capillary: 149 mg/dL — ABNORMAL HIGH (ref 70–99)

## 2023-06-09 MED ORDER — CEFUROXIME AXETIL 250 MG PO TABS
250.0000 mg | ORAL_TABLET | Freq: Two times a day (BID) | ORAL | Status: AC
  Administered 2023-06-09 – 2023-06-11 (×4): 250 mg via ORAL
  Filled 2023-06-09 (×5): qty 1

## 2023-06-09 MED ORDER — SODIUM CHLORIDE 0.9 % IV BOLUS
250.0000 mL | Freq: Once | INTRAVENOUS | Status: AC
  Administered 2023-06-09: 250 mL via INTRAVENOUS

## 2023-06-09 MED ORDER — ENOXAPARIN SODIUM 30 MG/0.3ML IJ SOSY
30.0000 mg | PREFILLED_SYRINGE | INTRAMUSCULAR | Status: DC
  Administered 2023-06-09 – 2023-06-13 (×5): 30 mg via SUBCUTANEOUS
  Filled 2023-06-09 (×5): qty 0.3

## 2023-06-09 NOTE — Progress Notes (Signed)
 Physical Therapy Treatment Patient Details Name: Laura Hatfield MRN: [id number] DOB: 04/01/1940 Today's Date: 06/09/2023   History of Present Illness Pt is an 83 yo female that presented to the ED for a fall at home. Imaging showed "oblique mildly displaced midshaft L fibular fracture. Posterior malleolar fracture with widening of the anterior ankle mortise, small cortical fracture off the anterolateral corner of the distal tibia. Small cortical fracture off the medial talus subjacent to the medial malleolus. PMH of HLD, HTN, CAD, DM on insulin, TIA, chronic diarrhea.    PT Comments  Pt received upright in bed agreeable to PT/OT co-treat. Able to transfer to EoB as supervision and bed features but does rely on increased time and heavy rail use. Pt able to repeat she is NWB on her limb and relies on bed elevated and minA+2 to stand to RW. Orthostatics reassessed and negative overall with mobility. Easy SPT using RW and CGA+1 and minA on RW for sequencing to safely sit in recliner. Laborious and need for VC's for sequencing to complete with notable WOB upon sitting. Pt with LE's elevated and all needs in reach. D/c recs remain appropriate.   Orthostatics: Supine: 148/66 mm Hg (MAP 85), HR: 77 BPM Sitting: 135/86 mm Hg (MAP 101), HR: 79 BPM  Stand: 129/63 mm Hg (84), HR: 82 BPM    If plan is discharge home, recommend the following: Two people to help with walking and/or transfers;A lot of help with bathing/dressing/bathroom;Assist for transportation;Assistance with cooking/housework;Help with stairs or ramp for entrance;Direct supervision/assist for medications management   Can travel by private vehicle     No  Equipment Recommendations  Other (comment) (TBD)    Recommendations for Other Services       Precautions / Restrictions Precautions Precautions: Fall Recall of Precautions/Restrictions: Intact Restrictions Weight Bearing Restrictions Per Provider Order: Yes LLE Weight  Bearing Per Provider Order: Non weight bearing Other Position/Activity Restrictions: per secure chat, pt to be NWB     Mobility  Bed Mobility Overal bed mobility: Needs Assistance Bed Mobility: Supine to Sit     Supine to sit: HOB elevated, Used rails, Supervision       Patient Response: Cooperative  Transfers Overall transfer level: Needs assistance Equipment used: Rolling walker (2 wheels) Transfers: Sit to/from Stand, Bed to chair/wheelchair/BSC Sit to Stand: Min assist, +2 physical assistance, From elevated surface   Step pivot transfers: Min assist, Contact guard assist, +2 physical assistance       General transfer comment: 2 person assist for safety but realistically only relied on CGA to minA+1 for RW mgmt to transfer from bed to recliner.    Ambulation/Gait                   Stairs             Wheelchair Mobility     Tilt Bed Tilt Bed Patient Response: Cooperative  Modified Rankin (Stroke Patients Only)       Balance Overall balance assessment: Needs assistance Sitting-balance support: Feet supported, Single extremity supported Sitting balance-Leahy Scale: Fair     Standing balance support: Reliant on assistive device for balance, During functional activity, Bilateral upper extremity supported Standing balance-Leahy Scale: Poor Standing balance comment: Reliant on DME to uphold NWB status on the LLE                            Communication Communication Communication: No apparent difficulties  Cognition Arousal:  Alert Behavior During Therapy: WFL for tasks assessed/performed   PT - Cognitive impairments: No apparent impairments                         Following commands: Intact      Cueing Cueing Techniques: Verbal cues, Tactile cues  Exercises      General Comments General comments (skin integrity, edema, etc.): Splint intact pre/post session      Pertinent Vitals/Pain Pain Assessment Pain  Assessment: Faces Faces Pain Scale: Hurts a little bit Pain Location: L ankle Pain Descriptors / Indicators: Throbbing, Discomfort Pain Intervention(s): Monitored during session, Repositioned    Home Living                          Prior Function            PT Goals (current goals can now be found in the care plan section) Acute Rehab PT Goals Patient Stated Goal: to get back to PLOF PT Goal Formulation: With patient Time For Goal Achievement: 06/19/23 Potential to Achieve Goals: Good Progress towards PT goals: Progressing toward goals    Frequency    Min 3X/week      PT Plan      Co-evaluation PT/OT/SLP Co-Evaluation/Treatment: Yes Reason for Co-Treatment: For patient/therapist safety;To address functional/ADL transfers PT goals addressed during session: Mobility/safety with mobility OT goals addressed during session: ADL's and self-care      AM-PAC PT "6 Clicks" Mobility   Outcome Measure  Help needed turning from your back to your side while in a flat bed without using bedrails?: A Little Help needed moving from lying on your back to sitting on the side of a flat bed without using bedrails?: A Little Help needed moving to and from a bed to a chair (including a wheelchair)?: A Lot Help needed standing up from a chair using your arms (e.g., wheelchair or bedside chair)?: A Lot Help needed to walk in hospital room?: Total Help needed climbing 3-5 steps with a railing? : Total 6 Click Score: 12    End of Session Equipment Utilized During Treatment: Gait belt Activity Tolerance: Patient tolerated treatment well Patient left: in chair;with call bell/phone within reach;with chair alarm set;with nursing/sitter in room Nurse Communication: Mobility status PT Visit Diagnosis: Other abnormalities of gait and mobility (R26.89);Difficulty in walking, not elsewhere classified (R26.2);Muscle weakness (generalized) (M62.81);Pain Pain - Right/Left: Left Pain -  part of body: Ankle and joints of foot     Time: 8657-8469 PT Time Calculation (min) (ACUTE ONLY): 21 min  Charges:    $Therapeutic Activity: 8-22 mins PT General Charges $$ ACUTE PT VISIT: 1 Visit                    Marc Senior. Fairly IV, PT, DPT Physical Therapist- [facility]  06/09/2023, 11:37 AM

## 2023-06-09 NOTE — Progress Notes (Signed)
 PHARMACIST - PHYSICIAN COMMUNICATION  CONCERNING:  Enoxaparin (Lovenox) for DVT Prophylaxis    RECOMMENDATION: Patient was prescribed enoxaprin 40mg  q24 hours for VTE prophylaxis. Previous adjustment made to enoxaparin 0.5 mg/kg for BMI >30.   Filed Weights   06/04/23 1703 06/08/23 0041  Weight: 95.3 kg (210 lb) 95 kg (209 lb 7 oz)    Body mass index is 32.8 kg/m.  Estimated Creatinine Clearance: 29.2 mL/min (A) (by C-G formula based on SCr of 1.67 mg/dL (H)).   Patient is now candidate for enoxaparin 30mg  every 24 hours based on CrCl <10ml/min or Weight <45kg  DESCRIPTION: Pharmacy has adjusted enoxaparin dose per [facility] policy.  Patient is now receiving enoxaparin 30 mg every 24 hours    Alice Innocent, PharmD Clinical Pharmacist  06/09/2023 7:29 AM

## 2023-06-09 NOTE — Progress Notes (Signed)
 Occupational Therapy Treatment Patient Details Name: Jayd Forrey MRN: [id number] DOB: 05/29/1940 Today's Date: 06/09/2023   History of present illness Pt is an 83 yo female that presented to the ED for a fall at home. Imaging showed "oblique mildly displaced midshaft L fibular fracture. Posterior malleolar fracture with widening of the anterior ankle mortise, small cortical fracture off the anterolateral corner of the distal tibia. Small cortical fracture off the medial talus subjacent to the medial malleolus. PMH of HLD, HTN, CAD, DM on insulin, TIA, chronic diarrhea.   OT comments  Pt seen for OT treatment on this date. Upon arrival to room pt resting in supine with PT at bedside, agreeable to tx. Pt completed simulated toilet t/f from EOB to recliner with use of RW. Pt required MINA +1 and CGA +1, assistance for RW managment throughout. Pt tolerated orthostatic vitals being taken prior to t/f to recliner. No reports of dizziness during position changes. Pt retired in Medical illustrator with LE elevated, NT in room to assist pt in bathing. Pt making good progress toward goals, will continue to follow POC. Discharge recommendation remains appropriate.    Orthostatics: Supine: 148/66 mm Hg (MAP 85), HR: 77 BPM Sitting: 135/86 mm Hg (MAP 101), HR: 79 BPM  Stand: 129/63 mm Hg (84), HR: 82 BPM        If plan is discharge home, recommend the following:  A lot of help with walking and/or transfers;A lot of help with bathing/dressing/bathroom;Assistance with cooking/housework;Help with stairs or ramp for entrance   Equipment Recommendations  Other (comment)    Recommendations for Other Services      Precautions / Restrictions Precautions Precautions: Fall Recall of Precautions/Restrictions: Intact Restrictions Weight Bearing Restrictions Per Provider Order: Yes LLE Weight Bearing Per Provider Order: Non weight bearing       Mobility Bed Mobility Overal bed mobility: Needs  Assistance Bed Mobility: Supine to Sit     Supine to sit: Contact guard, Used rails     General bed mobility comments: Verbal cues for technique/hand placement    Transfers Overall transfer level: Needs assistance Equipment used: Rolling walker (2 wheels) Transfers: Sit to/from Stand, Bed to chair/wheelchair/BSC Sit to Stand: Min assist, +2 physical assistance, From elevated surface     Step pivot transfers: Min assist, Contact guard assist, +2 physical assistance     General transfer comment: Pt completed simulated toilet t/f from EOB to recliner with use of RW. Pt required MINA +1 and CGA +1, assistance for RW managment throughout.     Balance Overall balance assessment: Needs assistance Sitting-balance support: Feet supported, Single extremity supported Sitting balance-Leahy Scale: Fair     Standing balance support: Reliant on assistive device for balance, During functional activity, Bilateral upper extremity supported Standing balance-Leahy Scale: Poor Standing balance comment: Reliant on DME to uphold NWB status on the LLE                           ADL either performed or assessed with clinical judgement   ADL Overall ADL's : Needs assistance/impaired                     Lower Body Dressing: Maximal assistance;Bed level   Toilet Transfer: Contact guard assist;Rolling walker (2 wheels);Minimal assistance;Cueing for sequencing;Ambulation;BSC/3in1 (Simulated t/f to reciner from EOB)           Functional mobility during ADLs: Minimal assistance;Contact guard assist;Rolling walker (2 wheels) General ADL Comments: Pt  completed simulated toilet t/f from EOB to recliner with use of RW. Pt required MINA +1  and CGA +1, assistance for RW managment throughout.    Extremity/Trunk Assessment              Occupational psychologist Communication: No apparent difficulties   Cognition Arousal:  Alert Behavior During Therapy: WFL for tasks assessed/performed Cognition: No apparent impairments             OT - Cognition Comments: A/Ox4                 Following commands: Intact        Cueing   Cueing Techniques: Verbal cues, Tactile cues  Exercises Exercises: Other exercises Other Exercises Other Exercises: Edu: handplacement and DME management throughout    Shoulder Instructions       General Comments Splint intact pre/post session    Pertinent Vitals/ Pain       Pain Assessment Pain Assessment: Faces Faces Pain Scale: Hurts little more Pain Location: L ankle Pain Intervention(s): Repositioned, Limited activity within patient's tolerance, Monitored during session  Home Living                                          Prior Functioning/Environment              Frequency  Min 2X/week        Progress Toward Goals  OT Goals(current goals can now be found in the care plan section)  Progress towards OT goals: Progressing toward goals  Acute Rehab OT Goals Patient Stated Goal: improve function OT Goal Formulation: With patient Time For Goal Achievement: 06/19/23 Potential to Achieve Goals: Good ADL Goals Pt Will Perform Grooming: with modified independence;sitting Pt Will Perform Lower Body Dressing: with modified independence;sitting/lateral leans;sit to/from stand Pt Will Transfer to Toilet: with modified independence;stand pivot transfer Pt Will Perform Toileting - Clothing Manipulation and hygiene: with modified independence;sit to/from stand;sitting/lateral leans  Plan      Co-evaluation    PT/OT/SLP Co-Evaluation/Treatment: Yes Reason for Co-Treatment: For patient/therapist safety;To address functional/ADL transfers PT goals addressed during session: Mobility/safety with mobility OT goals addressed during session: ADL's and self-care      AM-PAC OT "6 Clicks" Daily Activity     Outcome Measure   Help from  another person eating meals?: None Help from another person taking care of personal grooming?: A Little Help from another person toileting, which includes using toliet, bedpan, or urinal?: A Lot Help from another person bathing (including washing, rinsing, drying)?: A Lot Help from another person to put on and taking off regular upper body clothing?: None Help from another person to put on and taking off regular lower body clothing?: A Lot 6 Click Score: 17    End of Session Equipment Utilized During Treatment: Gait belt;Rolling walker (2 wheels)  OT Visit Diagnosis: Muscle weakness (generalized) (M62.81);Other abnormalities of gait and mobility (R26.89)   Activity Tolerance Patient tolerated treatment well   Patient Left in chair;with call bell/phone within reach;with chair alarm set;with nursing/sitter in room   Nurse Communication Mobility status        Time: 1001-1019 OT Time Calculation (min): 18 min  Charges: OT General Charges $OT Visit: 1 Visit OT Treatments $Self Care/Home Management : 8-22 mins  Adelina Mings  Regina Coppolino M.S. OTR/L  06/09/23, 12:41 PM

## 2023-06-09 NOTE — TOC Progression Note (Addendum)
 Transition of Care [facility]) - Progression Note    Patient Details  Name: Laura Hatfield MRN: [id number] Date of Birth: November 20, 1940  [facility] CM/SW Contact  Crayton Docker, RN Phone Number: 06/09/2023, 12:57 PM  Clinical Narrative:      Alert received per Cristal Don, LCSW regarding auth declined due to patient's blood pressure low during therapy assessments. Alert received per Dr Lydia Sams regarding PT assessment and evaluation today.  Noted, insurance auth appeal call back to Rozelle Corning, phone: [phone],  reference #: [id number]. CM alert to Dr. Lydia Sams regarding blood pressure during physical therapy.   CM call to [employer], phone; [phone] regarding appeal for denied auth. CM spoke to [place]. Per Trenia Fritter, case # is [id number]. Per [place], request for supporting documentation faxed to [phone].   CM faxed supporting documentation to [facility] Expedited Appeals. CM awaiting fax confirmation. Expected Discharge Plan: Skilled Nursing Facility Barriers to Discharge: Continued Medical Work up, SNF Pending bed offer  Expected Discharge Plan and Services     Post Acute Care Choice: Skilled Nursing Facility Living arrangements for the past 2 months: Single Family Home                                       Social Determinants of Health (SDOH) Interventions SDOH Screenings   Food Insecurity: No Food Insecurity (06/08/2023)  Housing: Low Risk  (06/08/2023)  Transportation Needs: No Transportation Needs (06/08/2023)  Utilities: Not At Risk (06/08/2023)  Alcohol Screen: Low Risk  (03/29/2018)  Financial Resource Strain: Low Risk  (03/20/2023)   Received from [facility]  Social Connections: Unknown (06/08/2023)  Tobacco Use: Low Risk  (06/08/2023)    Readmission Risk Interventions     No data to display

## 2023-06-09 NOTE — Progress Notes (Signed)
 [facility]  - [place] at [facility]   PATIENT NAME: Laura Hatfield    MR#:  [id number]  DATE OF BIRTH:  Jan 28, 1941  SUBJECTIVE:  no family at bedside. Patient overall doing well. Work with PT without significant drop in blood pressure no fever. Tolerating diet well.  VITALS:  Blood pressure (!) 132/58, pulse 79, temperature 99 F (37.2 C), resp. rate 15, height 5\' 7"  (1.702 m), weight 95 kg, SpO2 97%.  PHYSICAL EXAMINATION:   GENERAL:  83 y.o.-year-old patient with no acute distress. Obese LUNGS: Normal breath sounds bilaterally,  CARDIOVASCULAR: S1, S2 normal. No murmur   ABDOMEN: Soft, nontender, nondistended. EXTREMITIES: left LE splint +   NEUROLOGIC: nonfocal  patient is alert and awake   LABORATORY PANEL:  CBC Recent Labs  Lab 06/08/23 0218  WBC 16.3*  HGB 10.7*  HCT 33.2*  PLT 282    Chemistries  Recent Labs  Lab 06/05/23 1006 06/06/23 1732 06/08/23 0218  NA 139   < > 138  K 3.9   < > 4.0  CL 107   < > 107  CO2 25   < > 24  GLUCOSE 125*   < > 217*  BUN 30*   < > 26*  CREATININE 1.88*   < > 1.67*  CALCIUM 8.9   < > 8.9  AST 29  --   --   ALT 20  --   --   ALKPHOS 74  --   --   BILITOT 0.7  --   --    < > = values in this interval not displayed.   Assessment and Plan Laura Hatfield is a 83 y.o. female with medical history significant for CAD, HTN,   CKD lllb and diabetes being admitted for sepsis secondary to UTI, after being boarded in the ED for several days awaiting disposition following an accidental left ankle fracture.  She was initially seen in the ED on 4/10, for the ankle fracture, seen by podiatry and splinted and was awaiting placement due to baseline need for walker for ambulation.  2 days ago she developed a fever and was treated empirically for UTI with Rocephin transition to oral antibiotics however, today she again spiked a fever and met sepsis criteria thus the reason for request for admission.  Urinalysis showed  large leuks and rare bacteria   Sepsis secondary to UTI [facility]) --Sepsis criteria: Fever, tachycardia and infection --Rocephin and IV fluids --Urine cultures from 4/12-- multiple species --UC from 4/13 30K yeast --WBC trending dow --will switch to oral antibiotic for 5 days -- Overall appears stable   Stage 3b chronic kidney disease (HCC) --At baseline   Diabetes (HCC) --Continue basal insulin with sliding scale coverage   CAD (coronary artery disease) --Continue home meds   Closed left ankle fracture --Splinted on 4/11 --Outpatient podiatry follow-up with Dr Rosemarie Conquest   patient seen by PT OT. Did well without significant drop in blood pressure. Patient is medically best at baseline for discharge TOC to work for discharge planning.   DVT prophylaxis: Lovenox   Consults: none   Advance Care Planning:   Code Status: Prior    Family Communication: none   Disposition Plan: [facility] tomorrow Level of care: Telemetry Medical Status is: Observation The patient remains OBS appropriate and will d/c before 2 midnights.    TOTAL TIME TAKING CARE OF THIS PATIENT:51minutes.  >50% time spent on counselling and coordination of care  Note: This dictation was prepared  with Dragon dictation along with smaller phrase technology. Any transcriptional errors that result from this process are unintentional.  [name] M.D    [facility]   CC: Primary care physician; Coral Der, MD

## 2023-06-09 NOTE — Plan of Care (Signed)
  Problem: Coping: Goal: Ability to adjust to condition or change in health will improve Outcome: Progressing   Problem: Fluid Volume: Goal: Ability to maintain a balanced intake and output will improve Outcome: Progressing   Problem: Nutritional: Goal: Maintenance of adequate nutrition will improve Outcome: Progressing

## 2023-06-10 DIAGNOSIS — A419 Sepsis, unspecified organism: Secondary | ICD-10-CM | POA: Diagnosis not present

## 2023-06-10 DIAGNOSIS — N390 Urinary tract infection, site not specified: Secondary | ICD-10-CM | POA: Diagnosis not present

## 2023-06-10 LAB — GLUCOSE, CAPILLARY
Glucose-Capillary: 116 mg/dL — ABNORMAL HIGH (ref 70–99)
Glucose-Capillary: 161 mg/dL — ABNORMAL HIGH (ref 70–99)
Glucose-Capillary: 210 mg/dL — ABNORMAL HIGH (ref 70–99)
Glucose-Capillary: 200 mg/dL — ABNORMAL HIGH (ref 70–99)

## 2023-06-10 MED ORDER — BISACODYL 10 MG RE SUPP: 10.0000 mg | Freq: Once | RECTAL | Status: DC

## 2023-06-10 MED ORDER — ALUM & MAG HYDROXIDE-SIMETH 200-200-20 MG/5ML PO SUSP
30.0000 mL | Freq: Four times a day (QID) | ORAL | Status: DC | PRN
  Administered 2023-06-10 – 2023-06-14 (×2): 30 mL via ORAL
  Filled 2023-06-10 (×2): qty 30

## 2023-06-10 MED ORDER — BISACODYL 10 MG RE SUPP: 10.0000 mg | Freq: Every day | RECTAL | Status: DC

## 2023-06-10 NOTE — Progress Notes (Signed)
 [facility]  - [place] at [facility]   PATIENT NAME: Laura Hatfield    MR#:  [id number]  DATE OF BIRTH:  12-Jul-1940  SUBJECTIVE:  no family at bedside. Patient overall doing well. no fever. Tolerating diet well.denies any dizziness   VITALS:  Blood pressure 134/67, pulse 87, temperature 98.8 F (37.1 C), resp. rate 16, height 5\' 7"  (1.702 m), weight 95 kg, SpO2 91%.  PHYSICAL EXAMINATION:   GENERAL:  83 y.o.-year-old patient with no acute distress. Obese LUNGS: Normal breath sounds bilaterally,  CARDIOVASCULAR: S1, S2 normal. No murmur   ABDOMEN: Soft, nontender, nondistended. EXTREMITIES: left LE splint +   NEUROLOGIC: nonfocal  patient is alert and awake   LABORATORY PANEL:  CBC Recent Labs  Lab 06/08/23 0218  WBC 16.3*  HGB 10.7*  HCT 33.2*  PLT 282    Chemistries  Recent Labs  Lab 06/05/23 1006 06/06/23 1732 06/08/23 0218  NA 139   < > 138  K 3.9   < > 4.0  CL 107   < > 107  CO2 25   < > 24  GLUCOSE 125*   < > 217*  BUN 30*   < > 26*  CREATININE 1.88*   < > 1.67*  CALCIUM 8.9   < > 8.9  AST 29  --   --   ALT 20  --   --   ALKPHOS 74  --   --   BILITOT 0.7  --   --    < > = values in this interval not displayed.   Assessment and Plan Laura Hatfield is a 83 y.o. female with medical history significant for CAD, HTN,   CKD lllb and diabetes being admitted for sepsis secondary to UTI, after being boarded in the ED for several days awaiting disposition following an accidental left ankle fracture.  She was initially seen in the ED on 4/10, for the ankle fracture, seen by podiatry and splinted and was awaiting placement due to baseline need for walker for ambulation.  2 days ago she developed a fever and was treated empirically for UTI with Rocephin transition to oral antibiotics however, today she again spiked a fever and met sepsis criteria thus the reason for request for admission.  Urinalysis showed large leuks and rare bacteria    Sepsis secondary to UTI [facility]) --Sepsis criteria: Fever, tachycardia and infection --Rocephin and IV fluids --Urine cultures from 4/12-- multiple species --UC from 4/13 30K yeast --WBC trending dow --will switch to oral antibiotic for 5 days -- Overall appears stable   Stage 3b chronic kidney disease (HCC) --At baseline   Diabetes (HCC) --Continue basal insulin with sliding scale coverage   CAD (coronary artery disease) --Continue home meds   Closed left ankle fracture --Splinted on 4/11 --Outpatient podiatry follow-up with Dr Rosemarie Conquest    Patient is medically best at baseline for discharge TOC to work for discharge planning.   DVT prophylaxis: Lovenox   Consults: none   Advance Care Planning:   Code Status: Prior    Family Communication: none   Disposition Plan: [facility] Level of care: Telemetry Medical Status is: Observation The patient remains OBS appropriate and will d/c before 2 midnights.   Awaiting for Appeal process per [facility]  TOTAL TIME TAKING CARE OF THIS PATIENT:35 minutes.  >50% time spent on counselling and coordination of care  Note: This dictation was prepared with Dragon dictation along with smaller phrase technology. Any transcriptional errors that  result from this process are unintentional.  [name] M.D    [facility]   CC: Primary care physician; Coral Der, MD

## 2023-06-10 NOTE — TOC Progression Note (Addendum)
 Transition of Care [facility]) - Progression Note    Patient Details  Name: Laura Hatfield MRN: [id number] Date of Birth: 1940-03-09  Transition of Care [facility]) CM/SW Contact  Crayton Docker, RN 06/10/2023, 10:31 AM  Clinical Narrative:     Call received from Kaitlyn, [employer] requesting clinicals   from 06/06/2023 through 06/08/2023 faxed to [phone]. Per Kailyn, will also need appointment representative letter signed and faxed back to [facility].   CM faxed clinicals for 06/06/2023 through 06/08/2023 to [facility] [employer]. CM awaiting fax confirmation.    AOR received via email from [employer]. Signed AOR per patient and RN CM faxed to [place], fax [phone]. CM will continue to follow. Patient verbalized readiness to discharge to SNF for rehab.  Expected Discharge Plan: Skilled Nursing Facility Barriers to Discharge: Continued Medical Work up, SNF Pending bed offer  Expected Discharge Plan and Services     Post Acute Care Choice: Skilled Nursing Facility Living arrangements for the past 2 months: Single Family Home       Social Determinants of Health (SDOH) Interventions SDOH Screenings   Food Insecurity: No Food Insecurity (06/08/2023)  Housing: Low Risk  (06/08/2023)  Transportation Needs: No Transportation Needs (06/08/2023)  Utilities: Not At Risk (06/08/2023)  Alcohol Screen: Low Risk  (03/29/2018)  Financial Resource Strain: Low Risk  (03/20/2023)   Received from [facility]  Social Connections: Unknown (06/08/2023)  Tobacco Use: Low Risk  (06/08/2023)    Readmission Risk Interventions     No data to display

## 2023-06-11 DIAGNOSIS — A419 Sepsis, unspecified organism: Secondary | ICD-10-CM | POA: Diagnosis not present

## 2023-06-11 DIAGNOSIS — N390 Urinary tract infection, site not specified: Secondary | ICD-10-CM | POA: Diagnosis not present

## 2023-06-11 LAB — GLUCOSE, CAPILLARY
Glucose-Capillary: 132 mg/dL — ABNORMAL HIGH (ref 70–99)
Glucose-Capillary: 188 mg/dL — ABNORMAL HIGH (ref 70–99)
Glucose-Capillary: 110 mg/dL — ABNORMAL HIGH (ref 70–99)
Glucose-Capillary: 182 mg/dL — ABNORMAL HIGH (ref 70–99)

## 2023-06-11 MED ORDER — INSULIN GLARGINE-YFGN 100 UNIT/ML ~~LOC~~ SOLN
12.0000 [IU] | Freq: Every day | SUBCUTANEOUS | Status: DC
  Administered 2023-06-11 – 2023-06-14 (×4): 12 [IU] via SUBCUTANEOUS
  Filled 2023-06-11 (×5): qty 0.12

## 2023-06-11 NOTE — Progress Notes (Signed)
 Occupational Therapy Treatment Patient Details Name: Laura Hatfield MRN: [id number] DOB: June 21, 1940 Today's Date: 06/11/2023   History of present illness Pt is an 83 yo female that presented to the ED for a fall at home. Imaging showed "oblique mildly displaced midshaft L fibular fracture. Posterior malleolar fracture with widening of the anterior ankle mortise, small cortical fracture off the anterolateral corner of the distal tibia. Small cortical fracture off the medial talus subjacent to the medial malleolus. PMH of HLD, HTN, CAD, DM on insulin, TIA, chronic diarrhea.   OT comments  Pt seen for OT treatment on this date. Upon arrival to room pt seated in recliner, agreeable to tx. Pt requires MAXA for stand pivot transfer to [facility], step by step verbal/tactile cues for sequencing transfer steps. Pt required standing rest breaks throughout SPT. Pt reports feeling weaker this time of day than the transfer she completed earlier with PT. Pt visibly fatigued post transfer, deferred further mobility due to fatigue. Pt set up with lunch in recliner at the end of session. All needs in reach. Pt required increased time for  Pt making fair progress toward goals, will continue to follow POC. Discharge recommendation remains appropriate.        If plan is discharge home, recommend the following:  A lot of help with walking and/or transfers;A lot of help with bathing/dressing/bathroom;Assistance with cooking/housework;Help with stairs or ramp for entrance   Equipment Recommendations  Other (comment)    Recommendations for Other Services      Precautions / Restrictions Precautions Precautions: Fall Recall of Precautions/Restrictions: Intact Restrictions Weight Bearing Restrictions Per Provider Order: Yes LLE Weight Bearing Per Provider Order: Non weight bearing Other Position/Activity Restrictions: per secure chat, pt to be NWB       Mobility Bed Mobility               General bed  mobility comments: NT in recliner pre/post session    Transfers Overall transfer level: Needs assistance Equipment used: Rolling walker (2 wheels) Transfers: Bed to chair/wheelchair/BSC, Sit to/from Stand Sit to Stand: Max assist Stand pivot transfers: Max assist         General transfer comment: Stable standing but reliant on Bil UE support from RW to remain up right     Balance Overall balance assessment: Needs assistance Sitting-balance support: Feet supported, Single extremity supported Sitting balance-Leahy Scale: Good     Standing balance support: Reliant on assistive device for balance, During functional activity, Bilateral upper extremity supported Standing balance-Leahy Scale: Poor Standing balance comment: Reliant on DME to uphold NWB status on the LLE                           ADL either performed or assessed with clinical judgement   ADL Overall ADL's : Needs assistance/impaired Eating/Feeding: Sitting;Modified independent   Grooming: Wash/dry face;Sitting;Set up               Lower Body Dressing: Minimal assistance (Donning sock shoes on LLE)   Toilet Transfer: Rolling walker (2 wheels);BSC/3in1;Stand-pivot;Cueing for sequencing;Cueing for safety;Maximal assistance   Toileting- Clothing Manipulation and Hygiene: Supervision/safety;Sitting/lateral lean       Functional mobility during ADLs: Rolling walker (2 wheels);Maximal assistance;Cueing for sequencing;Cueing for safety General ADL Comments: Tf to toilet via RW + MAXA    Extremity/Trunk Assessment     Lower Extremity Assessment LLE Deficits / Details: LLE in splint, externally rotated        Vision  Perception     Praxis     Communication Communication Communication: No apparent difficulties   Cognition Arousal: Alert Behavior During Therapy: WFL for tasks assessed/performed Cognition: No apparent impairments             OT - Cognition Comments: A/Ox4                  Following commands: Intact        Cueing   Cueing Techniques: Verbal cues, Tactile cues  Exercises Exercises: Other exercises Other Exercises Other Exercises: Edu: Sequencing throughout transfer    Shoulder Instructions       General Comments Splint intact pre/post session    Pertinent Vitals/ Pain       Pain Assessment Pain Assessment: 0-10 Pain Score: 5  Pain Location: L ankle Pain Descriptors / Indicators: Discomfort, Pressure, Burning Pain Intervention(s): Limited activity within patient's tolerance, Monitored during session  Home Living                                          Prior Functioning/Environment              Frequency  Min 2X/week        Progress Toward Goals  OT Goals(current goals can now be found in the care plan section)  Progress towards OT goals: Progressing toward goals  Acute Rehab OT Goals Patient Stated Goal: improve function OT Goal Formulation: With patient Time For Goal Achievement: 06/19/23 Potential to Achieve Goals: Good ADL Goals Pt Will Perform Grooming: with modified independence;sitting Pt Will Perform Lower Body Dressing: with modified independence;sitting/lateral leans;sit to/from stand Pt Will Transfer to Toilet: with modified independence;stand pivot transfer Pt Will Perform Toileting - Clothing Manipulation and hygiene: with modified independence;sit to/from stand;sitting/lateral leans  Plan      Co-evaluation                 AM-PAC OT "6 Clicks" Daily Activity     Outcome Measure   Help from another person eating meals?: None Help from another person taking care of personal grooming?: A Little Help from another person toileting, which includes using toliet, bedpan, or urinal?: A Lot Help from another person bathing (including washing, rinsing, drying)?: A Lot Help from another person to put on and taking off regular upper body clothing?: None Help from another  person to put on and taking off regular lower body clothing?: A Lot 6 Click Score: 17    End of Session Equipment Utilized During Treatment: Gait belt;Rolling walker (2 wheels)  OT Visit Diagnosis: Muscle weakness (generalized) (M62.81);Other abnormalities of gait and mobility (R26.89)   Activity Tolerance Patient tolerated treatment well   Patient Left in chair;with call bell/phone within reach;with chair alarm set   Nurse Communication Mobility status        Time: 1610-9604 OT Time Calculation (min): 20 min  Charges: OT General Charges $OT Visit: 1 Visit OT Treatments $Therapeutic Activity: 8-22 mins  Rosaria Common M.S. OTR/L  06/11/23, 1:06 PM

## 2023-06-11 NOTE — TOC Progression Note (Signed)
 Transition of Care [facility]) - Progression Note    Patient Details  Name: Laura Hatfield MRN: [id number] Date of Birth: Jul 18, 1940  Transition [facility] Care [facility]) CM/SW Contact  Crayton Docker, RN Phone Number: 06/11/2023, 12:27 PM  Clinical Narrative:     Voicemail message received from Kaitlyn, [place] [employer], all documents received and are actively reviewing case for auth approval. CM will continue to follow.   Expected Discharge Plan: Skilled Nursing Facility Barriers to Discharge: Continued Medical Work up, SNF Pending bed offer  Expected Discharge Plan and Services     Post Acute Care Choice: Skilled Nursing Facility Living arrangements for the past 2 months: Single Family Home                                       Social Determinants of Health (SDOH) Interventions SDOH Screenings   Food Insecurity: No Food Insecurity (06/08/2023)  Housing: Low Risk  (06/08/2023)  Transportation Needs: No Transportation Needs (06/08/2023)  Utilities: Not At Risk (06/08/2023)  Alcohol Screen: Low Risk  (03/29/2018)  Financial Resource Strain: Low Risk  (03/20/2023)   Received from [facility]  Social Connections: Unknown (06/08/2023)  Tobacco Use: Low Risk  (06/08/2023)    Readmission Risk Interventions     No data to display

## 2023-06-11 NOTE — Plan of Care (Signed)
   Problem: Coping: Goal: Ability to adjust to condition or change in health will improve Outcome: Progressing   Problem: Fluid Volume: Goal: Ability to maintain a balanced intake and output will improve Outcome: Progressing   Problem: Metabolic: Goal: Ability to maintain appropriate glucose levels will improve Outcome: Progressing

## 2023-06-11 NOTE — Progress Notes (Signed)
 Physical Therapy Treatment Patient Details Name: Laura Hatfield MRN: [id number] DOB: November 01, 1940 Today's Date: 06/11/2023   History of Present Illness Pt is an 83 yo female that presented to the ED for a fall at home. Imaging showed "oblique mildly displaced midshaft L fibular fracture. Posterior malleolar fracture with widening of the anterior ankle mortise, small cortical fracture off the anterolateral corner of the distal tibia. Small cortical fracture off the medial talus subjacent to the medial malleolus. PMH of HLD, HTN, CAD, DM on insulin, TIA, chronic diarrhea.    PT Comments  Pt received upright in bed agreeable to PT. Pt progressing well with PT without onset of dizziness throughout session. Also only needing 1 person assist today. Prior to mobility performing LLE therex maintaining NWB status in bed for warm up and LLE strengthening due to expected weakness/atrophy from NWB status. Pt transferring with bed features at supervision with increased time and effort to complete. Still reliant on modA+1 and bed elevated to STS from EOB but once standing is able to SPT CGA to recliner with excellent WB compliance and sequencing of RW. Pt sat in recliner needing VC's for hand placement with descent and reports no increased WoB post transfer. LE's elevated with LLE further elevated via pillow in care of RN for vitals. Pt making good progress towards therapy goals but still reliant on heavy one person assist for STS's and unable to progress hop-to gait currently. D/c recs remain appropriate.    If plan is discharge home, recommend the following: A lot of help with bathing/dressing/bathroom;A lot of help with walking and/or transfers;Help with stairs or ramp for entrance   Can travel by private vehicle     No  Equipment Recommendations  Other (comment) (TBD)    Recommendations for Other Services       Precautions / Restrictions Precautions Precautions: Fall Recall of  Precautions/Restrictions: Intact Restrictions Weight Bearing Restrictions Per Provider Order: Yes LLE Weight Bearing Per Provider Order: Non weight bearing Other Position/Activity Restrictions: per secure chat, pt to be NWB     Mobility  Bed Mobility Overal bed mobility: Needs Assistance Bed Mobility: Supine to Sit     Supine to sit: Supervision, HOB elevated, Used rails     General bed mobility comments: increased time and difficulty scooting to EOB Patient Response: Cooperative  Transfers Overall transfer level: Needs assistance Equipment used: Rolling walker (2 wheels) Transfers: Bed to chair/wheelchair/BSC, Sit to/from Stand Sit to Stand: Mod assist, From elevated surface   Step pivot transfers: Contact guard assist       General transfer comment: Once standing, just required CGA. Able to pivot on RLE and sequence RW correctly maintaining NWB    Ambulation/Gait                   Stairs             Wheelchair Mobility     Tilt Bed Tilt Bed Patient Response: Cooperative  Modified Rankin (Stroke Patients Only)       Balance Overall balance assessment: Needs assistance Sitting-balance support: Feet supported, Single extremity supported Sitting balance-Leahy Scale: Fair     Standing balance support: Reliant on assistive device for balance, During functional activity, Bilateral upper extremity supported Standing balance-Leahy Scale: Poor Standing balance comment: Reliant on DME to uphold NWB status on the LLE  Communication Communication Communication: No apparent difficulties  Cognition Arousal: Alert Behavior During Therapy: WFL for tasks assessed/performed   PT - Cognitive impairments: No apparent impairments                         Following commands: Intact      Cueing Cueing Techniques: Verbal cues, Tactile cues  Exercises General Exercises - Lower Extremity Short Arc Quad: AROM,  Strengthening, Both, 10 reps, Supine Hip ABduction/ADduction: AROM, Strengthening, Left, 10 reps, Supine Straight Leg Raises: AAROM, Strengthening, Left, 10 reps, Supine    General Comments        Pertinent Vitals/Pain Pain Assessment Pain Assessment: Faces Faces Pain Scale: No hurt    Home Living                          Prior Function            PT Goals (current goals can now be found in the care plan section) Acute Rehab PT Goals Patient Stated Goal: to get back to PLOF PT Goal Formulation: With patient Time For Goal Achievement: 06/19/23 Potential to Achieve Goals: Good Progress towards PT goals: Progressing toward goals    Frequency    Min 2X/week      PT Plan      Co-evaluation              AM-PAC PT "6 Clicks" Mobility   Outcome Measure  Help needed turning from your back to your side while in a flat bed without using bedrails?: A Little Help needed moving from lying on your back to sitting on the side of a flat bed without using bedrails?: A Little Help needed moving to and from a bed to a chair (including a wheelchair)?: A Little Help needed standing up from a chair using your arms (e.g., wheelchair or bedside chair)?: A Lot Help needed to walk in hospital room?: Total Help needed climbing 3-5 steps with a railing? : Total 6 Click Score: 13    End of Session Equipment Utilized During Treatment: Gait belt Activity Tolerance: Patient tolerated treatment well Patient left: in chair;with call bell/phone within reach;with chair alarm set;with nursing/sitter in room Nurse Communication: Mobility status PT Visit Diagnosis: Other abnormalities of gait and mobility (R26.89);Difficulty in walking, not elsewhere classified (R26.2);Muscle weakness (generalized) (M62.81);Pain Pain - Right/Left: Left Pain - part of body: Ankle and joints of foot     Time: 5784-6962 PT Time Calculation (min) (ACUTE ONLY): 23 min  Charges:    $Therapeutic  Exercise: 8-22 mins $Therapeutic Activity: 8-22 mins PT General Charges $$ ACUTE PT VISIT: 1 Visit                    Marc Senior. Fairly IV, PT, DPT Physical Therapist- [place]  [facility]  06/11/2023, 10:08 AM

## 2023-06-11 NOTE — Progress Notes (Signed)
 [facility]  - [place] [facility]   PATIENT NAME: Laura Hatfield    MR#:  [id number]  DATE OF BIRTH:  1941-01-26  SUBJECTIVE:  no family at bedside. Patient overall doing well. no fever. Tolerating diet well.denies any dizziness  Working with PT BP stable  VITALS:  Blood pressure (!) 138/56, pulse 76, temperature 99 F (37.2 C), temperature source Oral, resp. rate 20, height 5\' 7"  (1.702 m), weight 95 kg, SpO2 97%.  PHYSICAL EXAMINATION:   GENERAL:  83 y.o.-year-old patient with no acute distress. Obese LUNGS: Normal breath sounds bilaterally,  CARDIOVASCULAR: S1, S2 normal. No murmur   ABDOMEN: Soft, nontender, nondistended. EXTREMITIES: left LE splint +   NEUROLOGIC: nonfocal  patient is alert and awake   LABORATORY PANEL:  CBC Recent Labs  Lab 06/08/23 0218  WBC 16.3*  HGB 10.7*  HCT 33.2*  PLT 282    Chemistries  Recent Labs  Lab 06/05/23 1006 06/06/23 1732 06/08/23 0218  NA 139   < > 138  K 3.9   < > 4.0  CL 107   < > 107  CO2 25   < > 24  GLUCOSE 125*   < > 217*  BUN 30*   < > 26*  CREATININE 1.88*   < > 1.67*  CALCIUM 8.9   < > 8.9  AST 29  --   --   ALT 20  --   --   ALKPHOS 74  --   --   BILITOT 0.7  --   --    < > = values in this interval not displayed.   Assessment and Plan Laura Hatfield is a 83 y.o. female with medical history significant for CAD, HTN,   CKD lllb and diabetes being admitted for sepsis secondary to UTI, after being boarded in the ED for several days awaiting disposition following an accidental left ankle fracture.  She was initially seen in the ED on 4/10, for the ankle fracture, seen by podiatry and splinted and was awaiting placement due to baseline need for walker for ambulation.  2 days ago she developed a fever and was treated empirically for UTI with Rocephin transition to oral antibiotics however, today she again spiked a fever and met sepsis criteria thus the reason for request for admission.   Urinalysis showed large leuks and rare bacteria   Sepsis secondary to UTI [facility]) --Sepsis criteria: Fever, tachycardia and infection --Rocephin and IV fluids --Urine cultures from 4/12-- multiple species --UC from 4/13 30K yeast --WBC trending dow --completed antibiotic for 5 days -- Overall appears stable--BP is ok   Stage 3b chronic kidney disease (HCC) --At baseline   Diabetes (HCC) --Continue basal insulin with sliding scale coverage   CAD (coronary artery disease) --Continue home meds   Closed left ankle fracture --Splinted on 4/11 --Outpatient podiatry follow-up with Dr Rosemarie Conquest    Patient is medically best at baseline for discharge TOC to work for discharge planning.   DVT prophylaxis: Lovenox   Consults: none   Advance Care Planning:   Code Status: Prior    Family Communication: none   Disposition Plan: to rehab--awaiting Appeal process Level of care: Telemetry Medical Status is: Observation The patient remains OBS appropriate and will d/c before 2 midnights.   Awaiting for Appeal process per [facility]  TOTAL TIME TAKING CARE OF THIS PATIENT:35 minutes.  >50% time spent on counselling and coordination of care  Note: This dictation was prepared with Dragon dictation along  with smaller phrase technology. Any transcriptional errors that result from this process are unintentional.  [name] M.D    [facility]   CC: Primary care physician; Coral Der, MD

## 2023-06-12 DIAGNOSIS — N1832 Chronic kidney disease, stage 3b: Secondary | ICD-10-CM | POA: Diagnosis not present

## 2023-06-12 DIAGNOSIS — A419 Sepsis, unspecified organism: Secondary | ICD-10-CM | POA: Diagnosis not present

## 2023-06-12 DIAGNOSIS — S82892A Other fracture of left lower leg, initial encounter for closed fracture: Secondary | ICD-10-CM | POA: Diagnosis not present

## 2023-06-12 DIAGNOSIS — I2510 Atherosclerotic heart disease of native coronary artery without angina pectoris: Secondary | ICD-10-CM | POA: Diagnosis not present

## 2023-06-12 LAB — GLUCOSE, CAPILLARY
Glucose-Capillary: 134 mg/dL — ABNORMAL HIGH (ref 70–99)
Glucose-Capillary: 174 mg/dL — ABNORMAL HIGH (ref 70–99)
Glucose-Capillary: 178 mg/dL — ABNORMAL HIGH (ref 70–99)
Glucose-Capillary: 215 mg/dL — ABNORMAL HIGH (ref 70–99)

## 2023-06-12 MED ORDER — ORAL CARE MOUTH RINSE: 15.0000 mL | OROMUCOSAL | Status: DC | PRN

## 2023-06-12 NOTE — Progress Notes (Signed)
 Physical Therapy Treatment Patient Details Name: Laura Hatfield MRN: [id number] DOB: 24-Aug-1940 Today's Date: 06/12/2023   History of Present Illness Pt is an 83 yo female that presented to the ED for a fall at home. Imaging showed "oblique mildly displaced midshaft L fibular fracture. Posterior malleolar fracture with widening of the anterior ankle mortise, small cortical fracture off the anterolateral corner of the distal tibia. Small cortical fracture off the medial talus subjacent to the medial malleolus. PMH of HLD, HTN, CAD, DM on insulin , TIA, chronic diarrhea.    PT Comments  Pt received upright in bed agreeable to PT services. Focus of session progressing functional mobility. Transfers to EoB at supervision with increased time. From bed surface pt is reliant on modA for STS but CGA using RW for SPT to recliner. X2 hop to trials performed 1x6' and then 1x4' with chair follow and seated rest b/t trials. Pt stands from recliner CGA due to arm rests and has laborious hop to trials but completes safely CGA and maintaining LLE NWB. Pt fatigues quickly only tolerating these two bouts then returns to supine in bed at supervision post second bout. Discussed chair dips to improve standing tolerance and prep for improved gait and possible stair mobility when appropriate. Pt positioned in bed with all needs in reach. Pt continues to be motivated and demo's ability to progress her functional mobility. D/c recs remain appropriate.    If plan is discharge home, recommend the following: A lot of help with bathing/dressing/bathroom;A lot of help with walking and/or transfers;Help with stairs or ramp for entrance   Can travel by private vehicle     No  Equipment Recommendations  Other (comment) (TBD)    Recommendations for Other Services       Precautions / Restrictions Precautions Precautions: Fall Recall of Precautions/Restrictions: Intact Restrictions Weight Bearing Restrictions Per  Provider Order: Yes LLE Weight Bearing Per Provider Order: Non weight bearing Other Position/Activity Restrictions: per secure chat, pt to be NWB     Mobility  Bed Mobility Overal bed mobility: Needs Assistance Bed Mobility: Supine to Sit     Supine to sit: Supervision, HOB elevated, Used rails       Patient Response: Cooperative  Transfers Overall transfer level: Needs assistance Equipment used: Rolling walker (2 wheels) Transfers: Sit to/from Stand Sit to Stand: Mod assist, From elevated surface   Step pivot transfers: Contact guard assist            Ambulation/Gait Ambulation/Gait assistance: Contact guard assist Gait Distance (Feet): 10 Feet (1x6', 1x4' with seated rest b/t bouts) Assistive device: Rolling walker (2 wheels) Gait Pattern/deviations:  (hop-to)       General Gait Details: 2 bouts hop to. Is laborious for pt but able to compelte maintaining NWB   Stairs             Wheelchair Mobility     Tilt Bed Tilt Bed Patient Response: Cooperative  Modified Rankin (Stroke Patients Only)       Balance Overall balance assessment: Needs assistance Sitting-balance support: Feet supported, Single extremity supported Sitting balance-Leahy Scale: Good     Standing balance support: Reliant on assistive device for balance, During functional activity, Bilateral upper extremity supported Standing balance-Leahy Scale: Poor Standing balance comment: Reliant on DME to uphold NWB status on the LLE                            Communication Communication Communication: No  apparent difficulties  Cognition Arousal: Alert Behavior During Therapy: WFL for tasks assessed/performed   PT - Cognitive impairments: No apparent impairments                         Following commands: Intact      Cueing Cueing Techniques: Verbal cues, Tactile cues  Exercises Other Exercises Other Exercises: discussed chair dips for UE strength     General Comments        Pertinent Vitals/Pain Pain Assessment Pain Assessment: Faces Faces Pain Scale: No hurt    Home Living                          Prior Function            PT Goals (current goals can now be found in the care plan section) Acute Rehab PT Goals Patient Stated Goal: to get back to PLOF PT Goal Formulation: With patient Time For Goal Achievement: 06/19/23 Potential to Achieve Goals: Good Progress towards PT goals: Progressing toward goals    Frequency    Min 2X/week      PT Plan      Co-evaluation              AM-PAC PT "6 Clicks" Mobility   Outcome Measure  Help needed turning from your back to your side while in a flat bed without using bedrails?: A Little Help needed moving from lying on your back to sitting on the side of a flat bed without using bedrails?: A Little Help needed moving to and from a bed to a chair (including a wheelchair)?: A Little Help needed standing up from a chair using your arms (e.g., wheelchair or bedside chair)?: A Little Help needed to walk in hospital room?: A Lot Help needed climbing 3-5 steps with a railing? : Total 6 Click Score: 15    End of Session Equipment Utilized During Treatment: Gait belt Activity Tolerance: Patient tolerated treatment well Patient left: in bed;with call bell/phone within reach Nurse Communication: Mobility status PT Visit Diagnosis: Other abnormalities of gait and mobility (R26.89);Difficulty in walking, not elsewhere classified (R26.2);Muscle weakness (generalized) (M62.81);Pain Pain - Right/Left: Left Pain - part of body: Ankle and joints of foot     Time: 1610-9604 PT Time Calculation (min) (ACUTE ONLY): 35 min  Charges:    $Gait Training: 23-37 mins PT General Charges $$ ACUTE PT VISIT: 1 Visit                     Marc Senior. Fairly IV, PT, DPT Physical Therapist- [facility]  06/12/2023, 3:03 PM

## 2023-06-12 NOTE — TOC Progression Note (Signed)
 Transition of Care [facility]) - Progression Note    Patient Details  Name: Laura Hatfield MRN: [id number] Date of Birth: June 17, 1940  [facility]) CM/SW Contact  Crayton Docker, RN 06/12/2023, 1:12 PM  Clinical Narrative:     CM follow up call placed [facility], phone: [phone] to check status of appeal. CM spoke to [place]. Per Danniel Duverney has until the end of today to file a decision. Per Danniel Duverney has CM contact number for response.   Expected Discharge Plan: Skilled Nursing Facility Barriers to Discharge: Continued Medical Work up, SNF Pending bed offer  Expected Discharge Plan and Services     Post Acute Care Choice: Skilled Nursing Facility Living arrangements for the past 2 months: Single Family Home                   Social Determinants of Health (SDOH) Interventions SDOH Screenings   Food Insecurity: No Food Insecurity (06/08/2023)  Housing: Low Risk  (06/08/2023)  Transportation Needs: No Transportation Needs (06/08/2023)  Utilities: Not At Risk (06/08/2023)  Alcohol Screen: Low Risk  (03/29/2018)  Financial Resource Strain: Low Risk  (03/20/2023)   Received from [facility]  Social Connections: Unknown (06/08/2023)  Tobacco Use: Low Risk  (06/08/2023)    Readmission Risk Interventions     No data to display

## 2023-06-12 NOTE — Progress Notes (Signed)
 [facility]  -  at [facility]   PATIENT NAME: Laura Hatfield    MR#:  [id number]  DATE OF BIRTH:  [birth year]  SUBJECTIVE:  no family at bedside. Patient overall doing well. no fever. Working with PT BP stable Pt wondering when she will discharge to rehab  VITALS:  Blood pressure 135/79, pulse 76, temperature 98.6 F (37 C), resp. rate 16, height 5\' 7"  (1.702 m), weight 95 kg, SpO2 96%.  PHYSICAL EXAMINATION:   GENERAL:  83 y.o.-year-old patient with no acute distress. Obese LUNGS: Normal breath sounds bilaterally,  CARDIOVASCULAR: S1, S2 normal. No murmur   ABDOMEN: Soft, nontender, nondistended. EXTREMITIES: left LE splint +   NEUROLOGIC: nonfocal  patient is alert and awake   LABORATORY PANEL:  CBC Recent Labs  Lab 06/08/23 0218  WBC 16.3*  HGB 10.7*  HCT 33.2*  PLT 282    Chemistries  Recent Labs  Lab 06/08/23 0218  NA 138  K 4.0  CL 107  CO2 24  GLUCOSE 217*  BUN 26*  CREATININE 1.67*  CALCIUM  8.9   Assessment and Plan Laura Hatfield is a 83 y.o. female with medical history significant for CAD, HTN,   CKD lllb and diabetes being admitted for sepsis secondary to UTI, after being boarded in the ED for several days awaiting disposition following an accidental left ankle fracture.  She was initially seen in the ED on 4/10, for the ankle fracture, seen by podiatry and splinted and was awaiting placement due to baseline need for walker for ambulation.  2 days ago she developed a fever and was treated empirically for UTI with Rocephin  transition to oral antibiotics however, today she again spiked a fever and met sepsis criteria thus the reason for request for admission.  Urinalysis showed large leuks and rare bacteria   Sepsis secondary to UTI [facility]) --Sepsis criteria: Fever, tachycardia and infection --Rocephin  and IV fluids --Urine cultures from 4/12-- multiple species --UC from 4/13 30K yeast --WBC trending dow --completed  antibiotic for 5 days -- Overall appears stable--BP is ok   Stage 3b chronic kidney disease (HCC) --At baseline   Diabetes (HCC) --Continue basal insulin  with sliding scale coverage   CAD (coronary artery disease) --Continue home meds   Closed left ankle fracture --Splinted on 4/11 --Outpatient podiatry follow-up with Dr Rosemarie Conquest    Patient is medically best at baseline for discharge  TOC--awaiting appeal result  DVT prophylaxis: Lovenox    Consults: none   Advance Care Planning:   Code Status: Prior    Family Communication: none   Disposition Plan: to rehab--awaiting Appeal process Level of care: Telemetry Medical Status is: Observation The patient remains OBS appropriate and will d/c before 2 midnights.   Awaiting for Appeal process per [facility]  TOTAL TIME TAKING CARE OF THIS PATIENT:35 minutes.  >50% time spent on counselling and coordination of care  Note: This dictation was prepared with Dragon dictation along with smaller phrase technology. Any transcriptional errors that result from this process are unintentional.  [name] M.D    [facility]   CC: Primary care physician; Coral Der, MD

## 2023-06-12 NOTE — Plan of Care (Signed)
  Problem: Coping: Goal: Ability to adjust to condition or change in health will improve Outcome: Progressing   Problem: Tissue Perfusion: Goal: Adequacy of tissue perfusion will improve Outcome: Progressing   Problem: Respiratory: Goal: Ability to maintain adequate ventilation will improve Outcome: Adequate for Discharge

## 2023-06-12 NOTE — Plan of Care (Signed)
  Problem: Fluid Volume: Goal: Ability to maintain a balanced intake and output will improve Outcome: Progressing   Problem: Nutritional: Goal: Maintenance of adequate nutrition will improve Outcome: Progressing   Problem: Clinical Measurements: Goal: Signs and symptoms of infection will decrease Outcome: Progressing

## 2023-06-13 DIAGNOSIS — I2510 Atherosclerotic heart disease of native coronary artery without angina pectoris: Secondary | ICD-10-CM | POA: Diagnosis not present

## 2023-06-13 DIAGNOSIS — S82892A Other fracture of left lower leg, initial encounter for closed fracture: Secondary | ICD-10-CM | POA: Diagnosis not present

## 2023-06-13 DIAGNOSIS — N1832 Chronic kidney disease, stage 3b: Secondary | ICD-10-CM | POA: Diagnosis not present

## 2023-06-13 DIAGNOSIS — A419 Sepsis, unspecified organism: Secondary | ICD-10-CM | POA: Diagnosis not present

## 2023-06-13 LAB — GLUCOSE, CAPILLARY
Glucose-Capillary: 119 mg/dL — ABNORMAL HIGH (ref 70–99)
Glucose-Capillary: 168 mg/dL — ABNORMAL HIGH (ref 70–99)
Glucose-Capillary: 185 mg/dL — ABNORMAL HIGH (ref 70–99)
Glucose-Capillary: 217 mg/dL — ABNORMAL HIGH (ref 70–99)
Glucose-Capillary: 172 mg/dL — ABNORMAL HIGH (ref 70–99)

## 2023-06-13 MED ORDER — MIDODRINE HCL 5 MG PO TABS
5.0000 mg | ORAL_TABLET | Freq: Three times a day (TID) | ORAL | Status: DC
  Administered 2023-06-13: 5 mg via ORAL
  Filled 2023-06-13: qty 1

## 2023-06-13 NOTE — Progress Notes (Signed)
 [facility]  - [place] at [facility]   PATIENT NAME: Laura Hatfield    MR#:  [id number]  DATE OF BIRTH:  [birth year]  SUBJECTIVE:  no family at bedside. Patient overall doing well. no fever. Working with PT BP stable   VITALS:  Blood pressure 136/61, pulse 75, temperature 99.3 F (37.4 C), resp. rate 18, height 5\' 7"  (1.702 m), weight 95 kg, SpO2 98%.  PHYSICAL EXAMINATION:   GENERAL:  83 y.o.-year-old patient with no acute distress. Obese LUNGS: Normal breath sounds bilaterally,  CARDIOVASCULAR: S1, S2 normal. No murmur   ABDOMEN: Soft, nontender, nondistended. EXTREMITIES: left LE splint +   NEUROLOGIC: nonfocal  patient is alert and awake   LABORATORY PANEL:  CBC Recent Labs  Lab 06/08/23 0218  WBC 16.3*  HGB 10.7*  HCT 33.2*  PLT 282    Chemistries  Recent Labs  Lab 06/08/23 0218  NA 138  K 4.0  CL 107  CO2 24  GLUCOSE 217*  BUN 26*  CREATININE 1.67*  CALCIUM  8.9   Assessment and Plan Gardenia Witter is a 83 y.o. female with medical history significant for CAD, HTN,   CKD lllb and diabetes being admitted for sepsis secondary to UTI, after being boarded in the ED for several days awaiting disposition following an accidental left ankle fracture.  She was initially seen in the ED on 4/10, for the ankle fracture, seen by podiatry and splinted and was awaiting placement due to baseline need for walker for ambulation.  2 days ago she developed a fever and was treated empirically for UTI with Rocephin  transition to oral antibiotics however, today she again spiked a fever and met sepsis criteria thus the reason for request for admission.  Urinalysis showed large leuks and rare bacteria   Sepsis secondary to UTI [facility]) --Sepsis criteria: Fever, tachycardia and infection --Rocephin  and IV fluids --Urine cultures from 4/12-- multiple species --UC from 4/13 30K yeast --WBC trending dow --completed antibiotic for 5 days -- Overall appears  stable--BP is ok   Stage 3b chronic kidney disease (HCC) --At baseline   Diabetes (HCC) --Continue basal insulin  with sliding scale coverage   CAD (coronary artery disease) --Continue home meds   Closed left ankle fracture --Splinted on 4/11 --Outpatient podiatry follow-up with Dr Rosemarie Conquest    Patient is medically best at baseline for discharge  TOC--awaiting appeal result  DVT prophylaxis: Lovenox    Consults: none   Advance Care Planning:   Code Status: Prior    Family Communication: none   Disposition Plan: to rehab--awaiting Appeal process Level of care: Telemetry Medical Status is: Observation The patient remains OBS appropriate and will d/c before 2 midnights.   Awaiting for Appeal process per [facility]  TOTAL TIME TAKING CARE OF THIS PATIENT:35 minutes.  >50% time spent on counselling and coordination of care  Note: This dictation was prepared with Dragon dictation along with smaller phrase technology. Any transcriptional errors that result from this process are unintentional.  [name] M.D    [facility]   CC: Primary care physician; Coral Der, MD

## 2023-06-14 DIAGNOSIS — S82892A Other fracture of left lower leg, initial encounter for closed fracture: Secondary | ICD-10-CM | POA: Diagnosis not present

## 2023-06-14 DIAGNOSIS — A419 Sepsis, unspecified organism: Secondary | ICD-10-CM | POA: Diagnosis not present

## 2023-06-14 DIAGNOSIS — N1832 Chronic kidney disease, stage 3b: Secondary | ICD-10-CM | POA: Diagnosis not present

## 2023-06-14 DIAGNOSIS — I2510 Atherosclerotic heart disease of native coronary artery without angina pectoris: Secondary | ICD-10-CM | POA: Diagnosis not present

## 2023-06-14 LAB — CREATININE, SERUM
Creatinine, Ser: 1.46 mg/dL — ABNORMAL HIGH (ref 0.44–1.00)
GFR, Estimated: 35 mL/min — ABNORMAL LOW (ref >60)

## 2023-06-14 LAB — GLUCOSE, CAPILLARY
Glucose-Capillary: 131 mg/dL — ABNORMAL HIGH (ref 70–99)
Glucose-Capillary: 168 mg/dL — ABNORMAL HIGH (ref 70–99)
Glucose-Capillary: 193 mg/dL — ABNORMAL HIGH (ref 70–99)
Glucose-Capillary: 198 mg/dL — ABNORMAL HIGH (ref 70–99)

## 2023-06-14 MED ORDER — ENOXAPARIN SODIUM 40 MG/0.4ML IJ SOSY
40.0000 mg | PREFILLED_SYRINGE | INTRAMUSCULAR | Status: DC
Start: 2023-06-14 — End: 2023-06-15
  Administered 2023-06-14: 40 mg via SUBCUTANEOUS
  Filled 2023-06-14: qty 0.4

## 2023-06-14 NOTE — Progress Notes (Signed)
 PHARMACIST - PHYSICIAN COMMUNICATION  CONCERNING:  Enoxaparin  (Lovenox ) for DVT Prophylaxis    RECOMMENDATION: Patient was prescribed enoxaprin 30mg  q24 hours for VTE prophylaxis for CrCl 30  Filed Weights   06/04/23 1703 06/08/23 0041  Weight: 95.3 kg (210 lb) 95 kg (209 lb 7 oz)    Body mass index is 32.8 kg/m.  Estimated Creatinine Clearance: 33.4 mL/min (A) (by C-G formula based on SCr of 1.46 mg/dL (H)).   Based on [facility] policy patient is candidate for enoxaparin  40mg  every 24 hours based on CrCl >30.   DESCRIPTION: Pharmacy has adjusted enoxaparin  dose per [facility] policy.  Patient is now receiving enoxaparin  40 mg every 24 hours    Malone Sear, PharmD, BCPS Clinical Pharmacist 06/14/2023 10:45 AM

## 2023-06-14 NOTE — Progress Notes (Signed)
 [facility]  - [place] at [facility]   PATIENT NAME: Laura Hatfield    MR#:  [id number]  DATE OF BIRTH:  25-Feb-1940  SUBJECTIVE:  no family at bedside. Patient overall doing well. no fever. Working with PT BP stable today Had a large BM x2 and felt flushed after 2nd BM    VITALS:  Blood pressure 135/67, pulse 72, temperature 98.9 F (37.2 C), resp. rate 18, height 5\' 7"  (1.702 m), weight 95 kg, SpO2 96%.  PHYSICAL EXAMINATION:   GENERAL:  83 y.o.-year-old patient with no acute distress. Obese LUNGS: Normal breath sounds bilaterally,  CARDIOVASCULAR: S1, S2 normal.  ABDOMEN: Soft, nontender EXTREMITIES: left LE splint +   NEUROLOGIC: nonfocal  patient is alert and awake   LABORATORY PANEL:  CBC Recent Labs  Lab 06/08/23 0218  WBC 16.3*  HGB 10.7*  HCT 33.2*  PLT 282    Chemistries  Recent Labs  Lab 06/08/23 0218 06/14/23 0357  NA 138  --   K 4.0  --   CL 107  --   CO2 24  --   GLUCOSE 217*  --   BUN 26*  --   CREATININE 1.67* 1.46*  CALCIUM  8.9  --    Assessment and Plan Laura Hatfield is a 83 y.o. female with medical history significant for CAD, HTN,   CKD lllb and diabetes being admitted for sepsis secondary to UTI, after being boarded in the ED for several days awaiting disposition following an accidental left ankle fracture.  She was initially seen in the ED on 4/10, for the ankle fracture, seen by podiatry and splinted and was awaiting placement due to baseline need for walker for ambulation.  2 days ago she developed a fever and was treated empirically for UTI with Rocephin  transition to oral antibiotics however, today she again spiked a fever and met sepsis criteria thus the reason for request for admission.  Urinalysis showed large leuks and rare bacteria   Sepsis secondary to UTI [facility]) --Sepsis criteria: Fever, tachycardia and infection --Rocephin  and IV fluids --Urine cultures from 4/12-- multiple species --UC from 4/13 30K  yeast --WBC trending dow --completed antibiotic for 5 days -- Overall appears stable--BP is ok   Stage 3b chronic kidney disease (HCC) --At baseline   Diabetes (HCC) --Continue basal insulin  with sliding scale coverage   CAD (coronary artery disease) --Continue home meds   Closed left ankle fracture --Splinted on 4/11 --Outpatient podiatry follow-up with Dr Rosemarie Conquest    Patient is medically best at baseline for discharge  TOC--awaiting appeal result  DVT prophylaxis: Lovenox    Consults: none   Advance Care Planning:   Code Status: Prior    Family Communication: none   Disposition Plan: to rehab--awaiting Appeal process Level of care: Telemetry Medical Status is: Observation The patient remains OBS appropriate and will d/c before 2 midnights.   Awaiting for Appeal process per [facility]  TOTAL TIME TAKING CARE OF THIS PATIENT:35 minutes.  >50% time spent on counselling and coordination of care  Note: This dictation was prepared with Dragon dictation along with smaller phrase technology. Any transcriptional errors that result from this process are unintentional.  [name] M.D    [facility]   CC: Primary care physician; Coral Der, MD

## 2023-06-14 NOTE — Plan of Care (Signed)
  Problem: Coping: Goal: Ability to adjust to condition or change in health will improve Outcome: Progressing   Problem: Skin Integrity: Goal: Risk for impaired skin integrity will decrease Outcome: Progressing   Problem: Clinical Measurements: Goal: Signs and symptoms of infection will decrease Outcome: Progressing

## 2023-06-15 DIAGNOSIS — A419 Sepsis, unspecified organism: Secondary | ICD-10-CM | POA: Diagnosis not present

## 2023-06-15 DIAGNOSIS — N390 Urinary tract infection, site not specified: Secondary | ICD-10-CM | POA: Diagnosis not present

## 2023-06-15 LAB — GLUCOSE, CAPILLARY
Glucose-Capillary: 126 mg/dL — ABNORMAL HIGH (ref 70–99)
Glucose-Capillary: 182 mg/dL — ABNORMAL HIGH (ref 70–99)

## 2023-06-15 MED ORDER — POLYETHYLENE GLYCOL 3350 17 G PO PACK: 17.0000 g | PACK | Freq: Every day | ORAL | Status: AC

## 2023-06-15 MED ORDER — INSULIN GLARGINE-YFGN 100 UNIT/ML ~~LOC~~ SOLN: 12.0000 [IU] | Freq: Every day | SUBCUTANEOUS | Status: DC

## 2023-06-15 MED ORDER — COLESTIPOL HCL 1 G PO TABS: 2.0000 g | ORAL_TABLET | Freq: Two times a day (BID) | ORAL | Status: DC | PRN

## 2023-06-15 NOTE — Plan of Care (Signed)
  Problem: Education: Goal: Ability to describe self-care measures that may prevent or decrease complications (Diabetes Survival Skills Education) will improve Outcome: Progressing Goal: Individualized Educational Video(s) Outcome: Not Applicable   Problem: Coping: Goal: Ability to adjust to condition or change in health will improve Outcome: Progressing   Problem: Fluid Volume: Goal: Ability to maintain a balanced intake and output will improve Outcome: Progressing   Problem: Health Behavior/Discharge Planning: Goal: Ability to identify and utilize available resources and services will improve Outcome: Progressing Goal: Ability to manage health-related needs will improve Outcome: Progressing   Problem: Metabolic: Goal: Ability to maintain appropriate glucose levels will improve Outcome: Progressing   Problem: Nutritional: Goal: Maintenance of adequate nutrition will improve Outcome: Progressing Goal: Progress toward achieving an optimal weight will improve Outcome: Progressing   Problem: Skin Integrity: Goal: Risk for impaired skin integrity will decrease Outcome: Progressing   Problem: Tissue Perfusion: Goal: Adequacy of tissue perfusion will improve Outcome: Progressing   Problem: Fluid Volume: Goal: Hemodynamic stability will improve Outcome: Progressing   Problem: Clinical Measurements: Goal: Diagnostic test results will improve Outcome: Progressing Goal: Signs and symptoms of infection will decrease Outcome: Progressing   Problem: Respiratory: Goal: Ability to maintain adequate ventilation will improve Outcome: Progressing   Problem: Education: Goal: Knowledge of General Education information will improve Description: Including pain rating scale, medication(s)/side effects and non-pharmacologic comfort measures Outcome: Progressing   Problem: Health Behavior/Discharge Planning: Goal: Ability to manage health-related needs will improve Outcome:  Progressing   Problem: Clinical Measurements: Goal: Ability to maintain clinical measurements within normal limits will improve Outcome: Progressing Goal: Will remain free from infection Outcome: Progressing Goal: Diagnostic test results will improve Outcome: Progressing Goal: Respiratory complications will improve Outcome: Progressing Goal: Cardiovascular complication will be avoided Outcome: Progressing   Problem: Activity: Goal: Risk for activity intolerance will decrease Outcome: Progressing   Problem: Nutrition: Goal: Adequate nutrition will be maintained Outcome: Progressing   Problem: Coping: Goal: Level of anxiety will decrease Outcome: Progressing   Problem: Elimination: Goal: Will not experience complications related to bowel motility Outcome: Progressing Goal: Will not experience complications related to urinary retention Outcome: Progressing   Problem: Pain Managment: Goal: General experience of comfort will improve and/or be controlled Outcome: Progressing   Problem: Safety: Goal: Ability to remain free from injury will improve Outcome: Progressing

## 2023-06-15 NOTE — TOC Progression Note (Addendum)
 Transition of Care [facility]) - Progression Note    Patient Details  Name: Laura Hatfield MRN: [id number] Date of Birth: 11/25/1940  Transition of Care [facility]) CM/SW Contact  Crayton Docker, RN 06/15/2023, 9:35 AM  Clinical Narrative:     Voicemail message received from [facility] regarding auth approval for SNF. CM follow up call placed to [facility], phone; [phone]. CM spoke to Joe. Per Atha Lazar [id number] is approved for SNF. CM follow up call placed to [facility], Admissions, [employer]. Phone: [phone] regarding approved auth and possible admission to SNF today.  No answer, CM left message for return call.    CM follow up in [place] MUST for PASSR. PASSR is [id number]  CM call to Antony Baumgartner, Admissions, phone: [phone] regarding auth approval and patient admission to SNF today. Per Antony Baumgartner, patient can admit today and will need discharge summary as soon as possible. CM call to [employer], phone: [phone]. BLS transport scheduled for 1330 today. CM provided RN report number to LPN Katrina and LPN Ilda Malkin. CM alert to Dr. Gordy Lauber, regarding discharge summary request per SNF request.   Patient Room assignment at [facility] SNF is [id number].  Expected Discharge Plan: Skilled Nursing Facility Barriers to Discharge: Continued Medical Work up, SNF Pending bed offer  Expected Discharge Plan and Services     Post Acute Care Choice: Skilled Nursing Facility Living arrangements for the past 2 months: Single Family Home                   Social Determinants of Health (SDOH) Interventions SDOH Screenings   Food Insecurity: No Food Insecurity (06/08/2023)  Housing: Low Risk  (06/08/2023)  Transportation Needs: No Transportation Needs (06/08/2023)  Utilities: Not At Risk (06/08/2023)  Alcohol Screen: Low Risk  (03/29/2018)  Financial Resource Strain: Low Risk  (03/20/2023)   Received from [facility]  Social Connections: Unknown (06/08/2023)  Tobacco  Use: Low Risk  (06/08/2023)    Readmission Risk Interventions     No data to display

## 2023-06-15 NOTE — TOC Transition Note (Addendum)
 Transition of Care [facility]) - Discharge Note   Patient Details  Name: Laura Hatfield MRN: [id number] Date of Birth: 01/07/1941  Transition of Care [facility]) CM/SW Contact:  Crayton Docker, RN 06/15/2023, 11:21 AM   Clinical Narrative:      Patient to discharge to [facility] today at 1330 via [employer]. Patient aware and verbalized readiness to discharge to SNF.  Discharge orders noted. Discharge summary noted. CM faxed discharge orders, discharge summary and SNF transfer report to [facility] and [facility].   Final next level of care: Skilled Nursing Facility Barriers to Discharge: Continued Medical Work up, SNF Pending bed offer   Patient Goals and CMS Choice   CMS Medicare.gov Compare Post Acute Care list provided to:: Patient      Discharge Placement   SNF              Discharge Plan and Services Additional resources added to the After Visit Summary for       Post Acute Care Choice: Skilled Nursing Facility             Social Drivers of Health (SDOH) Interventions SDOH Screenings   Food Insecurity: No Food Insecurity (06/08/2023)  Housing: Low Risk  (06/08/2023)  Transportation Needs: No Transportation Needs (06/08/2023)  Utilities: Not At Risk (06/08/2023)  Alcohol Screen: Low Risk  (03/29/2018)  Financial Resource Strain: Low Risk  (03/20/2023)   [facility]  Social Connections: Unknown (06/08/2023)  Tobacco Use: Low Risk  (06/08/2023)     Readmission Risk Interventions     No data to display

## 2023-06-15 NOTE — Discharge Summary (Signed)
 Physician Discharge Summary   Laura Hatfield  female DOB: Oct 05, 1940  ZOX:[id number]  PCP: Coral Der, MD  Admit date: 06/04/2023 Discharge date: 06/15/2023  Admitted From: home Disposition:  SNF rehab CODE STATUS: Full code  Discharge Instructions     No wound care   Complete by: As directed       Hospital Course:  For full details, please see H&P, progress notes, consult notes and ancillary notes.  Briefly,  Laura Hatfield is a 83 y.o. female with medical history significant for CAD, HTN,   CKD lllb and diabetes being admitted for sepsis secondary to UTI, after being boarded in the ED for several days awaiting disposition following an accidental left ankle fracture.    She was initially seen in the ED on 4/10, for the ankle fracture, seen by podiatry and splinted and was awaiting placement.  Then she developed a fever and was treated empirically for UTI with Rocephin .   Sepsis secondary to UTI [facility]) --Sepsis criteria: Fever, tachycardia and UTI --Urine cultures from 4/12-- multiple species --UC from 4/13 30K yeast --pt received 3 days of ceftriaxone  f/b 3 days of cefuroxime .    Closed left ankle fracture --Splinted on 4/11 --nonweightbearing to left lower extremity  --outpatient f/u with podiatry Dr. Michalene Agee about 1 week after discharge.   Stage 3b chronic kidney disease (HCC) --At baseline   Diabetes (HCC) --A1c 6.6. --has been receiving glargine 12u nightly, which will be continued after discharge.   CAD (coronary artery disease) --Continue ASA    Discharge Diagnoses:  Principal Problem:   Sepsis secondary to UTI [facility]) Active Problems:   Stage 3b chronic kidney disease (HCC)   Diabetes (HCC)   CAD (coronary artery disease)   Closed left ankle fracture     Discharge Instructions:  Allergies as of 06/15/2023       Reactions   Atorvastatin Other (See Comments)   Dr told her taking these were affecting her liver Other Reaction(s):  Muscle Pain, Not available   Crestor  [rosuvastatin ]    Ezetimibe Other (See Comments)   arthralgia   Lipitor [atorvastatin Calcium ]    Lisinopril Cough   Losartan Other (See Comments)   Passing out/dropped B/P/dropped blood sugar   Penicillins Swelling   Ceftriaxone  08/14/22 w/o allergic reaction         Medication List     STOP taking these medications    NovoLIN 70/30 Kwikpen (70-30) 100 UNIT/ML KwikPen Generic drug: insulin  isophane & regular human KwikPen       TAKE these medications    aspirin  EC 81 MG tablet Take 81 mg by mouth daily.   cholecalciferol  1000 units tablet Commonly known as: VITAMIN D  Take 1,000 Units by mouth at bedtime.   colestipol  1 g tablet Commonly known as: COLESTID  Take 2 tablets (2 g total) by mouth 2 (two) times daily as needed. Home med. What changed:  when to take this reasons to take this additional instructions   insulin  glargine-yfgn 100 UNIT/ML injection Commonly known as: SEMGLEE  Inject 0.12 mLs (12 Units total) into the skin at bedtime.   polyethylene glycol 17 g packet Commonly known as: MIRALAX  / GLYCOLAX  Take 17 g by mouth daily. Start taking on: June 16, 2023         Contact information for follow-up providers     McDonald, Olive Better, DPM. Schedule an appointment as soon as possible for a visit in 1 week.   Specialty: Podiatry Why: For ER visit follow-up and  reevaluation. Contact information: [address] [place] [address] [phone]              Contact information for after-discharge care     Destination     [facility] Preferred SNF .   Service: Skilled Nursing Contact information: [address] [place]  [address] [phone]                     Allergies  Allergen Reactions   Atorvastatin Other (See Comments)    Dr told her taking these were affecting her liver  Other  Reaction(s): Muscle Pain, Not available   Crestor  [Rosuvastatin ]    Ezetimibe Other (See Comments)    arthralgia   Lipitor [Atorvastatin Calcium ]    Lisinopril Cough   Losartan Other (See Comments)    Passing out/dropped B/P/dropped blood sugar   Penicillins Swelling    Ceftriaxone  08/14/22 w/o allergic reaction       The results of significant diagnostics from this hospitalization (including imaging, microbiology, ancillary and laboratory) are listed below for reference.   Consultations:   Procedures/Studies: DG Chest Portable 1 View Result Date: 06/05/2023 CLINICAL DATA:  Cough.  Low back pain EXAM: PORTABLE CHEST 1 VIEW COMPARISON:  03/21/2021 chest CT FINDINGS: Chronic pleural based thickening at the right apex with scarring and volume loss also seen on prior CT. There is no edema, consolidation, effusion, or pneumothorax. Normal heart size and mediastinal contours allowing for leftward rotation. IMPRESSION: Stable compared to 2023.  No acute finding. Electronically Signed   By: Ronnette Coke M.D.   On: 06/05/2023 10:28   CT Ankle Left Wo Contrast Result Date: 06/04/2023 CLINICAL DATA:  Left ankle pain and difficulty weight-bearing after a fall the evening of 06/03/2023. EXAM: CT OF THE LEFT ANKLE WITHOUT CONTRAST TECHNIQUE: Multidetector CT imaging of the left ankle was performed according to the standard protocol. Multiplanar CT image reconstructions were also generated. RADIATION DOSE REDUCTION: This exam was performed according to the departmental dose-optimization program which includes automated exposure control, adjustment of the mA and/or kV according to patient size and/or use of iterative reconstruction technique. COMPARISON:  Plain films left foot and lower leg 06/04/2023. FINDINGS: Bones/Joint/Cartilage The patient has a fracture of the posterior malleolus. The fracture fragment demonstrates mild superior displacement. A cortical bone fragment off the anterolateral corner of  the distal tibia is also consistent with fracture. Finally, a thin cortical fragment off of the medial talus subjacent to the medial malleolus is also seen in consistent with fracture. No other fracture is identified. 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 Ligaments Suboptimally assessed by CT. Muscles and Tendons Tendons appeared intact. Visualized musculature demonstrates fatty atrophy. Soft tissues Soft tissue contusions are seen about the ankle. IMPRESSION: 1. Fracture of the posterior malleolus with mild superior displacement of the fracture fragment. 2. Small cortical fracture off the anterolateral corner of the distal tibia. 3. Small cortical fracture off the medial talus subjacent to the medial malleolus. 4. 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 5. 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 Electronically Signed   By: Etheleen Her M.D.   On: 06/04/2023 12:02   DG Foot Complete Left Result Date: 06/04/2023 CLINICAL DATA:  pain, possible deformity around the L ankle area. Fall. EXAM: LEFT FOOT - COMPLETE  3+ VIEW COMPARISON:  Foot series 07/05/2013.  Tibia and fibula series today. FINDINGS: Nondisplaced posterior malleolar fracture suspected. Widening of the anterior ankle mortise on the lateral view. No foot fracture seen. Degenerative changes in the midfoot and hindfoot. Pes planus deformity. Diffuse soft tissue swelling. Radiopaque foreign body noted within the plantar soft tissues overlying the calcaneus concerning for a needle fragment. IMPRESSION: Concern for nondisplaced posterior malleolar fracture. Widening of the anterior ankle mortise. Recommend for further evaluation with dedicated ankle series. Small linear radiopaque foreign body in the plantar soft tissues overlying the calcaneus  concerning for a needle fragment. Electronically Signed   By: Janeece Mechanic M.D.   On: 06/04/2023 11:09   DG Tibia/Fibula Left Result Date: 06/04/2023 CLINICAL DATA:  Pain, possible deformity around the left ankle. Is fall. EXAM: LEFT TIBIA AND FIBULA - 2 VIEW COMPARISON:  Foot series 07/05/2013 and today FINDINGS: Positioning limits study. There is an oblique fracture through the midshaft of the left fibula, mildly displaced. Fractures through the posterior malleolus in the distal tibia. Widening of the ankle mortise anteriorly. IMPRESSION: Oblique mildly displaced midshaft fibular fracture. Posterior malleolar fracture with widening of the anterior ankle mortise on the lateral view. Recommend dedicated ankle series. Electronically Signed   By: Janeece Mechanic M.D.   On: 06/04/2023 11:07      Labs: BNP (last 3 results) No results for input(s): "BNP" in the last 8760 hours. Basic Metabolic Panel: Recent Labs  Lab 06/14/23 0357  CREATININE 1.46*   Liver Function Tests: No results for input(s): "AST", "ALT", "ALKPHOS", "BILITOT", "PROT", "ALBUMIN" in the last 168 hours. No results for input(s): "LIPASE", "AMYLASE" in the last 168 hours. No results for input(s): "AMMONIA" in the last 168 hours. CBC: No results for input(s): "WBC", "NEUTROABS", "HGB", "HCT", "MCV", "PLT" in the last 168 hours. Cardiac Enzymes: No results for input(s): "CKTOTAL", "CKMB", "CKMBINDEX", "TROPONINI" in the last 168 hours. BNP: Invalid input(s): "POCBNP" CBG: Recent Labs  Lab 06/14/23 1219 06/14/23 1608 06/14/23 2135 06/15/23 0734 06/15/23 1128  GLUCAP 168* 193* 198* 126* 182*   D-Dimer No results for input(s): "DDIMER" in the last 72 hours. Hgb A1c No results for input(s): "HGBA1C" in the last 72 hours. Lipid Profile No results for input(s): "CHOL", "HDL", "LDLCALC", "TRIG", "CHOLHDL", "LDLDIRECT" in the last 72 hours. Thyroid function studies No results for input(s): "TSH", "T4TOTAL", "T3FREE",  "THYROIDAB" in the last 72 hours.  Invalid input(s): "FREET3" Anemia work up No results for input(s): "VITAMINB12", "FOLATE", "FERRITIN", "TIBC", "IRON", "RETICCTPCT" in the last 72 hours. Urinalysis    Component Value Date/Time   COLORURINE YELLOW (A) 06/07/2023 1609   APPEARANCEUR CLOUDY (A) 06/07/2023 1609   APPEARANCEUR CLOUDY 01/20/2014 1357   LABSPEC 1.012 06/07/2023 1609   LABSPEC 1.020 01/20/2014 1357   PHURINE 5.0 06/07/2023 1609   GLUCOSEU NEGATIVE 06/07/2023 1609   GLUCOSEU NEGATIVE 01/20/2014 1357   HGBUR SMALL (A) 06/07/2023 1609   BILIRUBINUR NEGATIVE 06/07/2023 1609   BILIRUBINUR NEGATIVE 01/20/2014 1357   KETONESUR NEGATIVE 06/07/2023 1609   PROTEINUR 30 (A) 06/07/2023 1609   NITRITE NEGATIVE 06/07/2023 1609   LEUKOCYTESUR LARGE (A) 06/07/2023 1609   LEUKOCYTESUR MODERATE 01/20/2014 1357   Sepsis Labs No results for input(s): "WBC" in the last 168 hours.  Invalid input(s): "PROCALCITONIN", "LACTICIDVEN" Microbiology Recent Results (from the past 240 hours)  Urine Culture     Status: Abnormal   Collection Time: 06/06/23  1:59 AM   Specimen: Urine, Clean Catch  Result Value Ref Range Status  Specimen Description   Final    URINE, CLEAN CATCH Performed at [facility], [address] [place], [place] [address]    Special Requests   Final    NONE Performed at [facility], [address] [place], [place] [address]    Culture MULTIPLE SPECIES PRESENT, SUGGEST RECOLLECTION (A)  Final   Report Status 06/08/2023 FINAL  Final  Urine Culture     Status: Abnormal   Collection Time: 06/07/23  4:09 PM   Specimen: Urine, Clean Catch  Result Value Ref Range Status   Specimen Description   Final    URINE, CLEAN CATCH Performed at [facility], [address] [place], [place] [address]    Special Requests   Final    NONE Performed at [facility], [address] [place], [place] [address]    Culture 30,000  COLONIES/mL YEAST (A)  Final   Report Status 06/09/2023 FINAL  Final  Resp panel by RT-PCR (RSV, Flu A&B, Covid) Anterior Nasal Swab     Status: None   Collection Time: 06/07/23  6:33 PM   Specimen: Anterior Nasal Swab  Result Value Ref Range Status   SARS Coronavirus 2 by RT PCR NEGATIVE NEGATIVE Final    Comment: (NOTE) SARS-CoV-2 target nucleic acids are NOT DETECTED.  The SARS-CoV-2 RNA is generally detectable in upper respiratory specimens during the acute phase of infection. The lowest concentration of SARS-CoV-2 viral copies this assay can detect is 138 copies/mL. A negative result does not preclude SARS-Cov-2 infection and should not be used as the sole basis for treatment or other patient management decisions. A negative result may occur with  improper specimen collection/handling, submission of specimen other than nasopharyngeal swab, presence of viral mutation(s) within the areas targeted by this assay, and inadequate number of viral copies(<138 copies/mL). A negative result must be combined with clinical observations, patient history, and epidemiological information. The expected result is Negative.  Fact Sheet for Patients:  BloggerCourse.com  Fact Sheet for Healthcare Providers:  SeriousBroker.it  This test is no t yet approved or cleared by the [place]  FDA and  has been authorized for detection and/or diagnosis of SARS-CoV-2 by FDA under an Emergency Use Authorization (EUA). This EUA will remain  in effect (meaning this test can be used) for the duration of the COVID-19 declaration under Section 564(b)(1) of the Act, 21 U.S.C.section 360bbb-3(b)(1), unless the authorization is terminated  or revoked sooner.       Influenza A by PCR NEGATIVE NEGATIVE Final   Influenza B by PCR NEGATIVE NEGATIVE Final    Comment: (NOTE) The Xpert Xpress SARS-CoV-2/FLU/RSV plus assay is intended as an aid in the diagnosis of  influenza from Nasopharyngeal swab specimens and should not be used as a sole basis for treatment. Nasal washings and aspirates are unacceptable for Xpert Xpress SARS-CoV-2/FLU/RSV testing.  Fact Sheet for Patients: BloggerCourse.com  Fact Sheet for Healthcare Providers: SeriousBroker.it  This test is not yet approved or cleared by the [place]  FDA and has been authorized for detection and/or diagnosis of SARS-CoV-2 by FDA under an Emergency Use Authorization (EUA). This EUA will remain in effect (meaning this test can be used) for the duration of the COVID-19 declaration under Section 564(b)(1) of the Act, 21 U.S.C. section 360bbb-3(b)(1), unless the authorization is terminated or revoked.     Resp Syncytial Virus by PCR NEGATIVE NEGATIVE Final    Comment: (NOTE) Fact Sheet for Patients: BloggerCourse.com  Fact Sheet for Healthcare Providers:  SeriousBroker.it  This test is not yet approved or cleared by the [place]  FDA and has been authorized for detection and/or diagnosis of SARS-CoV-2 by FDA under an Emergency Use Authorization (EUA). This EUA will remain in effect (meaning this test can be used) for the duration of the COVID-19 declaration under Section 564(b)(1) of the Act, 21 U.S.C. section 360bbb-3(b)(1), unless the authorization is terminated or revoked.  Performed at [facility], [address] [place], [place] [address]      Total time spend on discharging this patient, including the last patient exam, discussing the hospital stay, instructions for ongoing care as it relates to all pertinent caregivers, as well as preparing the medical discharge records, prescriptions, and/or referrals as applicable, is 35 minutes.    Garrison Kanner, MD  [facility] 06/15/2023, 11:36 AM

## 2023-07-01 ENCOUNTER — Ambulatory Visit: Admitting: Podiatry

## 2023-07-01 ENCOUNTER — Encounter: Payer: Self-pay | Admitting: Podiatry

## 2023-07-01 ENCOUNTER — Ambulatory Visit (INDEPENDENT_AMBULATORY_CARE_PROVIDER_SITE_OTHER)

## 2023-07-01 DIAGNOSIS — S82892A Other fracture of left lower leg, initial encounter for closed fracture: Secondary | ICD-10-CM | POA: Diagnosis not present

## 2023-07-01 DIAGNOSIS — S82892G Other fracture of left lower leg, subsequent encounter for closed fracture with delayed healing: Secondary | ICD-10-CM

## 2023-07-01 NOTE — Progress Notes (Signed)
  Subjective:  Patient ID: Laura Hatfield, female    DOB: 1940/11/04,  MRN: [id number]  Chief Complaint  Patient presents with   Fracture    "It's throbbing a little bit from moving around."     83 y.o. female presents with the above complaint. History confirmed with patient.  She present today as a new patient with follow-up from the emergency room on June 04, 2023.  She suffered a mechanical fall and an ankle fracture was noted.  She was placed into a splint and has been nonweightbearing at a rehab facility since the ER.  She notes that prior to the injury she had noticed worsening flattening of her arch and turning out of her foot that predated the fall  Objective:  Physical Exam: warm, good capillary refill, no trophic changes or ulcerative lesions, normal DP and PT pulses, and some peripheral neuropathy is noted, she has valgus and abductory deformity of the ankle and hindfoot and midfoot with the plantar medial callus over the talonavicular joint she has pain swelling and edema around the ankle joint with no lacerations or open fracture..  CT scan and tib-fib films from 06/04/2023 shows PER 4 fracture with small posterior malleolar fracture and Maisonneuve fracture proximal fibula   Radiographs: Multiple views x-ray of the left ankle: Films taken of the left ankle show no dislocation or major subluxation of the ankle joint, posterior malleolar fracture is in moderate alignment Assessment:   1. Closed fracture of left ankle, initial encounter      Plan:  Patient was evaluated and treated and all questions answered.  She presents today 1 month out from a closed left ankle fracture.  At this point I recommend continuing nonoperative treatment and nonweightbearing for 1 more month.  After that may begin to ambulate in a cam boot.  Posterior splint was removed today and it tall cam boot was dispensed.  Her foot deformity predates her injury and does not seem to be significantly  exacerbated by the injury as well currently.  I would recommend a new CT scan to reevaluate fracture healing in 1 month.  Order was placed for this and referral be placed to be done at [facility].  Return 1 week after CT scan to reevaluate.  We discussed if she does have functional mentation pain swelling or arthritic changes that are severe following this that surgical intervention still may be indicated, hopefully this point if alignment is maintained she may be able to ambulate in an [place]  or double upright AFO.  Return in about 5 weeks (around 08/05/2023) for follow up ankle fracture (new xrays left ankle).

## 2023-07-02 ENCOUNTER — Telehealth: Payer: Self-pay

## 2023-07-02 NOTE — Telephone Encounter (Signed)
 CT order, office note and demographics faxed to [facility] Phone [phone], fax [phone]

## 2023-07-02 NOTE — Telephone Encounter (Signed)
-----   Message from CMA Evie J sent at 07/02/2023  8:58 AM EDT ----- Thanks! ----- Message ----- From: Floyce Hutching, DPM Sent: 07/01/2023   9:30 AM EDT To: Graeme Lawn, CMA  CT scan ordered for [facility], she is at a facility [employer]) and will have to coordinate referral and scheduling.  Would like it to be done first week of June prior to my appointment with her

## 2023-08-05 ENCOUNTER — Ambulatory Visit: Admitting: Podiatry

## 2023-08-05 ENCOUNTER — Encounter: Payer: Self-pay | Admitting: Podiatry

## 2023-08-05 ENCOUNTER — Ambulatory Visit (INDEPENDENT_AMBULATORY_CARE_PROVIDER_SITE_OTHER)

## 2023-08-05 DIAGNOSIS — S82892A Other fracture of left lower leg, initial encounter for closed fracture: Secondary | ICD-10-CM

## 2023-08-05 NOTE — Patient Instructions (Addendum)
 Call to schedule your CT scan:   [facility] Address: [address] [place], [place] [address] Phone: [phone]

## 2023-08-09 ENCOUNTER — Encounter: Payer: Self-pay | Admitting: Podiatry

## 2023-08-09 NOTE — Progress Notes (Signed)
  Subjective:  Patient ID: Laura Hatfield, female    DOB: 15-Jan-1941,  MRN: [id number]  Chief Complaint  Patient presents with   Fracture    It feels pretty good.  I feel pain if I move around on it a lot.    83 y.o. female presents with the above complaint. History confirmed with patient.  She notes that pain has been fairly manageable for her.  Objective:  Physical Exam: warm, good capillary refill, no trophic changes or ulcerative lesions, normal DP and PT pulses, and some peripheral neuropathy is noted, she has valgus and abductory deformity of the ankle and hindfoot and midfoot with the plantar medial callus over the talonavicular joint no pain around the ankle joint today there is edema  CT scan and tib-fib films from 06/04/2023 shows PER 4 fracture with small posterior malleolar fracture and Maisonneuve fracture proximal fibula   Radiographs: Multiple views x-ray of the left ankle: F secondary bone callus formation is healing Assessment:   1. Closed fracture of left ankle, initial encounter      Plan:  Patient was evaluated and treated and all questions answered.  Has had some improvement.  I did recommend we evaluate with a CT scan, this was ordered from [facility], she will have it scheduled and follow-up with me in 6 weeks after the scan.  Can begin partial weightbearing in the boot now as well.  She will notify me if there is any worsening pain with this.  She states her foot was turned out prior to the fracture so the deformity is not new if we can get her foot into a very small position hopefully should be able to be a minimal ambulator on it.  Return in about 6 weeks (around 09/16/2023) for fracture follow up (new xrays).

## 2023-09-02 ENCOUNTER — Ambulatory Visit
Admission: RE | Admit: 2023-09-02 | Discharge: 2023-09-02 | Disposition: A | Discharge status: Home / Self Care | Source: Ambulatory Visit | Attending: Podiatry | Admitting: Podiatry

## 2023-09-02 DIAGNOSIS — S82892A Other fracture of left lower leg, initial encounter for closed fracture: Secondary | ICD-10-CM | POA: Diagnosis present

## 2023-09-14 ENCOUNTER — Ambulatory Visit: Admitting: Podiatry

## 2023-09-14 ENCOUNTER — Ambulatory Visit

## 2023-09-14 DIAGNOSIS — M79672 Pain in left foot: Secondary | ICD-10-CM

## 2023-09-14 DIAGNOSIS — S82892G Other fracture of left lower leg, subsequent encounter for closed fracture with delayed healing: Secondary | ICD-10-CM

## 2023-09-14 NOTE — Progress Notes (Signed)
  Subjective:  Patient ID: Laura Hatfield, female    DOB: 09/11/1940,  MRN: [id number]  Chief Complaint  Patient presents with   Fracture    Rm 7 Patient is here as a f/u left ankle fracture. CT w/o contrast on     83 y.o. female presents with the above complaint. History confirmed with patient.  She returns for follow-up no new pain or swelling she completed the CT scan  Objective:  Physical Exam: warm, good capillary refill, no trophic changes or ulcerative lesions, normal DP and PT pulses, and some peripheral neuropathy is noted, she has valgus and abductory deformity of the ankle and hindfoot and midfoot with the plantar medial callus over the talonavicular joint no pain around the ankle joint today there is edema  CT scan and tib-fib films from 06/04/2023 shows PER 4 fracture with small posterior malleolar fracture and Maisonneuve fracture proximal fibula  CT scan 09/02/2023 shows some interval bridging of posterior malleolar fracture, no change in alignment   Radiographs: Multiple views x-ray of the left ankle: F secondary bone callus formation is healing Assessment:   1. Closed fracture of left ankle with delayed healing, subsequent encounter      Plan:  Patient was evaluated and treated and all questions answered.  Continues to be pain-free, her CT scan does show some interval healing, the boot was rubbing a large callus after threatening the wound on the inside of the talonavicular joint I think with her stability and 3 months out from the injury now we can begin a trial of weightbearing with a supportive Tri-Lock style ankle brace and a supportive shoe which she has.  May need custom bracing AFO at some point pending her progress.  She will work with home PT on her ambulatory transition with a walker and supportive device.  May need to transition to outpatient therapy at some point if she is able.  Follow-up in 6 weeks for new x-rays.  No follow-ups on file.

## 2023-09-16 ENCOUNTER — Ambulatory Visit: Admitting: Podiatry

## 2023-09-21 ENCOUNTER — Emergency Department
Admission: EM | Admit: 2023-09-21 | Discharge: 2023-09-23 | Disposition: A | Discharge status: Home / Self Care | Attending: Emergency Medicine | Admitting: Emergency Medicine

## 2023-09-21 ENCOUNTER — Other Ambulatory Visit: Payer: Self-pay

## 2023-09-21 ENCOUNTER — Emergency Department

## 2023-09-21 DIAGNOSIS — D72829 Elevated white blood cell count, unspecified: Secondary | ICD-10-CM | POA: Diagnosis not present

## 2023-09-21 DIAGNOSIS — N289 Disorder of kidney and ureter, unspecified: Secondary | ICD-10-CM | POA: Diagnosis not present

## 2023-09-21 DIAGNOSIS — M6281 Muscle weakness (generalized): Secondary | ICD-10-CM | POA: Diagnosis not present

## 2023-09-21 DIAGNOSIS — R531 Weakness: Secondary | ICD-10-CM | POA: Diagnosis not present

## 2023-09-21 DIAGNOSIS — E119 Type 2 diabetes mellitus without complications: Secondary | ICD-10-CM | POA: Diagnosis not present

## 2023-09-21 DIAGNOSIS — Z794 Long term (current) use of insulin: Secondary | ICD-10-CM | POA: Insufficient documentation

## 2023-09-21 DIAGNOSIS — R5383 Other fatigue: Secondary | ICD-10-CM | POA: Insufficient documentation

## 2023-09-21 DIAGNOSIS — Z8673 Personal history of transient ischemic attack (TIA), and cerebral infarction without residual deficits: Secondary | ICD-10-CM | POA: Insufficient documentation

## 2023-09-21 LAB — COMPREHENSIVE METABOLIC PANEL WITH GFR
ALT: 27 U/L (ref 0–44)
AST: 64 U/L — ABNORMAL HIGH (ref 15–41)
Albumin: 3.7 g/dL (ref 3.5–5.0)
Alkaline Phosphatase: 76 U/L (ref 38–126)
Anion gap: 16 — ABNORMAL HIGH (ref 5–15)
BUN: 46 mg/dL — ABNORMAL HIGH (ref 8–23)
CO2: 21 mmol/L — ABNORMAL LOW (ref 22–32)
Calcium: 9.5 mg/dL (ref 8.9–10.3)
Chloride: 104 mmol/L (ref 98–111)
Creatinine, Ser: 1.97 mg/dL — ABNORMAL HIGH (ref 0.44–1.00)
GFR, Estimated: 24 mL/min — ABNORMAL LOW (ref >60)
Glucose, Bld: 132 mg/dL — ABNORMAL HIGH (ref 70–99)
Potassium: 4.2 mmol/L (ref 3.5–5.1)
Sodium: 141 mmol/L (ref 135–145)
Total Bilirubin: 1.1 mg/dL (ref 0.0–1.2)
Total Protein: 7.2 g/dL (ref 6.5–8.1)

## 2023-09-21 LAB — URINALYSIS, ROUTINE W REFLEX MICROSCOPIC
Bilirubin Urine: NEGATIVE
Glucose, UA: NEGATIVE mg/dL
Ketones, ur: 20 mg/dL — AB
Leukocytes,Ua: NEGATIVE
Nitrite: NEGATIVE
Protein, ur: 100 mg/dL — AB
Specific Gravity, Urine: 1.019 (ref 1.005–1.030)
pH: 5 (ref 5.0–8.0)

## 2023-09-21 LAB — CBC
HCT: 41.1 % (ref 36.0–46.0)
Hemoglobin: 13 g/dL (ref 12.0–15.0)
MCH: 28.3 pg (ref 26.0–34.0)
MCHC: 31.6 g/dL (ref 30.0–36.0)
MCV: 89.3 fL (ref 80.0–100.0)
Platelets: 412 K/uL — ABNORMAL HIGH (ref 150–400)
RBC: 4.6 MIL/uL (ref 3.87–5.11)
RDW: 15.5 % (ref 11.5–15.5)
WBC: 14.4 K/uL — ABNORMAL HIGH (ref 4.0–10.5)
nRBC: 0 % (ref 0.0–0.2)

## 2023-09-21 MED ORDER — SODIUM CHLORIDE 0.9 % IV BOLUS
500.0000 mL | Freq: Once | INTRAVENOUS | Status: AC
  Administered 2023-09-21: 500 mL via INTRAVENOUS

## 2023-09-21 NOTE — ED Provider Notes (Signed)
 [facility] Provider Note    Event Date/Time   First MD Initiated Contact with Patient 09/21/23 1130     (approximate)   History   Fall   HPI  Laura Hatfield is a 83 y.o. female history of TIA coronary disease recent close left ankle fracture  Patient reports that she has been at home rehabilitating from an ankle fracture couple months ago.  She has been feeling a bit fatigued and generally weak working with physical therapy reports an in-home aide and nursing, to help her.  Yesterday she was alone, she was in her chair and she felt fatigued was going to transfer to her wheelchair but was too tired to do so.  She reports that she did not fall, she is very clear that she did not fall.  Instead she got onto the floor and slept on top of a rug.  She laid there until her cleaning service found her this morning  Patient reports her back of her left head a little bit sore from sleeping on the floor.  But denies any neck pain did not fall or become injured.      Physical Exam   Triage Vital Signs: ED Triage Vitals  Encounter Vitals Group     BP 09/21/23 1134 (!) 148/80     Girls Systolic BP Percentile --      Girls Diastolic BP Percentile --      Boys Systolic BP Percentile --      Boys Diastolic BP Percentile --      Pulse Rate 09/21/23 1134 96     Resp 09/21/23 1134 17     Temp 09/21/23 1134 (!) 97.5 F (36.4 C)     Temp Source 09/21/23 1134 Oral     SpO2 09/21/23 1129 98 %     Weight 09/21/23 1137 207 lb 0.2 oz (93.9 kg)     Height 09/21/23 1137 5' 7 (1.702 m)     Head Circumference --      Peak Flow --      Pain Score 09/21/23 1138 0     Pain Loc --      Pain Education --      Exclude from Growth Chart --     Most recent vital signs: Vitals:   09/21/23 1500 09/21/23 1530  BP: (!) 139/113 126/73  Pulse: (!) 105 98  Resp: 16 15  Temp:    SpO2: 100% 98%     General: Awake, no distress.  CV:  Good peripheral perfusion.  Normal  tones and rate Resp:  Normal effort.  Lungs speak in full clear sentences.  No shortness of breath  Normocephalic atraumatic.  No cervical tenderness.  Moves extremities without noted deficit.  No obvious long bone injuries.  She does have a prefabricated ankle brace, she is able to wiggle her toes well they are warm and well-perfused.  She denies any injuries and reports that she lowered herself to the ground and intentionally laid down and slept on the mat as she was too weak to transfer back to her wheelchair.  Noted pressure wounds  Abd:  No distention.  Soft nontender nondistended throughout.  Patient reports she is not having abdominal pain no nausea or vomiting Other:     ED Results / Procedures / Treatments   Labs (all labs ordered are listed, but only abnormal results are displayed) Labs Reviewed  CBC - Abnormal; Notable for the following components:  Result Value   WBC 14.4 (*)    Platelets 412 (*)    All other components within normal limits  COMPREHENSIVE METABOLIC PANEL WITH GFR - Abnormal; Notable for the following components:   CO2 21 (*)    Glucose, Bld 132 (*)    BUN 46 (*)    Creatinine, Ser 1.97 (*)    AST 64 (*)    GFR, Estimated 24 (*)    Anion gap 16 (*)    All other components within normal limits  URINALYSIS, ROUTINE W REFLEX MICROSCOPIC   Leukocytosis noted.  Mild acute on chronic renal insufficiency.  Leukocytosis appears to be slightly chronic though the last time this did occur she was treated for urinary tract infection  EKG  Slight tremor but deemed normal sinus rhythm without any evidence of acute abnormality   RADIOLOGY  CT Head Wo Contrast Result Date: 09/21/2023 CLINICAL DATA:  Head trauma, intracranial venous injury suspected reports no fall, but also reports pain over L parietal region after being found on floor. no neck pains or concerns for fracture EXAM: CT HEAD WITHOUT CONTRAST TECHNIQUE: Contiguous axial images were obtained from  the base of the skull through the vertex without intravenous contrast. RADIATION DOSE REDUCTION: This exam was performed according to the departmental dose-optimization program which includes automated exposure control, adjustment of the mA and/or kV according to patient size and/or use of iterative reconstruction technique. COMPARISON:  CT of the head dated July 31, 2020. FINDINGS: Brain: There is moderate generalized cerebral and cerebellar volume loss present. There has been interval development of focal encephalomalacia changes within the right mid frontal lobe. There is diffuse cerebral white matter disease. There is no evidence of acute intracranial injury. There is no evidence of hemorrhage, mass or hydrocephalus. Vascular: Mild to moderate calcific atheromatous disease. Skull: Intact and unremarkable. Sinuses/Orbits: Clear paranasal sinuses. Status post bilateral lens replacement. Other: None. IMPRESSION: 1. 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 No apparent acute process. Electronically Signed   By: Evalene Coho M.D.   On: 09/21/2023 12:47      PROCEDURES:  Critical Care performed: Yes, see critical care procedure note(s)  Procedures   MEDICATIONS ORDERED IN ED: Medications  sodium chloride  0.9 % bolus 500 mL (500 mLs Intravenous New Bag/Given 09/21/23 1556)     IMPRESSION / MDM / ASSESSMENT AND PLAN / ED COURSE  I reviewed the triage vital signs and the nursing notes.                              Differential diagnosis includes, but is not limited to, possible as deconditioning generalized weakness, debility increasing need for ADLs.  Given her clinical history and she provide she is alert and well-oriented reports that she felt generally weak which has been the case for some time now, she was not strong enough to her chair from chair to wheelchair last night and instead lowered herself to the ground and slipped on a rug or mat  until cleaning service arrived.  She does report she has a life alert style monitor in his cell phone but did not have them on her  She is awake alert without obvious distress or extremis.  She is very pleasant.  Hemodynamics are reassuring.  Her labs do show leukocytosis.  Historically she did have urinary tract infection with sepsis on previous admission, awaiting urinalysis result.  Otherwise labs reassuring with only  mild evidence of slight acute on chronic renal insufficiency which we will provide hydration for.  Ongoing care assigned to Dr. Levander.  Awaiting urinalysis result.  I suspect 1 of 2 things if she has evidence of a urinary tract infection she may require admission due to generalized weakness, but also if she does not have evidence of infection reasonable to reevaluate her observe and consider getting TOC and reassessment with physical therapy and Occupational Therapy as it does seem that her ability perform ADLs is diminishing.  I do not think she is fully ready for discharge but will need a safe disposition plan in place without his admission or additional levels of care assistance at home is not yet clear to me  Patient's presentation is most consistent with acute complicated illness / injury requiring diagnostic workup.          FINAL CLINICAL IMPRESSION(S) / ED DIAGNOSES   Final diagnoses:  Generalized weakness     Rx / DC Orders   ED Discharge Orders     None        Note:  This document was prepared using Dragon voice recognition software and may include unintentional dictation errors.   Dicky Anes, MD 09/21/23 [phone]

## 2023-09-21 NOTE — ED Provider Notes (Signed)
 Care of this patient assumed from prior physician at 1530 pending urinalysis and disposition. Please see prior physician note for further details.  Briefly this is an 83 year old female who presented with weakness after not being able to get into her bed.  Labs with mild leukocytosis, mild AKI on CKD.  CT head without acute findings.  Pending urinalysis.  If this demonstrated UTI, felt patient was likely appropriate for admission.  If urinalysis unremarkable, signed out with plan to consult TOC given concerns for patient's ability to safely be at home.  Urinalysis resulted without evidence of infection.  Patient denying UTI symptoms.  Denies abdominal pain, cough, congestion, other complaints.  Patient agrees with concerns about her safety at home.  No clear indication for medical admission at this time.  She is agreeable to [facility] evaluation.  TOC consult placed.   Levander Slate, MD 09/21/23 [phone]

## 2023-09-21 NOTE — ED Notes (Signed)
 Staff assisted pt to toilet. Pt able to pivot from bed to toilet. Pt tolerated activity well. Pt back in bed. Bed is on lowest position, locked, and call bell is within reach.

## 2023-09-21 NOTE — ED Triage Notes (Signed)
 Pt arrived via [facility] from home found on the floor by cleaning services around 1030 am. Per EMS, no thinners, no pain, no injury. Pt is able to stand and pivot. Pt has hx of diabetes and stroke. Pt has a hx of a fracture of left foot for two months.

## 2023-09-22 LAB — CBG MONITORING, ED: Glucose-Capillary: 236 mg/dL — ABNORMAL HIGH (ref 70–99)

## 2023-09-22 MED ORDER — INSULIN GLARGINE-YFGN 100 UNIT/ML ~~LOC~~ SOLN
12.0000 [IU] | Freq: Every day | SUBCUTANEOUS | Status: DC
  Administered 2023-09-22: 12 [IU] via SUBCUTANEOUS
  Filled 2023-09-22 (×2): qty 0.12

## 2023-09-22 MED ORDER — ASPIRIN 81 MG PO TBEC
81.0000 mg | DELAYED_RELEASE_TABLET | Freq: Every day | ORAL | Status: DC
  Administered 2023-09-22 – 2023-09-23 (×2): 81 mg via ORAL
  Filled 2023-09-22 (×2): qty 1

## 2023-09-22 NOTE — ED Notes (Signed)
 Pt placed on bedpan, all bedding changed, pt placed in gown, bed bath given, pt boosted in bed. Pt states she's very comfortable with no current needs. Bed low and locked, call bell within reach.

## 2023-09-22 NOTE — ED Notes (Signed)
 Pt to bedside toilet at this time with x1 assist. Pt back in bed. Fall bundle in place. Call light within reach.

## 2023-09-22 NOTE — ED Provider Notes (Signed)
-----------------------------------------   9:17 AM on 09/22/2023 -----------------------------------------   Blood pressure (!) 143/62, pulse 87, temperature 97.7 F (36.5 C), temperature source Oral, resp. rate 19, height 5' 7 (1.702 m), weight 93.9 kg, SpO2 98%.  The patient is calm and cooperative at this time.  There have been no acute events since the last update.  Awaiting disposition plan from case management/social work.    Ceazia Harb, Josette SAILOR, DO 09/22/23 [phone]

## 2023-09-23 LAB — CBG MONITORING, ED: Glucose-Capillary: 159 mg/dL — ABNORMAL HIGH (ref 70–99)

## 2023-09-23 NOTE — ED Notes (Signed)
 Patient wheeled out to family that is picking up patient. Discharge instructions provided. Social work to call and set up home health with patient. Patient states she has wheelchair and walker already at home.

## 2023-09-23 NOTE — ED Notes (Addendum)
 Messaged Danielle, SW/TOC at this time. Pt has been here for approx 45 hours without a note from SW. This RN placed PT/OT orders that had not been previously ordered at this time.

## 2023-09-23 NOTE — TOC CM/SW Note (Signed)
 CSW acknowledges consult for SNF placement. Patient must be evaluated by PT and OT to be considered for SNF. They were consulted today. Will follow for recommendations.  Lauraine Carpen, CSW [phone]

## 2023-09-23 NOTE — ED Provider Notes (Signed)
-----------------------------------------   5:54 AM on 09/23/2023 -----------------------------------------   Blood pressure (!) 145/70, pulse 92, temperature 98.3 F (36.8 C), resp. rate 18, height 1.702 m (5' 7), weight 93.9 kg, SpO2 99%.  The patient is calm and cooperative at this time.  There have been no acute events since the last update.  Awaiting disposition plan from [facility] team.   Gordan Huxley, MD 09/23/23 [phone]

## 2023-09-23 NOTE — Progress Notes (Signed)
 Cm spoke with patient via phone about therapy recommendation for [facility]. Patient states she had HH with [facility] about a week ago. She was discharge from services. CM asked if she was in agreement with obtaining their services again. Patient states yes. Patient already have DME in the home. She states that family will be staying with her. Patient states that another family member will be coming on Thursday.  Cm spoke with Larraine at [facility]. They will verify her insurance and plan to see her within 48hrs.   Delphine Bring, RN CM

## 2023-09-23 NOTE — ED Notes (Addendum)
Breakfast meal tray given to pt at this time.

## 2023-09-23 NOTE — ED Notes (Signed)
 Provided SW pt's cell phone number at this time to speak with pt.

## 2023-09-23 NOTE — ED Provider Notes (Signed)
 Patient has been boarding in the ED for the past couple days.  Today has been seen by our case manager/social worker as well as PT/OT who recommends home health PT, which I have ordered.  She has DME at home from previous home with PT in the past couple weeks.  Patient has family who she can stay with, currently at the bedside here in the ED, and additional family is coming in in the next couple days to help out.  She is comfortable going home.  We discussed appropriate ED return precautions.  Patient suitable for outpatient management.   Laura Rover, MD 09/23/23 [phone]

## 2023-09-23 NOTE — ED Notes (Signed)
 PT/OT at bedside at this time.

## 2023-09-23 NOTE — Evaluation (Signed)
 Occupational Therapy Evaluation Patient Details Name: Laura Hatfield MRN: [id number] DOB: 1940-05-06 Today's Date: 09/23/2023   History of Present Illness   Pt is a 83 y.o. female brought to [facility] ED presenting with weakness after not being able to get into her bed. Labs with mild leukocytosis, mild AKI on CKD.  CT head without acute findings, urinalysis without evidence of infection. Pt with recent L ankle fx (April 2025), reports recently working with HHPT LLE PWB in soft ankle brace. PMH of HLD, HTN, CAD, DM on insulin , TIA, chronic diarrhea.     Clinical Impressions Pt seen for OT eval, pt pleasant and provides details leading up to arrival to [facility] ED. Per pt report, she was having a nightmare and trying to get into her wheelchair, but got on the floor instead and could not get up. Pt lives alone, has family that can provide 24/7 assist at discharge and was mod independent wheelchair level with BADLs. Prior to L ankle fx April 2025 she was ambulatory with RW, recently participating in HHPT to gain mobility at [facility] of LLE in soft ankle brace.   Pt requires MIN A for bed mobility and transfers, MAX A for ankle brace adjustment and donning sock, and completes mobility ~30 ft in room using RW and CGA. Pt would benefit from skilled OT services to address noted impairments and functional limitations (see below for any additional details) in order to maximize safety and independence while minimizing falls risk and caregiver burden. Anticipate the need for follow up [facility] OT services upon acute hospital DC.      If plan is discharge home, recommend the following:   A little help with walking and/or transfers;A little help with bathing/dressing/bathroom;Assist for transportation;Help with stairs or ramp for entrance     Functional Status Assessment   Patient has had a recent decline in their functional status and demonstrates the ability to make significant improvements in function in a  reasonable and predictable amount of time.     Equipment Recommendations   None recommended by OT      Precautions/Restrictions   Precautions Precautions: Fall Recall of Precautions/Restrictions: Intact Restrictions Weight Bearing Restrictions Per Provider Order: Yes LLE Weight Bearing Per Provider Order: Partial weight bearing Other Position/Activity Restrictions: patient has soft brace for L ankle.     Mobility Bed Mobility Overal bed mobility: Needs Assistance Bed Mobility: Supine to Sit, Sit to Supine     Supine to sit: Min assist, HOB elevated, Used rails Sit to supine: Min assist, Used rails   General bed mobility comments: pt requires assist due to hospital gurney setup    Transfers Overall transfer level: Needs assistance Equipment used: Rolling walker (2 wheels) Transfers: Sit to/from Stand Sit to Stand: Contact guard assist                  Balance Overall balance assessment: Needs assistance Sitting-balance support: Feet supported Sitting balance-Leahy Scale: Good     Standing balance support: Bilateral upper extremity supported, During functional activity, Reliant on assistive device for balance Standing balance-Leahy Scale: Good                             ADL either performed or assessed with clinical judgement   ADL Overall ADL's : Needs assistance/impaired                     Lower Body Dressing: Maximal assistance;Sit to/from stand  Lower Body Dressing Details (indicate cue type and reason): maxA to adjust L ankle soft brace and don sock Toilet Transfer: Ambulation;Rolling walker (2 wheels);Minimal assistance Toilet Transfer Details (indicate cue type and reason): SPT or ambulation to commode in ED room Toileting- Clothing Manipulation and Hygiene: Minimal assistance;Sitting/lateral lean       Functional mobility during ADLs: Contact guard assist;Rolling walker (2 wheels) General ADL Comments: CGA with RW short  distance ambulation in room, good adherence to PWB of LLE. LLE is kept externally rotated throughout     Vision Baseline Vision/History: 1 Wears glasses Ability to See in Adequate Light: 0 Adequate Patient Visual Report: No change from baseline              Pertinent Vitals/Pain Pain Assessment Pain Assessment: No/denies pain     Extremity/Trunk Assessment Upper Extremity Assessment Upper Extremity Assessment: Right hand dominant;RUE deficits/detail (mild swelling R hand/wrist, pt denies pain or injury)   Lower Extremity Assessment Lower Extremity Assessment: Generalized weakness LLE Deficits / Details: recent ankle fx. no significant pain reported. PWB. L LE externally rotated LLE Coordination: decreased gross motor   Cervical / Trunk Assessment Cervical / Trunk Assessment: Normal   Communication Communication Communication: No apparent difficulties   Cognition Arousal: Alert Behavior During Therapy: WFL for tasks assessed/performed Cognition: No apparent impairments                               Following commands: Intact       Cueing  General Comments   Cueing Techniques: Verbal cues  mild swelling R hand/wrist, pt denies pain or injury. LLE ankle brace removed and readjusted.   Exercises Exercises: Other exercises Other Exercises Other Exercises: edu patient for skin integrity and hygeine, pt reports brace and CAM boot donned for extended periods. pt with dry, flakey skin on foot / ankle and edema but no skin breakdown        Home Living Family/patient expects to be discharged to:: Private residence Living Arrangements: Alone Available Help at Discharge: Family;Available PRN/intermittently Type of Home: House Home Access: Stairs to enter [employer] of Steps: 7 Entrance Stairs-Rails: Right;Left;Can reach both Home Layout: One level         Firefighter: Standard     Home Equipment: Agricultural consultant (2 wheels);Toilet  riser;Wheelchair - manual;BSC/3in1;Grab bars - toilet;Grab bars - tub/shower;Hand held shower head   Additional Comments: pt states PT has educated her on safe entry to home in [place]      Prior Functioning/Environment Prior Level of Function : Independent/Modified Independent;History of Falls (last six months)             Mobility Comments: mobility with manual wc, has been ambulatory with RW in soft ankle brace with HHPT ADLs Comments: generally MOD I with ADL; intermittent assist for IADL, orders groceries, has a cleaning service    OT Problem List: Decreased strength;Decreased range of motion;Decreased activity tolerance;Impaired balance (sitting and/or standing)   OT Treatment/Interventions: Self-care/ADL training;Therapeutic exercise;Neuromuscular education;DME and/or AE instruction;Therapeutic activities;Patient/family education;Balance training      OT Goals(Current goals can be found in the care plan section)   Acute Rehab OT Goals OT Goal Formulation: With patient Time For Goal Achievement: 10/07/23 Potential to Achieve Goals: Good   OT Frequency:  Min 2X/week       AM-PAC OT 6 Clicks Daily Activity     Outcome Measure Help from another person eating meals?: None Help  from another person taking care of personal grooming?: None Help from another person toileting, which includes using toliet, bedpan, or urinal?: A Little Help from another person bathing (including washing, rinsing, drying)?: A Little Help from another person to put on and taking off regular upper body clothing?: None Help from another person to put on and taking off regular lower body clothing?: A Little 6 Click Score: 21   End of Session Equipment Utilized During Treatment: Rolling walker (2 wheels);Other (comment) (L ankle brace donned) Nurse Communication: Mobility status  Activity Tolerance: Patient tolerated treatment well Patient left: in bed;with call bell/phone within reach  OT Visit  Diagnosis: Unsteadiness on feet (R26.81);Other abnormalities of gait and mobility (R26.89);Muscle weakness (generalized) (M62.81)                Time: 8966-8943 OT Time Calculation (min): 23 min Charges:  OT General Charges $OT Visit: 1 Visit OT Evaluation $OT Eval Low Complexity: 1 Low  Jamilex Bohnsack L. Chloie Loney, OTR/L  09/23/23, 1:11 PM

## 2023-09-23 NOTE — Evaluation (Signed)
 Physical Therapy Evaluation Patient Details Name: Laura Hatfield MRN: [id number] DOB: Jun 02, 1940 Today's Date: 09/23/2023  History of Present Illness  Patient reports that she has been at home rehabilitating from an ankle fracture couple months ago.  She has been feeling a bit fatigued and generally weak working with physical therapy reports an in-home aide and nursing, to help her.  Yesterday she was alone, she was in her chair and she felt fatigued was going to transfer to her wheelchair but was too tired to do so.  She reports that she did not fall, she is very clear that she did not fall.  Instead she got onto the floor and slept on top of a rug.  She laid there until her cleaning service found her this morning  Clinical Impression  Patient received in ED on stretcher. She is a bit tangential with conversation, but otherwise pleasant. Patient required min A for bed mobility and transfers. Cga for ambulation ~ 30 feet. Brace donned on L LE. She will continue to benefit from skilled PT to improve strength, safety and independence.        If plan is discharge home, recommend the following: A little help with walking and/or transfers;A little help with bathing/dressing/bathroom;Help with stairs or ramp for entrance;Assist for transportation;Assistance with cooking/housework   Can travel by private vehicle    yes    Equipment Recommendations None recommended by PT  Recommendations for Other Services       Functional Status Assessment Patient has had a recent decline in their functional status and demonstrates the ability to make significant improvements in function in a reasonable and predictable amount of time.     Precautions / Restrictions Precautions Precautions: Fall Recall of Precautions/Restrictions: Intact Restrictions Weight Bearing Restrictions Per Provider Order: Yes LLE Weight Bearing Per Provider Order: Partial weight bearing Other Position/Activity Restrictions:  patient has soft brace for L ankle.      Mobility  Bed Mobility Overal bed mobility: Needs Assistance Bed Mobility: Supine to Sit, Sit to Supine     Supine to sit: Min assist, HOB elevated, Used rails Sit to supine: Min assist, Used rails        Transfers Overall transfer level: Needs assistance Equipment used: Rolling walker (2 wheels) Transfers: Sit to/from Stand Sit to Stand: Contact guard assist                Ambulation/Gait Ambulation/Gait assistance: Contact guard assist Gait Distance (Feet): 25 Feet Assistive device: Rolling walker (2 wheels) Gait Pattern/deviations: Step-to pattern, Decreased weight shift to left Gait velocity: decr     General Gait Details: slow steady pace, PWB on L LE due to pain with increased WB.  Stairs            Wheelchair Mobility     Tilt Bed    Modified Rankin (Stroke Patients Only)       Balance Overall balance assessment: Needs assistance Sitting-balance support: Feet supported Sitting balance-Leahy Scale: Good     Standing balance support: Bilateral upper extremity supported, During functional activity, Reliant on assistive device for balance Standing balance-Leahy Scale: Good                               Pertinent Vitals/Pain Pain Assessment Pain Assessment: No/denies pain    Home Living Family/patient expects to be discharged to:: Private residence Living Arrangements: Alone Available Help at Discharge: Family;Available PRN/intermittently Type of Home: House Home  Access: Stairs to enter Entrance Stairs-Rails: Right;Left;Can reach both Entrance Stairs-Number of Steps: 7   Home Layout: One level Home Equipment: Agricultural consultant (2 wheels);Toilet riser      Prior Function Prior Level of Function : Independent/Modified Independent;History of Falls (last six months)             Mobility Comments: amb with RW ADLs Comments: generally MOD I with ADL; intermittent assist for IADL,  orders groceries, has a cleaning service     Extremity/Trunk Assessment   Upper Extremity Assessment Upper Extremity Assessment: Defer to OT evaluation    Lower Extremity Assessment Lower Extremity Assessment: Generalized weakness;LLE deficits/detail LLE Deficits / Details: recent ankle fx. no significant pain reported. PWB. L LE externally rotated LLE Coordination: decreased gross motor    Cervical / Trunk Assessment Cervical / Trunk Assessment: Normal  Communication   Communication Communication: No apparent difficulties    Cognition Arousal: Alert Behavior During Therapy: WFL for tasks assessed/performed   PT - Cognitive impairments: Memory, Safety/Judgement, Problem solving                         Following commands: Intact       Cueing Cueing Techniques: Verbal cues     General Comments      Exercises     Assessment/Plan    PT Assessment Patient needs continued PT services  PT Problem List Decreased strength;Decreased activity tolerance;Decreased balance;Decreased mobility;Pain       PT Treatment Interventions DME instruction;Gait training;Stair training;Functional mobility training;Therapeutic activities;Therapeutic exercise;Balance training;Neuromuscular re-education;Patient/family education;Cognitive remediation    PT Goals (Current goals can be found in the Care Plan section)  Acute Rehab PT Goals Patient Stated Goal: return home with assistance from family PT Goal Formulation: With patient Time For Goal Achievement: 10/07/23 Potential to Achieve Goals: Good    Frequency Min 3X/week     Co-evaluation               AM-PAC PT 6 Clicks Mobility  Outcome Measure Help needed turning from your back to your side while in a flat bed without using bedrails?: A Little Help needed moving from lying on your back to sitting on the side of a flat bed without using bedrails?: A Little Help needed moving to and from a bed to a chair  (including a wheelchair)?: A Little Help needed standing up from a chair using your arms (e.g., wheelchair or bedside chair)?: A Little Help needed to walk in hospital room?: A Little Help needed climbing 3-5 steps with a railing? : A Little 6 Click Score: 18    End of Session   Activity Tolerance: Patient limited by pain;Patient limited by fatigue Patient left: in bed;with call bell/phone within reach Nurse Communication: Mobility status PT Visit Diagnosis: Other abnormalities of gait and mobility (R26.89);Muscle weakness (generalized) (M62.81);Difficulty in walking, not elsewhere classified (R26.2);Pain Pain - Right/Left: Left Pain - part of body: Ankle and joints of foot    Time: 8966-8943 PT Time Calculation (min) (ACUTE ONLY): 23 min   Charges:   PT Evaluation $PT Eval Moderate Complexity: 1 Mod   PT General Charges $$ ACUTE PT VISIT: 1 Visit        Liem Copenhaver, PT, GCS 09/23/23,12:22 PM

## 2023-09-23 NOTE — ED Notes (Signed)
 Pt states she feels okay going home tonight and setting up home health, Dr. Claudene MD made aware. Requested [employer] number, states that the family member picking her up. States she will let us  know what time pt's family member will be able to get here.

## 2023-09-23 NOTE — ED Notes (Signed)
 Pt stood and pivoted to the toilet and back to bed, states no other needs at this time.

## 2023-09-23 NOTE — ED Notes (Signed)
 Provided coffee, discussed plan of care, pt states no other needs at this time, call bell within reach, bed low and locked

## 2023-10-23 ENCOUNTER — Ambulatory Visit
Admission: EM | Admit: 2023-10-23 | Discharge: 2023-10-23 | Disposition: A | Discharge status: Home / Self Care | Attending: Physician Assistant | Admitting: Physician Assistant

## 2023-10-23 DIAGNOSIS — L97311 Non-pressure chronic ulcer of right ankle limited to breakdown of skin: Secondary | ICD-10-CM | POA: Diagnosis not present

## 2023-10-23 DIAGNOSIS — E119 Type 2 diabetes mellitus without complications: Secondary | ICD-10-CM

## 2023-10-23 MED ORDER — CEPHALEXIN 500 MG PO CAPS: 500.0000 mg | ORAL_CAPSULE | Freq: Three times a day (TID) | ORAL | 0 refills | Status: AC

## 2023-10-23 NOTE — ED Provider Notes (Signed)
 [facility]    CSN: [id number] Arrival date & time: 10/23/23  1045      History   Chief Complaint Chief Complaint  Patient presents with   Ulcer    HPI Laura Hatfield is a 83 y.o. female presenting for evaluation of wound of inner right ankle x 12 days. She had been using a boot on the left foot due to fractured ankle at the beginning of July. She has since transitioned to a ankle brace. A home healthcare nurse has been caring for the wound twice weekly but it has not improved much. There is light yellowish drainage. Mild pain. No fever. No other concerns.  Medical history significant for CKD, HLD, hx TIA, HTN, T2DM, and CAD.   HPI  Past Medical History:  Diagnosis Date   CAD (coronary artery disease)    Diabetes mellitus without complication (HCC)    Hyperlipidemia    Thyroid nodule     Patient Active Problem List   Diagnosis Date Noted   Sepsis secondary to UTI (HCC) 06/07/2023   Closed left ankle fracture 06/07/2023   Hypertensive urgency 07/26/2017   Aphasia 05/01/2017   HLD (hyperlipidemia) 05/01/2017   Diabetes (HCC) 05/01/2017   CAD (coronary artery disease) 05/01/2017   Stage 3b chronic kidney disease (HCC) 05/01/2017   TIA (transient ischemic attack) 03/21/2016    Past Surgical History:  Procedure Laterality Date   ANKLE SURGERY     CHOLECYSTECTOMY     EYE SURGERY     HUMERUS SURGERY      OB History   No obstetric history on file.      Home Medications    Prior to Admission medications   Medication Sig Start Date End Date Taking? Authorizing Provider  acetaminophen  (TYLENOL ) 500 MG tablet Take 500 mg by mouth every 6 (six) hours as needed.   Yes [provider]  aspirin  EC 81 MG tablet Take 81 mg by mouth daily.   Yes [provider]  cephALEXin  (KEFLEX ) 500 MG capsule Take 1 capsule (500 mg total) by mouth 3 (three) times daily for 7 days. 10/23/23 10/30/23 Yes Arvis Huxley B, PA-C  cholecalciferol  (VITAMIN  D) 1000 units tablet Take 1,000 Units by mouth at bedtime.    Yes [provider]  Continuous Glucose Sensor (FREESTYLE LIBRE 2 SENSOR) MISC  09/03/23  Yes [provider]  cyanocobalamin  (VITAMIN B12) 1000 MCG tablet Take 1,000 mcg by mouth daily.   Yes [provider]  insulin  glargine-yfgn (SEMGLEE ) 100 UNIT/ML injection Inject 0.12 mLs (12 Units total) into the skin at bedtime. 06/15/23  Yes Awanda City, MD  polyethylene glycol (MIRALAX  / GLYCOLAX ) 17 g packet Take 17 g by mouth daily. 06/16/23  Yes Awanda City, MD  colestipol  (COLESTID ) 1 g tablet Take 2 tablets (2 g total) by mouth 2 (two) times daily as needed. Home med. Patient not taking: Reported on 08/05/2023 06/15/23 09/13/23  Awanda City, MD  escitalopram (LEXAPRO) 5 MG tablet Take 5 mg by mouth daily. 12/15/17 12/14/19  [provider]  furosemide  (LASIX ) 40 MG tablet Take 60 mg by mouth daily as needed for fluid.  01/04/15 12/14/19  [provider]  gabapentin  (NEURONTIN ) 100 MG capsule Take 100 mg by mouth at bedtime as needed (for pain).   12/14/19  [provider]  metoprolol  succinate (TOPROL -XL) 100 MG 24 hr tablet Take 150 mg by mouth at bedtime.   12/14/19  [provider]  rosuvastatin  (CRESTOR ) 40 MG tablet  Take 20 mg by mouth at bedtime.   01/24/19  [provider]    Family History Family History  Problem Relation Age of Onset   Hypertension Mother    Diabetes Mother    Hypertension Father    Stroke Father     Social History Social History   Tobacco Use   Smoking status: Never   Smokeless tobacco: Never  Vaping Use   Vaping status: Never Used  Substance Use Topics   Alcohol use: No   Drug use: No     Allergies   Atorvastatin, Crestor  [rosuvastatin ], Ezetimibe, Lipitor [atorvastatin calcium ], Lisinopril, Losartan, and Penicillins   Review of Systems Review of Systems  Constitutional:  Negative for fatigue and fever.  Musculoskeletal:   Positive for joint swelling. Negative for arthralgias.  Skin:  Positive for wound. Negative for color change.  Neurological:  Negative for weakness and numbness.     Physical Exam Triage Vital Signs ED Triage Vitals  Encounter Vitals Group     BP      Girls Systolic BP Percentile      Girls Diastolic BP Percentile      Boys Systolic BP Percentile      Boys Diastolic BP Percentile      Pulse      Resp      Temp      Temp src      SpO2      Weight      Height      Head Circumference      Peak Flow      Pain Score      Pain Loc      Pain Education      Exclude from Growth Chart    No data found.  Updated Vital Signs BP (!) 156/74 (BP Location: Right Arm)   Pulse 86   Temp 98.4 F (36.9 C) (Oral)   Wt 202 lb 6.4 oz (91.8 kg)   SpO2 100%   BMI 31.70 kg/m   Physical Exam Vitals and nursing note reviewed.  Constitutional:      General: She is not in acute distress.    Appearance: Normal appearance. She is not ill-appearing or toxic-appearing.  HENT:     Head: Normocephalic and atraumatic.  Eyes:     General: No scleral icterus.       Right eye: No discharge.        Left eye: No discharge.     Conjunctiva/sclera: Conjunctivae normal.  Cardiovascular:     Rate and Rhythm: Normal rate.     Pulses: Normal pulses.  Pulmonary:     Effort: Pulmonary effort is normal. No respiratory distress.  Musculoskeletal:     Cervical back: Neck supple.     Comments: RIGHT ANKLE: Open wound right medial ankle with scant yellowish discharge on bandage. Mild tenderness and swelling. Full ROM ankle.  Skin:    General: Skin is dry.  Neurological:     General: No focal deficit present.     Mental Status: She is alert. Mental status is at baseline.     Motor: No weakness.     Gait: Gait abnormal.  Psychiatric:        Mood and Affect: Mood normal.        Behavior: Behavior normal.      UC Treatments / Results  Labs (all labs ordered are listed, but only abnormal results are  displayed) Labs Reviewed - No data to display  EKG  Radiology No results found. Study Result  Narrative & Impression  CLINICAL DATA:  Ankle fracture follow-up.   EXAM: CT OF THE LEFT ANKLE WITHOUT CONTRAST   TECHNIQUE: Multidetector CT imaging of the left ankle was performed according to the standard protocol. Multiplanar CT image reconstructions were also generated.   RADIATION DOSE REDUCTION: This exam was performed according to the departmental dose-optimization program which includes automated exposure control, adjustment of the mA and/or kV according to patient size and/or use of iterative reconstruction technique.   COMPARISON:  Left ankle radiographs dated 08/05/2023. CT of the left ankle dated 06/04/2023.   FINDINGS: Bones/Joint/Cartilage   Diffuse osseous demineralization.  No acute fracture.   Posterior malleolar fracture demonstrates marginal callus formation with minimal bridging noted along the medial most margin.   Displaced cortical fracture of the anterolateral corner of the distal tibia measures 15 x 10 x 6 mm and demonstrates 7 mm of lateral displacement. No evidence of marginal callus formation.   Thin medial talar cortical avulsion fragments are again noted.   Moderate tibiotalar joint space narrowing with marginal spurring. Moderate posterior subtalar joint space narrowing. Redemonstrated os trigonum with degenerative change and near-complete ankylosis of the synchondrosis. Moderate talonavicular joint space narrowing with dorsal spurring. Moderate first through fifth TMT joint space narrowing with osteophytosis.   Ligaments   Ligaments are suboptimally evaluated by CT.   Muscles and Tendons Fatty atrophy of the intrinsic musculature of the foot. Visualized tendons appear intact.   Soft tissue Soft tissue swelling of the ankle. No discrete loculated collection.   IMPRESSION: 1. Posterior malleolar fracture demonstrates marginal  callus formation with minimal bridging noted along the medial margin. 2. Displaced cortical fracture of the anterolateral corner of the distal tibia without evidence of marginal callus formation. 3. Similar medial talar cortical avulsion fracture. 4. Moderate arthritic changes of the hindfoot and midfoot, as above.     Electronically Signed   By: Harrietta Sherry M.D.   On: 09/03/2023 09:18     Procedures Procedures (including critical care time)  Medications Ordered in UC Medications - No data to display  Initial Impression / Assessment and Plan / UC Course  I have reviewed the triage vital signs and the nursing notes.  Pertinent labs & imaging results that were available during my care of the patient were reviewed by me and considered in my medical decision making (see chart for details).   83 year old female with history of diabetes, CAD, hyperlipidemia, hypertension and stage 3 CKD presents for evaluation of pressure ulcer to the right medial ankle due to contact with a cam boot.  Patient was wearing a cam boot on the left ankle for several weeks after ankle fracture.  She has now transition to a ankle brace.  Patient has been seen by home health nurse who has been helping her clean the wound couple times a week but they believe she may need antibiotic.  Patient is afebrile.  See image included in chart of patient's wound.  Will cover patient at this time with an antibiotic.  I sent Keflex  500 mg 3 times daily x 7 days to pharmacy and placed a referral to wound care.  I did review I calculated patient's creatinine clearance to be 30 ml/min so no medication dose adjustment.  Wound was cleaned by nursing staff and applied nonadherent bandage and Coban.  Advised to continue with dressing changes and follow-up with wound care.  ED precautions reviewed.  Acute complicated injury.   Final Clinical  Impressions(s) / UC Diagnoses   Final diagnoses:  Skin ulcer of right ankle, limited  to breakdown of skin (HCC)  Type 2 diabetes mellitus without complication, unspecified whether long term insulin  use [facility])     Discharge Instructions      - I sent antibiotics for you. - Continue to clean and bandaged the area as directed. - I put a referral into wound care for you.  They should contact you next week.     ED Prescriptions     Medication Sig Dispense Auth. Provider   cephALEXin  (KEFLEX ) 500 MG capsule Take 1 capsule (500 mg total) by mouth 3 (three) times daily for 7 days. 21 capsule Arvis Jolan NOVAK, PA-C      PDMP not reviewed this encounter.   Arvis Jolan NOVAK, PA-C 10/23/23 1226

## 2023-10-23 NOTE — ED Triage Notes (Signed)
 Pt c/o injury to right foot x1-2weeks  Pt states that she broke her foot and was placed in a boot.   Pt states that the boot rubbed against her left ankle and is now having an ulcer from the boot.  Pt states that she does receive home care.   Pt was told to come in and be evaluated by her PCP.  Pt has a wound along the medial side of the left ankle  Pt states that the initial wound was a small scab, but it has gotten larger.

## 2023-10-23 NOTE — Discharge Instructions (Addendum)
-   I sent antibiotics for you. - Continue to clean and bandaged the area as directed. - I put a referral into wound care for you.  They should contact you next week.

## 2023-10-25 ENCOUNTER — Telehealth: Payer: Self-pay

## 2023-10-25 NOTE — Telephone Encounter (Signed)
 Received a call from Leita at [facility] in [place], stating that the patient had a concern over their medication that was sent in on 10/23/23. Pt has a penicillin allergy that causes facial swelling and was prescribed Keflex  500mg . Pt was informed at the pharmacy that it is related to Penicillin and the patient asks for the medication to be changed. The pharmacy has called asking for the medication to be changed per patient request.

## 2023-10-28 ENCOUNTER — Ambulatory Visit: Admitting: Podiatry

## 2023-10-28 ENCOUNTER — Ambulatory Visit (INDEPENDENT_AMBULATORY_CARE_PROVIDER_SITE_OTHER)

## 2023-10-28 DIAGNOSIS — M21962 Unspecified acquired deformity of left lower leg: Secondary | ICD-10-CM | POA: Diagnosis not present

## 2023-10-28 DIAGNOSIS — S82892G Other fracture of left lower leg, subsequent encounter for closed fracture with delayed healing: Secondary | ICD-10-CM

## 2023-10-28 DIAGNOSIS — L97312 Non-pressure chronic ulcer of right ankle with fat layer exposed: Secondary | ICD-10-CM | POA: Diagnosis not present

## 2023-10-28 MED ORDER — DOXYCYCLINE HYCLATE 100 MG PO TABS: 100.0000 mg | ORAL_TABLET | Freq: Two times a day (BID) | ORAL | 0 refills | Status: AC

## 2023-10-30 NOTE — Progress Notes (Signed)
  Subjective:  Patient ID: Laura Hatfield, female    DOB: [birth year],  MRN: [id number]  Chief Complaint  Patient presents with   Fracture    RM 8 follow up left foot. Patient is concerned with right ankle wound, since wearing boot.     83 y.o. female presents with the above complaint. History confirmed with patient.  She returns for follow-up doing fairly well with the left foot has been using the ankle brace here.  She has ulceration that is worsened on the right ankle since last visit was concerned about infection and went to urgent care for this  Objective:  Physical Exam: warm, good capillary refill, no trophic changes or ulcerative lesions, normal DP and PT pulses, and some peripheral neuropathy is noted, she has valgus and abductory deformity of the ankle and hindfoot and midfoot with the plantar medial callus over the talonavicular joint no pain around the ankle joint today there is edema.  Right ankle has a full-thickness ulceration measuring 1.5 x 1.2 cm x 0.3 cm with exposed subcutaneous tissue no signs of active infection no drainage fibrogranular wound bed surrounding hyperkeratosis.  CT scan and tib-fib films from 06/04/2023 shows PER 4 fracture with small posterior malleolar fracture and Maisonneuve fracture proximal fibula  CT scan 09/02/2023 shows some interval bridging of posterior malleolar fracture, no change in alignment   Radiographs: Multiple views x-ray of the left ankle: New films taken today show continued secondary bone healing some diastases of the syndesmosis Assessment:   1. Closed fracture of left ankle with delayed healing, subsequent encounter   2. Acquired ankle deformity, left   3. Ulcer of right ankle, with fat layer exposed (HCC)      Plan:  Patient was evaluated and treated and all questions answered.  Her radiographs show relatively unchanged alignment of her ankle and mortise, has been able to minimally ambulate with a walker.  I recommended  an AFO to be fashioned for this with a double upright and Rx was written for this and given to her to schedule for fitting at [facility].  Regarding her right ankle she has a full-thickness ulceration that has worsened and there is no active signs of infection I did place her on doxycycline  as a precaution, debrided the wound with a sharp sterile ring curette and an excisional manner to the subcutaneous layer to remove biofilm slough nonviable tissue and hyperkeratosis.  Predebridement measurements of the wound were 1.3 cm x 1.2 cm x 0.2 cm.  Total area of debridement of removed tissue is 0.2 cm.  Hemostasis was achieved manually.  It was carried out the subcutaneous layer and then dressed with Prisma collagen dressing and a foam silicone border dressing.  Referral for continued wound care will be sent to her home nursing agency with [facility].  Follow-up with me in 2 weeks to reevaluate and    Return in about 2 weeks (around 11/11/2023) for R ankle wound care.

## 2023-11-11 ENCOUNTER — Ambulatory Visit: Admitting: Podiatry

## 2023-11-11 VITALS — Ht 67.0 in | Wt 202.4 lb

## 2023-11-11 DIAGNOSIS — L97312 Non-pressure chronic ulcer of right ankle with fat layer exposed: Secondary | ICD-10-CM | POA: Diagnosis not present

## 2023-11-14 ENCOUNTER — Encounter: Payer: Self-pay | Admitting: Podiatry

## 2023-11-14 NOTE — Progress Notes (Signed)
  Subjective:  Patient ID: Laura Hatfield, female    DOB: Nov 14, 1940,  MRN: [id number]  Chief Complaint  Patient presents with   Wound Check    RM 9 Patient is here for wound on right ankle.   Fracture    Left ankle follow up. Right ankle has a ulcer on it per the patient.    83 y.o. female presents with the above complaint. History confirmed with patient.  She returns for follow-up and ongoing wound care  Objective:  Physical Exam: warm, good capillary refill, no trophic changes or ulcerative lesions, normal DP and PT pulses, and some peripheral neuropathy is noted, she has valgus and abductory deformity of the ankle and hindfoot and midfoot with the plantar medial callus over the talonavicular joint no pain around the ankle joint today there is edema.  Right ankle has a full-thickness ulceration measuring 1.0 x 1.2 cm x 0.3 cm with exposed subcutaneous tissue no signs of active infection no drainage fibrogranular wound bed surrounding hyperkeratosis.  CT scan and tib-fib films from 06/04/2023 shows PER 4 fracture with small posterior malleolar fracture and Maisonneuve fracture proximal fibula  CT scan 09/02/2023 shows some interval bridging of posterior malleolar fracture, no change in alignment    Radiographs: Multiple views x-ray of the left ankle: New films taken today show continued secondary bone healing some diastases of the syndesmosis Assessment:   1. Ulcer of right ankle, with fat layer exposed (HCC)      Plan:  Patient was evaluated and treated and all questions answered.    On her right ankle she has a full-thickness ulceration that has worsened and there is no active signs of infection.  Today I debrided the wound with a sharp sterile ring curette and an excisional manner to the subcutaneous layer to remove biofilm slough nonviable tissue and hyperkeratosis.  Predebridement measurements of the wound were 0.8 cm x 1.2 cm x 0.2 cm.  Total area of debridement of  removed tissue is 0.2 cm.  Hemostasis was achieved manually.  It was carried out the subcutaneous layer and then dressed with Prisma collagen dressing and a foam silicone border dressing.   Continue this at home as well    Return in about 3 weeks (around 12/02/2023) for wound care.

## 2023-11-28 ENCOUNTER — Emergency Department
Admission: EM | Admit: 2023-11-28 | Discharge: 2023-11-28 | Disposition: A | Discharge status: Home / Self Care | Attending: Emergency Medicine | Admitting: Emergency Medicine

## 2023-11-28 ENCOUNTER — Other Ambulatory Visit: Payer: Self-pay

## 2023-11-28 DIAGNOSIS — F32A Depression, unspecified: Secondary | ICD-10-CM | POA: Insufficient documentation

## 2023-11-28 DIAGNOSIS — R630 Anorexia: Secondary | ICD-10-CM | POA: Diagnosis not present

## 2023-11-28 DIAGNOSIS — I2510 Atherosclerotic heart disease of native coronary artery without angina pectoris: Secondary | ICD-10-CM | POA: Diagnosis not present

## 2023-11-28 DIAGNOSIS — E119 Type 2 diabetes mellitus without complications: Secondary | ICD-10-CM | POA: Diagnosis not present

## 2023-11-28 DIAGNOSIS — N184 Chronic kidney disease, stage 4 (severe): Secondary | ICD-10-CM | POA: Diagnosis not present

## 2023-11-28 DIAGNOSIS — N189 Chronic kidney disease, unspecified: Secondary | ICD-10-CM | POA: Insufficient documentation

## 2023-11-28 DIAGNOSIS — R42 Dizziness and giddiness: Secondary | ICD-10-CM | POA: Diagnosis present

## 2023-11-28 DIAGNOSIS — R5383 Other fatigue: Secondary | ICD-10-CM

## 2023-11-28 LAB — CBC WITH DIFFERENTIAL/PLATELET
Abs Immature Granulocytes: 0.04 K/uL (ref 0.00–0.07)
Basophils Absolute: 0 K/uL (ref 0.0–0.1)
Basophils Relative: 1 %
Eosinophils Absolute: 0.1 K/uL (ref 0.0–0.5)
Eosinophils Relative: 2 %
HCT: 34.9 % — ABNORMAL LOW (ref 36.0–46.0)
Hemoglobin: 11.5 g/dL — ABNORMAL LOW (ref 12.0–15.0)
Immature Granulocytes: 1 %
Lymphocytes Relative: 56 %
Lymphs Abs: 3.8 K/uL (ref 0.7–4.0)
MCH: 28.8 pg (ref 26.0–34.0)
MCHC: 33 g/dL (ref 30.0–36.0)
MCV: 87.5 fL (ref 80.0–100.0)
Monocytes Absolute: 0.2 K/uL (ref 0.1–1.0)
Monocytes Relative: 4 %
Neutro Abs: 2.4 K/uL (ref 1.7–7.7)
Neutrophils Relative %: 36 %
Platelets: 333 K/uL (ref 150–400)
RBC: 3.99 MIL/uL (ref 3.87–5.11)
RDW: 15.6 % — ABNORMAL HIGH (ref 11.5–15.5)
WBC: 6.6 K/uL (ref 4.0–10.5)
nRBC: 0 % (ref 0.0–0.2)

## 2023-11-28 LAB — TROPONIN I (HIGH SENSITIVITY)
Troponin I (High Sensitivity): 21 ng/L — ABNORMAL HIGH (ref <18)
Troponin I (High Sensitivity): 20 ng/L — ABNORMAL HIGH (ref <18)

## 2023-11-28 LAB — COMPREHENSIVE METABOLIC PANEL WITH GFR
ALT: 9 U/L (ref 0–44)
AST: 20 U/L (ref 15–41)
Albumin: 3.5 g/dL (ref 3.5–5.0)
Alkaline Phosphatase: 71 U/L (ref 38–126)
Anion gap: 11 (ref 5–15)
BUN: 18 mg/dL (ref 8–23)
CO2: 22 mmol/L (ref 22–32)
Calcium: 9.2 mg/dL (ref 8.9–10.3)
Chloride: 107 mmol/L (ref 98–111)
Creatinine, Ser: 1.7 mg/dL — ABNORMAL HIGH (ref 0.44–1.00)
GFR, Estimated: 29 mL/min — ABNORMAL LOW (ref >60)
Glucose, Bld: 162 mg/dL — ABNORMAL HIGH (ref 70–99)
Potassium: 3.7 mmol/L (ref 3.5–5.1)
Sodium: 140 mmol/L (ref 135–145)
Total Bilirubin: 0.7 mg/dL (ref 0.0–1.2)
Total Protein: 7 g/dL (ref 6.5–8.1)

## 2023-11-28 MED ORDER — LACTATED RINGERS IV BOLUS
1000.0000 mL | Freq: Once | INTRAVENOUS | Status: AC
  Administered 2023-11-28: 1000 mL via INTRAVENOUS

## 2023-11-28 MED ORDER — ONDANSETRON HCL 4 MG/2ML IJ SOLN
4.0000 mg | Freq: Once | INTRAMUSCULAR | Status: AC
  Administered 2023-11-28: 4 mg via INTRAVENOUS
  Filled 2023-11-28: qty 2

## 2023-11-28 NOTE — ED Provider Notes (Signed)
 [facility] Provider Note    Event Date/Time   First MD Initiated Contact with Patient 11/28/23 1606     (approximate)   History   Chief Complaint: Fatigue   HPI  Laura Hatfield is a 83 y.o. female with a history of diabetes, CAD, CKD who comes to the ED due to fatigue, dizziness that started yesterday.  Also reports loss of appetite, poor oral intake today.  No falls or trauma.  Notes that she was just started on new medication 2 days ago for depression and also received a flu vaccine yesterday.  Denies focal pain, no fever.        Past Medical History:  Diagnosis Date   CAD (coronary artery disease)    Diabetes mellitus without complication (HCC)    Hyperlipidemia    Thyroid nodule     Current Outpatient Rx   Order #: [id number] Class: Historical Med   Order #: [id number] Class: Historical Med   Order #: [id number] Class: Historical Med   Order #: [id number] Class: No Print   Order #: [id number] Class: Historical Med   Order #: [id number] Class: Historical Med   Order #: [id number] Class: No Print   Order #: [id number] Class: No Print    Past Surgical History:  Procedure Laterality Date   ANKLE SURGERY     CHOLECYSTECTOMY     EYE SURGERY     HUMERUS SURGERY      Physical Exam   Triage Vital Signs: ED Triage Vitals  Encounter Vitals Group     BP 11/28/23 1609 (!) 94/56     Girls Systolic BP Percentile --      Girls Diastolic BP Percentile --      Boys Systolic BP Percentile --      Boys Diastolic BP Percentile --      Pulse Rate 11/28/23 1609 83     Resp 11/28/23 1609 18     Temp 11/28/23 1609 98.4 F (36.9 C)     Temp Source 11/28/23 2017 Oral     SpO2 11/28/23 1609 100 %     Weight 11/28/23 1610 195 lb (88.5 kg)     Height 11/28/23 1610 5' 7 (1.702 m)     Head Circumference --      Peak Flow --      Pain Score 11/28/23 1609 0     Pain Loc --      Pain Education --      Exclude from Growth Chart --     Most recent vital  signs: Vitals:   11/28/23 2000 11/28/23 2017  BP: 138/65   Pulse: 82   Resp: 13   Temp:  98.4 F (36.9 C)  SpO2: 94%     General: Awake, no distress.  CV:  Good peripheral perfusion.  Regular rate rhythm Resp:  Normal effort.  Clear lungs Abd:  No distention.  Soft nontender Other:  Dry oral mucosa   ED Results / Procedures / Treatments   Labs (all labs ordered are listed, but only abnormal results are displayed) Labs Reviewed  COMPREHENSIVE METABOLIC PANEL WITH GFR - Abnormal; Notable for the following components:      Result Value   Glucose, Bld 162 (*)    Creatinine, Ser 1.70 (*)    GFR, Estimated 29 (*)    All other components within normal limits  CBC WITH DIFFERENTIAL/PLATELET - Abnormal; Notable for the following components:   Hemoglobin 11.5 (*)    HCT 34.9 (*)    RDW 15.6 (*)  All other components within normal limits  TROPONIN I (HIGH SENSITIVITY) - Abnormal; Notable for the following components:   Troponin I (High Sensitivity) 21 (*)    All other components within normal limits  TROPONIN I (HIGH SENSITIVITY) - Abnormal; Notable for the following components:   Troponin I (High Sensitivity) 20 (*)    All other components within normal limits  LIPASE, BLOOD     EKG Interpreted by me Sinus rhythm rate of 80.  Normal axis.  Mildly prolonged QTc of 507 ms.  Poor R wave progression.  No acute ischemic changes.   RADIOLOGY    PROCEDURES:  Procedures   MEDICATIONS ORDERED IN ED: Medications  lactated ringers  bolus 1,000 mL (0 mLs Intravenous Stopped 11/28/23 1817)  ondansetron  (ZOFRAN ) injection 4 mg (4 mg Intravenous Given 11/28/23 1622)     IMPRESSION / MDM / ASSESSMENT AND PLAN / ED COURSE  I reviewed the triage vital signs and the nursing notes.  DDx: Dehydration, AKI, electrolyte derangement, anemia, non-STEMI  Patient's presentation is most consistent with acute presentation with potential threat to life or bodily function.  Patient  presents with malaise and fatigue, most likely related to change in medication and flu vaccine yesterday.  She does appear somewhat dehydrated.  After IV fluids she is feeling well.  She is tolerating oral intake.  Exam is reassuring with normal vital signs, no focal exam findings.  Lab work is unremarkable with baseline CKD 4.  Stable for discharge.       FINAL CLINICAL IMPRESSION(S) / ED DIAGNOSES   Final diagnoses:  Malaise and fatigue  CKD (chronic kidney disease) stage 4, GFR 15-29 ml/min (HCC)     Rx / DC Orders   ED Discharge Orders     None        Note:  This document was prepared using Dragon voice recognition software and may include unintentional dictation errors.   Viviann Pastor, MD 11/28/23 2105

## 2023-11-28 NOTE — ED Triage Notes (Signed)
 Pt arrives via [facility] from home for not feeling like herself. Recently in rehab for L foot injury. Pt was given new medications for depression and a flu shot yesterday. Pt was feeling dizzy yesterday. No falls recently  EMS vitals: 111/57 BP 133 CBG

## 2023-11-28 NOTE — ED Notes (Signed)
 Pts cousin, Curtistine Baptist, stated he will be here in 30 minutes to pick pt up

## 2023-11-28 NOTE — Discharge Instructions (Addendum)
 Your examination and lab test today were all reassuring.  Continue taking your medications, eating regularly, and please follow-up with your doctor.

## 2023-12-02 ENCOUNTER — Ambulatory Visit: Admitting: Podiatry

## 2023-12-02 ENCOUNTER — Ambulatory Visit

## 2023-12-02 DIAGNOSIS — L97312 Non-pressure chronic ulcer of right ankle with fat layer exposed: Secondary | ICD-10-CM | POA: Diagnosis not present

## 2023-12-02 DIAGNOSIS — S82892G Other fracture of left lower leg, subsequent encounter for closed fracture with delayed healing: Secondary | ICD-10-CM | POA: Diagnosis not present

## 2023-12-03 NOTE — Progress Notes (Signed)
  Subjective:  Patient ID: Laura Hatfield, female    DOB: [birth year],  MRN: [id number]  Chief Complaint  Patient presents with   Wound Check    Rm 3 Pt is here for wound on right ankle. Wound is open with moderate drainage.    83 y.o. female presents with the above complaint. History confirmed with patient.  She returns for follow-up and ongoing wound care and follow-up on her ankle fracture.  She notes minimal pain in her left ankle.  Tylenol  seems to be controlling it when she needs it.  She was fitted for her brace and is hopefully picking it up in the next few weeks  Objective:  Physical Exam: warm, good capillary refill, no trophic changes or ulcerative lesions, normal DP and PT pulses, and some peripheral neuropathy is noted, she has valgus and abductory deformity of the ankle and hindfoot and midfoot with the plantar medial callus over the talonavicular joint no pain around the ankle joint today there is edema.  Right ankle has a full-thickness ulceration measuring 0.8 x 0.8 cm x 0.4 cm with exposed subcutaneous tissue no signs of active infection no drainage fibrogranular wound bed surrounding hyperkeratosis.  CT scan and tib-fib films from 06/04/2023 shows PER 4 fracture with small posterior malleolar fracture and Maisonneuve fracture proximal fibula  CT scan 09/02/2023 shows some interval bridging of posterior malleolar fracture, no change in alignment    Radiographs: Multiple views x-ray of the left ankle: New films taken today show stable alignment with rotatory and valgus deformity of the hindfoot and midfoot Assessment:   1. Closed fracture of left ankle with delayed healing, subsequent encounter   2. Ulcer of right ankle, with fat layer exposed (HCC)      Plan:  Patient was evaluated and treated and all questions answered.   Regarding her left ankle fracture seems to be stable.  She has been fitted for a double upright brace and is receiving it soon.  Hopefully  should refrain of relative stability for her long-term.  We discussed long-term if this is not successful at [facility] fusion would be the only surgical option which would be a significant recovery for her.  OTC Tylenol  as needed for pain control if this is bothersome.  On her right ankle she has a full-thickness ulceration that has worsened and there is no active signs of infection.  Today I debrided the wound with a sharp sterile ring curette and an excisional manner to the subcutaneous layer to remove biofilm slough nonviable tissue and hyperkeratosis.  Predebridement measurements of the wound were 0.6 cm x 0.6 cm x 0.2 cm.  Postdebridement measurements are 0.8 x 0.8 x 0.4 cm total area of debridement of removed tissue is 0.2 cm.  Hemostasis was achieved manually.  It was completed to the subcutaneous layer and then dressed with Prisma collagen dressing and a foam silicone border dressing.   Continue this at home as well with home nursing, appears to be healing well    No follow-ups on file.

## 2023-12-12 NOTE — Telephone Encounter (Signed)
 MRN: [id number] DOB: 10/13/1940  83 y.o. female Peyser, Wolm Hummer, MD   blood Wt Readings from Last 1 Encounters:  11/20/23 1304 89 kg (196 lb 3.4 oz)     OPENING STATEMENT GIVEN. PT VERIFIED WITH 3 HIPAA VERIFIERS.   Patient is experiencing new/worsening symptoms.  Elevated BP today 160/100 the latest states she is upset and that's when it goes up. Appt scheduled per Patient request for Thurs/Fri due to transportation issues.    Care Advice given per protocol   Instructed caller to seek immediate medical attention for any new/worsening symptoms, questions or concerns.  Caller verbalizes understanding and is without questions at this time.                        Reason for Disposition . BP  >= 160/100  Additional Information . Negative: Difficult to awaken or acting confused  (e.g., disoriented, slurred speech) . Negative: Severe difficulty breathing (e.g., struggling for each breath, speaks in single words) . Negative: [1] Weakness of the face, arm or leg on one side of the body AND [2] new onset . Negative: [1] Numbness (i.e., loss of sensation) of the face, arm or leg on one side of the body AND [2] new onset . Negative: [1] Chest pain lasts > 5 minutes AND [2] history of heart disease  (i.e., heart attack, bypass surgery, angina, angioplasty, CHF) . Negative: [1] Chest pain AND [2] took nitrogylcerin AND [3] pain was not relieved . Negative: Sounds like a life-threatening emergency to the triager . Negative: Symptom is main concern  (e.g., headache, chest pain) . Negative: Low blood pressure is main concern . Negative: [1] BP  >= 160 / 100 AND [2] cardiac or neurologic symptoms    (e.g., chest pain, difficulty breathing, unsteady gait, blurred vision) . Negative: [1] Pregnant AND [2] new hand or face swelling . Negative: [1] Pregnant > 20 weeks AND [2] BP  >= 140/90 . Negative: [1] BP  >= 200/120  AND [2] having NO cardiac or neurologic symptoms . Negative: [1]  BP  >= 180/110 AND [2] missed most recent dose of blood pressure medication . Negative: BP  >= 180/110 . Negative: Ran out of BP medications  Answer Assessment - Initial Assessment Questions 1. BLOOD PRESSURE: What is the blood pressure? Did you take at least two measurements 5 minutes apart? 175/108 second 162/93 third 160/100 2. ONSET: When did you take your blood pressure? 1135 3. HOW: How did you obtain the blood pressure? (e.g., visiting nurse, automatic home BP monitor) arm 4. HISTORY: Do you have a history of high blood pressure? no 5. MEDICATIONS: Are you taking any medications for blood pressure? Have you missed any doses recently? no 6. OTHER SYMPTOMS: Do you have any symptoms? (e.g., headache, chest pain, blurred vision, difficulty breathing, weakness) no 7. PREGNANCY: Is there any chance you are pregnant? When was your last menstrual period? n/a  Protocols used: High Blood Pressure-A-AH

## 2023-12-21 ENCOUNTER — Emergency Department
Admission: EM | Admit: 2023-12-21 | Discharge: 2023-12-21 | Disposition: A | Discharge status: Home / Self Care | Source: Ambulatory Visit | Attending: Emergency Medicine | Admitting: Emergency Medicine

## 2023-12-21 ENCOUNTER — Other Ambulatory Visit: Payer: Self-pay

## 2023-12-21 ENCOUNTER — Encounter: Payer: Self-pay | Admitting: Emergency Medicine

## 2023-12-21 DIAGNOSIS — E119 Type 2 diabetes mellitus without complications: Secondary | ICD-10-CM | POA: Diagnosis not present

## 2023-12-21 DIAGNOSIS — I2510 Atherosclerotic heart disease of native coronary artery without angina pectoris: Secondary | ICD-10-CM | POA: Diagnosis not present

## 2023-12-21 DIAGNOSIS — I10 Essential (primary) hypertension: Secondary | ICD-10-CM | POA: Diagnosis present

## 2023-12-21 LAB — CBC
HCT: 33.6 % — ABNORMAL LOW (ref 36.0–46.0)
Hemoglobin: 10.7 g/dL — ABNORMAL LOW (ref 12.0–15.0)
MCH: 28.5 pg (ref 26.0–34.0)
MCHC: 31.8 g/dL (ref 30.0–36.0)
MCV: 89.4 fL (ref 80.0–100.0)
Platelets: 322 K/uL (ref 150–400)
RBC: 3.76 MIL/uL — ABNORMAL LOW (ref 3.87–5.11)
RDW: 16.4 % — ABNORMAL HIGH (ref 11.5–15.5)
WBC: 5.4 K/uL (ref 4.0–10.5)
nRBC: 0 % (ref 0.0–0.2)

## 2023-12-21 LAB — BASIC METABOLIC PANEL WITH GFR
Anion gap: 14 (ref 5–15)
BUN: 18 mg/dL (ref 8–23)
CO2: 21 mmol/L — ABNORMAL LOW (ref 22–32)
Calcium: 9.2 mg/dL (ref 8.9–10.3)
Chloride: 107 mmol/L (ref 98–111)
Creatinine, Ser: 1.29 mg/dL — ABNORMAL HIGH (ref 0.44–1.00)
GFR, Estimated: 41 mL/min — ABNORMAL LOW (ref >60)
Glucose, Bld: 146 mg/dL — ABNORMAL HIGH (ref 70–99)
Potassium: 3.8 mmol/L (ref 3.5–5.1)
Sodium: 142 mmol/L (ref 135–145)

## 2023-12-21 NOTE — ED Notes (Signed)
 EDP, Kinner at bedside.

## 2023-12-21 NOTE — ED Provider Notes (Signed)
 [facility] Provider Note    Event Date/Time   First MD Initiated Contact with Patient 12/21/23 1644     (approximate)   History   Hypertension   HPI  Laura Hatfield is a 83 y.o. female with history of CAD, diabetes who presents with high blood pressure.  Patient reports typically her blood pressure is between 120 and 140 systolic but today when her physical therapist checked it it was 180 systolic.  She reports she felt well but admits that she panicked .  She has no complaints of chest pain headache or neurodeficits.  Blood pressure has improved in the emergency department.  Review of records demonstrates the patient has had low blood pressure and was treated with IV fluids for that in the past     Physical Exam   Triage Vital Signs: ED Triage Vitals  Encounter Vitals Group     BP 12/21/23 1544 (!) 141/85     Girls Systolic BP Percentile --      Girls Diastolic BP Percentile --      Boys Systolic BP Percentile --      Boys Diastolic BP Percentile --      Pulse Rate 12/21/23 1544 91     Resp 12/21/23 1544 16     Temp 12/21/23 1544 98.6 F (37 C)     Temp Source 12/21/23 1544 Oral     SpO2 12/21/23 1544 100 %     Weight 12/21/23 1555 83.5 kg (184 lb)     Height 12/21/23 1555 1.702 m (5' 7)     Head Circumference --      Peak Flow --      Pain Score 12/21/23 1555 4     Pain Loc --      Pain Education --      Exclude from Growth Chart --     Most recent vital signs: Vitals:   12/21/23 1544 12/21/23 1734  BP: (!) 141/85 (!) 143/89  Pulse: 91 83  Resp: 16 17  Temp: 98.6 F (37 C) 97.7 F (36.5 C)  SpO2: 100% 100%     General: Awake, no distress.  CV:  Good peripheral perfusion.  Regular rate and rhythm Resp:  Normal effort.  Abd:  No distention.  Soft, nontender Other:     ED Results / Procedures / Treatments   Labs (all labs ordered are listed, but only abnormal results are displayed) Labs Reviewed  CBC - Abnormal;  Notable for the following components:      Result Value   RBC 3.76 (*)    Hemoglobin 10.7 (*)    HCT 33.6 (*)    RDW 16.4 (*)    All other components within normal limits  BASIC METABOLIC PANEL WITH GFR - Abnormal; Notable for the following components:   CO2 21 (*)    Glucose, Bld 146 (*)    Creatinine, Ser 1.29 (*)    GFR, Estimated 41 (*)    All other components within normal limits     EKG  ED ECG REPORT I, Lamar Price, the attending physician, personally viewed and interpreted this ECG.  Date: 12/21/2023  Rhythm: normal sinus rhythm QRS Axis: normal Intervals: Abnormal QT ST/T Wave abnormalities: normal Narrative Interpretation: no evidence of acute ischemia    RADIOLOGY     PROCEDURES:  Critical Care performed:   Procedures   MEDICATIONS ORDERED IN ED: Medications - No data to display   IMPRESSION / MDM / ASSESSMENT  AND PLAN / ED COURSE  I reviewed the triage vital signs and the nursing notes. Patient's presentation is most consistent with exacerbation of chronic illness.  Patient presents with high blood pressure as detailed above, blood pressure found to be much better here in the emergency department and at home.  Review of blood pressures demonstrates she is in line with prior levels.  She has also had hypotensive episodes in the past requiring IV fluids and because of that she has not been put on blood pressure medication.  Lab work today is reassuring, EKG is reassuring, discussed with her close follow-up with PCP to discuss low-dose of blood pressure medication risks and benefits        FINAL CLINICAL IMPRESSION(S) / ED DIAGNOSES   Final diagnoses:  Hypertension, unspecified type     Rx / DC Orders   ED Discharge Orders     None        Note:  This document was prepared using Dragon voice recognition software and may include unintentional dictation errors.   Arlander Charleston, MD 12/21/23 2037

## 2023-12-21 NOTE — ED Triage Notes (Signed)
 Pt in via POV, reports seeing [facility] today due to a headache and hypertension x 2 days.  Reports BP at home in 180's systolic.  Denies any dizziness or other associated symptoms.  A/Ox4, NAD noted at this time.

## 2023-12-30 ENCOUNTER — Ambulatory Visit: Admitting: Podiatry

## 2024-01-18 ENCOUNTER — Observation Stay
Admission: EM | Admit: 2024-01-18 | Discharge: 2024-01-25 | Disposition: A | Discharge status: Skilled Nursing Facility | Attending: Emergency Medicine | Admitting: Emergency Medicine

## 2024-01-18 ENCOUNTER — Emergency Department

## 2024-01-18 ENCOUNTER — Other Ambulatory Visit: Payer: Self-pay

## 2024-01-18 DIAGNOSIS — K5900 Constipation, unspecified: Secondary | ICD-10-CM | POA: Insufficient documentation

## 2024-01-18 DIAGNOSIS — Z7982 Long term (current) use of aspirin: Secondary | ICD-10-CM | POA: Insufficient documentation

## 2024-01-18 DIAGNOSIS — I129 Hypertensive chronic kidney disease with stage 1 through stage 4 chronic kidney disease, or unspecified chronic kidney disease: Secondary | ICD-10-CM | POA: Insufficient documentation

## 2024-01-18 DIAGNOSIS — I10 Essential (primary) hypertension: Secondary | ICD-10-CM | POA: Diagnosis present

## 2024-01-18 DIAGNOSIS — D72829 Elevated white blood cell count, unspecified: Secondary | ICD-10-CM | POA: Diagnosis present

## 2024-01-18 DIAGNOSIS — W1830XA Fall on same level, unspecified, initial encounter: Secondary | ICD-10-CM | POA: Insufficient documentation

## 2024-01-18 DIAGNOSIS — E785 Hyperlipidemia, unspecified: Secondary | ICD-10-CM | POA: Diagnosis present

## 2024-01-18 DIAGNOSIS — W19XXXA Unspecified fall, initial encounter: Secondary | ICD-10-CM

## 2024-01-18 DIAGNOSIS — M6282 Rhabdomyolysis: Secondary | ICD-10-CM | POA: Insufficient documentation

## 2024-01-18 DIAGNOSIS — R7989 Other specified abnormal findings of blood chemistry: Secondary | ICD-10-CM

## 2024-01-18 DIAGNOSIS — E1169 Type 2 diabetes mellitus with other specified complication: Secondary | ICD-10-CM

## 2024-01-18 DIAGNOSIS — R531 Weakness: Principal | ICD-10-CM

## 2024-01-18 DIAGNOSIS — F32A Depression, unspecified: Secondary | ICD-10-CM | POA: Diagnosis present

## 2024-01-18 DIAGNOSIS — E1129 Type 2 diabetes mellitus with other diabetic kidney complication: Secondary | ICD-10-CM | POA: Diagnosis present

## 2024-01-18 DIAGNOSIS — N390 Urinary tract infection, site not specified: Secondary | ICD-10-CM | POA: Insufficient documentation

## 2024-01-18 DIAGNOSIS — Z79899 Other long term (current) drug therapy: Secondary | ICD-10-CM | POA: Insufficient documentation

## 2024-01-18 DIAGNOSIS — R296 Repeated falls: Secondary | ICD-10-CM

## 2024-01-18 DIAGNOSIS — L239 Allergic contact dermatitis, unspecified cause: Secondary | ICD-10-CM | POA: Insufficient documentation

## 2024-01-18 DIAGNOSIS — I2510 Atherosclerotic heart disease of native coronary artery without angina pectoris: Secondary | ICD-10-CM | POA: Diagnosis present

## 2024-01-18 DIAGNOSIS — I5A Non-ischemic myocardial injury (non-traumatic): Secondary | ICD-10-CM | POA: Diagnosis present

## 2024-01-18 DIAGNOSIS — N1832 Chronic kidney disease, stage 3b: Secondary | ICD-10-CM | POA: Diagnosis present

## 2024-01-18 DIAGNOSIS — N179 Acute kidney failure, unspecified: Principal | ICD-10-CM | POA: Diagnosis present

## 2024-01-18 DIAGNOSIS — E1122 Type 2 diabetes mellitus with diabetic chronic kidney disease: Secondary | ICD-10-CM | POA: Insufficient documentation

## 2024-01-18 HISTORY — DX: Essential (primary) hypertension: I10

## 2024-01-18 HISTORY — DX: Chronic kidney disease, stage 3b: N18.32

## 2024-01-18 LAB — COMPREHENSIVE METABOLIC PANEL WITH GFR
ALT: 28 U/L (ref 0–44)
AST: 54 U/L — ABNORMAL HIGH (ref 15–41)
Albumin: 3.9 g/dL (ref 3.5–5.0)
Alkaline Phosphatase: 94 U/L (ref 38–126)
Anion gap: 10 (ref 5–15)
BUN: 23 mg/dL (ref 8–23)
CO2: 23 mmol/L (ref 22–32)
Calcium: 9.3 mg/dL (ref 8.9–10.3)
Chloride: 104 mmol/L (ref 98–111)
Creatinine, Ser: 2.07 mg/dL — ABNORMAL HIGH (ref 0.44–1.00)
GFR, Estimated: 23 mL/min — ABNORMAL LOW (ref >60)
Glucose, Bld: 176 mg/dL — ABNORMAL HIGH (ref 70–99)
Potassium: 5 mmol/L (ref 3.5–5.1)
Sodium: 137 mmol/L (ref 135–145)
Total Bilirubin: 0.4 mg/dL (ref 0.0–1.2)
Total Protein: 6.9 g/dL (ref 6.5–8.1)

## 2024-01-18 LAB — CBC
HCT: 32.9 % — ABNORMAL LOW (ref 36.0–46.0)
Hemoglobin: 10.6 g/dL — ABNORMAL LOW (ref 12.0–15.0)
MCH: 29.3 pg (ref 26.0–34.0)
MCHC: 32.2 g/dL (ref 30.0–36.0)
MCV: 90.9 fL (ref 80.0–100.0)
Platelets: 375 K/uL (ref 150–400)
RBC: 3.62 MIL/uL — ABNORMAL LOW (ref 3.87–5.11)
RDW: 17.4 % — ABNORMAL HIGH (ref 11.5–15.5)
WBC: 11.5 K/uL — ABNORMAL HIGH (ref 4.0–10.5)
nRBC: 0 % (ref 0.0–0.2)

## 2024-01-18 LAB — TROPONIN T, HIGH SENSITIVITY: Troponin T High Sensitivity: 52 ng/L — ABNORMAL HIGH (ref 0–19)

## 2024-01-18 MED ORDER — SODIUM CHLORIDE 0.9 % IV BOLUS
1000.0000 mL | Freq: Once | INTRAVENOUS | Status: AC
  Administered 2024-01-18: 1000 mL via INTRAVENOUS

## 2024-01-18 NOTE — ED Provider Notes (Signed)
 11:10 PM  Assumed care at shift change.  Patient here with generalized weakness, altered mental status.  Urine, labs pending.  Head CT reviewed and interpreted by myself and the radiologist and shows no acute abnormality.   12:21 AM  On my reevaluation patient denies any complaints other than restlessness and cramping of both of her legs.  She is moving them equally without bony deformity, joint effusion.  Compartments are soft.  No calf tenderness or calf swelling.  Extremities warm and well-perfused.  Will give Tylenol .  Will check magnesium and CK.  Labs show mild leukocytosis.  Chronic and stable anemia.  Creatinine elevated at 2.07.  Was 1.29 a month ago.  Troponin also elevated at 52.  Will check second troponin, continue to hydrate and give full dose aspirin .   Consulted and discussed patient's case with hospitalist, Dr. Hilma.  I have recommended admission and consulting physician agrees and will place admission orders.  Patient (and family if present) agree with this plan.   I reviewed all nursing notes, vitals, pertinent previous records.  All labs, EKGs, imaging ordered have been independently reviewed and interpreted by myself.   1:30 AM  Pt's CK is elevated at 719.  Getting NS at 75 mL/hr.   CRITICAL CARE Performed by: Josette Sink   Total critical care time: 30 minutes  Critical care time was exclusive of separately billable procedures and treating other patients.  Critical care was necessary to treat or prevent imminent or life-threatening deterioration.  Critical care was time spent personally by me on the following activities: development of treatment plan with patient and/or surrogate as well as nursing, discussions with consultants, evaluation of patient's response to treatment, examination of patient, obtaining history from patient or surrogate, ordering and performing treatments and interventions, ordering and review of laboratory studies, ordering and review of  radiographic studies, pulse oximetry and re-evaluation of patient's condition.    Peggyann Zwiefelhofer, Josette SAILOR, DO 01/19/24 [phone]

## 2024-01-18 NOTE — ED Triage Notes (Signed)
 Pt bib [facility] from home with complaints of multiple falls recently and family states altered mental status. Pt arrives AOx4 stating that she had leg weakness and was unable to stand from a chair without help. Also complains of bil leg pain for a couple days

## 2024-01-18 NOTE — ED Provider Notes (Signed)
 [facility] Provider Note    Event Date/Time   First MD Initiated Contact with Patient 01/18/24 2217     (approximate)  History   Chief Complaint: Fall  HPI  Laura Hatfield is a 83 y.o. female with a past medical history of CAD, diabetes, hyperlipidemia, presents to the emergency department with generalized weakness/falls.  According to EMS family states patient has had multiple recent falls.  Patient states she has not had multiple recent falls she did have 1 fall back in April, states yesterday and today she was having weakness but did not fall.  Patient is awake alert oriented x 4.  Patient denies any head injury.  No chest pain or shortness of breath.  Patient has had some urinary frequency.  Denies any cough or congestion.  Physical Exam   Triage Vital Signs: ED Triage Vitals [01/18/24 2155]  Encounter Vitals Group     BP (!) 131/90     Girls Systolic BP Percentile      Girls Diastolic BP Percentile      Boys Systolic BP Percentile      Boys Diastolic BP Percentile      Pulse Rate 94     Resp 16     Temp 98 F (36.7 C)     Temp src      SpO2 100 %     Weight      Height      Head Circumference      Peak Flow      Pain Score 6     Pain Loc      Pain Education      Exclude from Growth Chart     Most recent vital signs: Vitals:   01/18/24 2155  BP: (!) 131/90  Pulse: 94  Resp: 16  Temp: 98 F (36.7 C)  SpO2: 100%    General: Awake, no distress.  CV:  Good peripheral perfusion.  Regular rate and rhythm  Resp:  Normal effort.  Equal breath sounds bilaterally.  Abd:  No distention.  Soft, nontender.  No rebound or guarding. Other:  Wears an ankle brace on the left ankle which she states she has done this since her fall in April.  No increased pain to this area.   ED Results / Procedures / Treatments   EKG  EKG viewed and interpreted by myself shows sinus tachycardia 102 bpm with a narrow QRS, normal axis, normal intervals,  no concerning ST changes.  RADIOLOGY  I have reviewed and interpreted the CT head images.  No bleed seen on my evaluation. Radiology has read the CT scan as negative.   MEDICATIONS ORDERED IN ED: Medications  sodium chloride  0.9 % bolus 1,000 mL (has no administration in time range)     IMPRESSION / MDM / ASSESSMENT AND PLAN / ED COURSE  I reviewed the triage vital signs and the nursing notes.  Patient's presentation is most consistent with acute presentation with potential threat to life or bodily function.  Patient presents to the emergency department for generalized weakness yesterday and today.  Patient is awake alert oriented x 4.  She does states some urinary frequency recently which could be indicative of urinary tract infection.  CT scan of the head shows no acute finding.  Will check lab work we will check a urine sample we will obtain cardiac enzyme as a precaution we will IV hydrate and continue to closely monitor while awaiting results.  Patient agreeable to  plan of care.  Overall patient appears well on exam reassuring exam with reassuring vital signs.  Patient care signed out to oncoming provider labs pending.  Patient CBC shows no concerning findings, CT scan is negative for acute abnormality.  EKG shows no significant finding.  Patient receiving IV fluids urinalysis chemistry and troponin pending.  Patient care signed out to oncoming provider.  FINAL CLINICAL IMPRESSION(S) / ED DIAGNOSES   Weakness    Note:  This document was prepared using Dragon voice recognition software and may include unintentional dictation errors.   Dorothyann Drivers, MD 01/18/24 [phone]

## 2024-01-19 ENCOUNTER — Observation Stay

## 2024-01-19 ENCOUNTER — Encounter: Payer: Self-pay | Admitting: Internal Medicine

## 2024-01-19 DIAGNOSIS — F32A Depression, unspecified: Secondary | ICD-10-CM | POA: Diagnosis present

## 2024-01-19 DIAGNOSIS — N1832 Chronic kidney disease, stage 3b: Secondary | ICD-10-CM | POA: Diagnosis present

## 2024-01-19 DIAGNOSIS — W19XXXA Unspecified fall, initial encounter: Secondary | ICD-10-CM | POA: Diagnosis not present

## 2024-01-19 DIAGNOSIS — D72829 Elevated white blood cell count, unspecified: Secondary | ICD-10-CM | POA: Diagnosis present

## 2024-01-19 DIAGNOSIS — E1129 Type 2 diabetes mellitus with other diabetic kidney complication: Secondary | ICD-10-CM | POA: Diagnosis present

## 2024-01-19 DIAGNOSIS — I10 Essential (primary) hypertension: Secondary | ICD-10-CM | POA: Diagnosis present

## 2024-01-19 DIAGNOSIS — I5A Non-ischemic myocardial injury (non-traumatic): Secondary | ICD-10-CM | POA: Diagnosis not present

## 2024-01-19 DIAGNOSIS — Y92009 Unspecified place in unspecified non-institutional (private) residence as the place of occurrence of the external cause: Secondary | ICD-10-CM

## 2024-01-19 DIAGNOSIS — N179 Acute kidney failure, unspecified: Secondary | ICD-10-CM | POA: Diagnosis not present

## 2024-01-19 LAB — URINALYSIS, COMPLETE (UACMP) WITH MICROSCOPIC
Bilirubin Urine: NEGATIVE
Glucose, UA: NEGATIVE mg/dL
Hgb urine dipstick: NEGATIVE
Ketones, ur: 5 mg/dL — AB
Nitrite: NEGATIVE
Protein, ur: 30 mg/dL — AB
Specific Gravity, Urine: 1.024 (ref 1.005–1.030)
pH: 5 (ref 5.0–8.0)

## 2024-01-19 LAB — BASIC METABOLIC PANEL WITH GFR
Anion gap: 10 (ref 5–15)
BUN: 25 mg/dL — ABNORMAL HIGH (ref 8–23)
CO2: 20 mmol/L — ABNORMAL LOW (ref 22–32)
Calcium: 8.7 mg/dL — ABNORMAL LOW (ref 8.9–10.3)
Chloride: 107 mmol/L (ref 98–111)
Creatinine, Ser: 2.02 mg/dL — ABNORMAL HIGH (ref 0.44–1.00)
GFR, Estimated: 24 mL/min — ABNORMAL LOW (ref >60)
Glucose, Bld: 144 mg/dL — ABNORMAL HIGH (ref 70–99)
Potassium: 4.6 mmol/L (ref 3.5–5.1)
Sodium: 137 mmol/L (ref 135–145)

## 2024-01-19 LAB — CBG MONITORING, ED
Glucose-Capillary: 107 mg/dL — ABNORMAL HIGH (ref 70–99)
Glucose-Capillary: 129 mg/dL — ABNORMAL HIGH (ref 70–99)
Glucose-Capillary: 134 mg/dL — ABNORMAL HIGH (ref 70–99)
Glucose-Capillary: 141 mg/dL — ABNORMAL HIGH (ref 70–99)
Glucose-Capillary: 141 mg/dL — ABNORMAL HIGH (ref 70–99)
Glucose-Capillary: 145 mg/dL — ABNORMAL HIGH (ref 70–99)
Glucose-Capillary: 65 mg/dL — ABNORMAL LOW (ref 70–99)
Glucose-Capillary: 93 mg/dL (ref 70–99)

## 2024-01-19 LAB — TROPONIN T, HIGH SENSITIVITY
Troponin T High Sensitivity: 48 ng/L — ABNORMAL HIGH (ref 0–19)
Troponin T High Sensitivity: 49 ng/L — ABNORMAL HIGH (ref 0–19)
Troponin T High Sensitivity: 48 ng/L — ABNORMAL HIGH (ref 0–19)

## 2024-01-19 LAB — CK: Total CK: 719 U/L — ABNORMAL HIGH (ref 38–234)

## 2024-01-19 LAB — TSH: TSH: 2.01 u[IU]/mL (ref 0.350–4.500)

## 2024-01-19 LAB — MAGNESIUM: Magnesium: 2.1 mg/dL (ref 1.7–2.4)

## 2024-01-19 MED ORDER — ASPIRIN 81 MG PO CHEW
324.0000 mg | CHEWABLE_TABLET | Freq: Once | ORAL | Status: AC
  Administered 2024-01-19: 324 mg via ORAL
  Filled 2024-01-19: qty 4

## 2024-01-19 MED ORDER — SODIUM CHLORIDE 0.9 % IV SOLN: INTRAVENOUS | Status: DC

## 2024-01-19 MED ORDER — SODIUM CHLORIDE 0.9 % IV SOLN: INTRAVENOUS | Status: AC

## 2024-01-19 MED ORDER — METHOCARBAMOL 500 MG PO TABS: 500.0000 mg | ORAL_TABLET | Freq: Three times a day (TID) | ORAL | Status: DC | PRN

## 2024-01-19 MED ORDER — ASPIRIN 81 MG PO TBEC
81.0000 mg | DELAYED_RELEASE_TABLET | Freq: Every day | ORAL | Status: DC
  Administered 2024-01-20 – 2024-01-25 (×6): 81 mg via ORAL
  Filled 2024-01-19 (×6): qty 1

## 2024-01-19 MED ORDER — ONDANSETRON HCL 4 MG/2ML IJ SOLN
4.0000 mg | Freq: Three times a day (TID) | INTRAMUSCULAR | Status: DC | PRN
  Administered 2024-01-22 – 2024-01-25 (×2): 4 mg via INTRAVENOUS
  Filled 2024-01-19 (×2): qty 2

## 2024-01-19 MED ORDER — ESCITALOPRAM OXALATE 10 MG PO TABS
5.0000 mg | ORAL_TABLET | Freq: Every day | ORAL | Status: DC
  Administered 2024-01-19 – 2024-01-25 (×7): 5 mg via ORAL
  Filled 2024-01-19 (×7): qty 1

## 2024-01-19 MED ORDER — OXYCODONE-ACETAMINOPHEN 5-325 MG PO TABS: 1.0000 | ORAL_TABLET | ORAL | Status: DC | PRN

## 2024-01-19 MED ORDER — INSULIN ASPART 100 UNIT/ML IJ SOLN
0.0000 [IU] | Freq: Every day | INTRAMUSCULAR | Status: DC
  Filled 2024-01-19: qty 2

## 2024-01-19 MED ORDER — ACETAMINOPHEN 500 MG PO TABS
1000.0000 mg | ORAL_TABLET | Freq: Once | ORAL | Status: AC
  Administered 2024-01-19: 1000 mg via ORAL
  Filled 2024-01-19: qty 2

## 2024-01-19 MED ORDER — NITROGLYCERIN 0.4 MG SL SUBL: 0.4000 mg | SUBLINGUAL_TABLET | SUBLINGUAL | Status: DC | PRN

## 2024-01-19 MED ORDER — HEPARIN SODIUM (PORCINE) 5000 UNIT/ML IJ SOLN
5000.0000 [IU] | Freq: Three times a day (TID) | INTRAMUSCULAR | Status: DC
  Administered 2024-01-19 – 2024-01-25 (×20): 5000 [IU] via SUBCUTANEOUS
  Filled 2024-01-19 (×20): qty 1

## 2024-01-19 MED ORDER — INSULIN ASPART 100 UNIT/ML IJ SOLN
0.0000 [IU] | Freq: Three times a day (TID) | INTRAMUSCULAR | Status: DC
  Administered 2024-01-19 (×2): 1 [IU] via SUBCUTANEOUS
  Administered 2024-01-20: 2 [IU] via SUBCUTANEOUS
  Administered 2024-01-20: 1 [IU] via SUBCUTANEOUS
  Administered 2024-01-20: 2 [IU] via SUBCUTANEOUS
  Administered 2024-01-21 (×2): 1 [IU] via SUBCUTANEOUS
  Administered 2024-01-21: 3 [IU] via SUBCUTANEOUS
  Administered 2024-01-22: 1 [IU] via SUBCUTANEOUS
  Administered 2024-01-22: 3 [IU] via SUBCUTANEOUS
  Administered 2024-01-22: 1 [IU] via SUBCUTANEOUS
  Administered 2024-01-23 (×2): 2 [IU] via SUBCUTANEOUS
  Administered 2024-01-24: 3 [IU] via SUBCUTANEOUS
  Administered 2024-01-24: 2 [IU] via SUBCUTANEOUS
  Administered 2024-01-24: 1 [IU] via SUBCUTANEOUS
  Administered 2024-01-25 (×3): 2 [IU] via SUBCUTANEOUS
  Filled 2024-01-19: qty 2
  Filled 2024-01-19 (×2): qty 1
  Filled 2024-01-19: qty 2
  Filled 2024-01-19: qty 3
  Filled 2024-01-19: qty 2
  Filled 2024-01-19 (×4): qty 1
  Filled 2024-01-19: qty 3
  Filled 2024-01-19 (×2): qty 2
  Filled 2024-01-19: qty 3
  Filled 2024-01-19: qty 2
  Filled 2024-01-19: qty 1
  Filled 2024-01-19: qty 2

## 2024-01-19 MED ORDER — HYDRALAZINE HCL 20 MG/ML IJ SOLN: 5.0000 mg | INTRAMUSCULAR | Status: DC | PRN

## 2024-01-19 MED ORDER — INSULIN ASPART PROT & ASPART (70-30 MIX) 100 UNIT/ML ~~LOC~~ SUSP
30.0000 [IU] | Freq: Two times a day (BID) | SUBCUTANEOUS | Status: DC
  Administered 2024-01-19: 30 [IU] via SUBCUTANEOUS
  Filled 2024-01-19: qty 10

## 2024-01-19 MED ORDER — SODIUM CHLORIDE 0.9 % IV BOLUS (SEPSIS): 500.0000 mL | Freq: Once | INTRAVENOUS | Status: DC

## 2024-01-19 MED ORDER — ACETAMINOPHEN 325 MG PO TABS
650.0000 mg | ORAL_TABLET | Freq: Four times a day (QID) | ORAL | Status: DC | PRN
  Administered 2024-01-21: 650 mg via ORAL
  Filled 2024-01-19 (×2): qty 2

## 2024-01-19 NOTE — ED Notes (Signed)
 Pt working with OT and was able to stand and pivot to [facility] w/ supervision. When trying to have a BM, pt had syncopal episode. BP when sitting was 133/111 w/ HR 65. RN and OT staff moved pt to bed and BP was 126/57 with HR 85. CBG was 126. Pt responded to painful stimuli on BSC and progressively went back to baseline in about 2 minutes. Pt doesn't remember the event but has not complaints besides fatigue. A&Ox4. Equal grip strength and movement. MD and CN notified

## 2024-01-19 NOTE — ED Notes (Addendum)
 Pt sleeping at this time.

## 2024-01-19 NOTE — ED Notes (Signed)
 Assisted the pt to the bedside commode, 1 person assist/contact guard. Her bedding was wet so I also did a full linen change and gown change. Assisted back to the bed and given 3 warm blankets and extra pillow. Falls bundle in place. No other needs at this time.

## 2024-01-19 NOTE — ED Notes (Signed)
 Upon assessment, this pt was found to have increased confusion and only alert to self. On call admitting MD made aware

## 2024-01-19 NOTE — Progress Notes (Signed)
  PROGRESS NOTE    Laura Hatfield  FMW:[id number] DOB: Aug 13, 1940 DOA: 01/18/2024 PCP: Treasa Pin, MD  ED32A/ED32A  LOS: 0 days   Brief hospital course:   Assessment & Plan: Laura Hatfield is a 83 y.o. female with medical history significant of HTN, HLD, CAD, CKD-3b, peripheral neuropathy, UTI, depression, statin allergy, who presents with fall, weakness and upper leg pain.    Fall at home, initial encounter:  Weakness  --PT/OT   Acute renal failure superimposed on stage 3b chronic kidney disease (HCC): --possible dehydration and rhabo. --cont gentle MIVF  Rhabo --pt said she was on the ground for about 4 hours after the fall --cont gentle MIVF  HLD (hyperlipidemia) -Patient is evolocumab at home   Type II diabetes mellitus with renal manifestations (HCC):  Hypoglycemia Recent A1c 6.6 well-controlled.  Patient states that she is taking 70/30 insulin  42 units twice a day and 2 unit in nighttime --pt received 70/30 insulin  30 units and BG dropped to 65 later.   --d/c 70/30 insulin  --ACHS and resume insulin  per BG readings and diabetic coordinator rec   Leukocytosis:  WBC 11.5, no fever.  Likely reactive --monitor   HTN (hypertension):  Patient is not taking medications.  Blood pressure 151/79 -IV hydralazine  as needed   Depression - Lexapro   Troponin elevation, mild --trop 52 flat, no CP, likely demand ischemia.   hx of CAD:  --pt is allergic to statin, and is on Evolocumab injection  --cont ASA   DVT prophylaxis: Heparin  SQ Code Status: Full code  Family Communication:  Level of care: Telemetry Dispo:   The patient is from: home Anticipated d/c is to: to be determined  Anticipated d/c date is: 1-2 days   Subjective and Interval History:  Per ED RN, pt had a brief syncope episode while having a BM.  Recovered quickly with good mental status.  Pt reported having sore thighs.   Objective: Vitals:   01/19/24 1343 01/19/24 1510 01/19/24  1604 01/19/24 1700  BP: (!) 126/57  (!) 143/100 132/64  Pulse: 84  (!) 103 99  Resp: 16  19 15   Temp:   99.3 F (37.4 C)   TempSrc:   Oral   SpO2: 100% 100% 100% 100%    Intake/Output Summary (Last 24 hours) at 01/19/2024 1958 Last data filed at 01/19/2024 9561 Gross per 24 hour  Intake --  Output 200 ml  Net -200 ml   There were no vitals filed for this visit.  Examination:   Constitutional: NAD, AAOx3 HEENT: conjunctivae and lids normal, EOMI CV: No cyanosis.   RESP: normal respiratory effort, on RA Neuro: II - XII grossly intact.   Psych: Normal mood and affect.  Appropriate judgement and reason   Data Reviewed: I have personally reviewed labs and imaging studies   Ellouise Haber, MD [facility] If 7PM-7AM, please contact night-coverage 01/19/2024, 7:58 PM

## 2024-01-19 NOTE — ED Notes (Signed)
 Pt able to stand and pivot from stretcher to toilet w/ two assist

## 2024-01-19 NOTE — ED Notes (Signed)
 CCMD called to place pt on monitor

## 2024-01-19 NOTE — Evaluation (Signed)
 Occupational Therapy Evaluation Patient Details Name: Laura Hatfield MRN: [id number] DOB: [birth year] Today's Date: 01/19/2024   History of Present Illness   Laura Hatfield is a 83 y.o. female with a past medical history of CAD, diabetes, hyperlipidemia, presents to the emergency department with generalized weakness/falls.     Clinical Impressions MS. Garman was seen for OT evaluation this date. Prior to hospital admission, pt was generally independent with ADL management, with family assisting her PRN with IADL tasks. Pt states she was active with HH OT/PT and working on strengthening and transfer safety. She uses a WC for most mobility at baseline, but can perform functional transfers with a RW. Pt lives with her cousin in a 1 level home with ~ 6 STE. Pt presents with deficits in strength, cognition, activity tolerance, and balance, affecting safe and optimal ADL completion.   Of note, pt able to transfer safely to [facility] with CGA for safety during session, once on [facility] pt is able to answer questions and respond appropriately. After ~2 min on the [facility] she is noted to pass gas several times then becomes unresponsive. Does not wake to verbal/tactile cues and requires TOTAL assist to remain safely upright on BSC. RN notified and in room to assess (see NSG documentation). OT/RN assist pt safely back to bed (+2 total assist) where she does become more alert and again able to answer questions appropriately. RN notifies care team during session.   Pt would benefit from skilled OT services to address noted impairments and functional limitations (see below for any additional details) in order to maximize safety and independence while minimizing future risk of falls, injury, and readmission. Anticipate the need for follow up OT services upon acute hospital DC.      If plan is discharge home, recommend the following:   Two people to help with walking and/or transfers;A lot of help with  bathing/dressing/bathroom;Supervision due to cognitive status;Help with stairs or ramp for entrance;Assist for transportation;Assistance with cooking/housework     Functional Status Assessment   Patient has had a recent decline in their functional status and demonstrates the ability to make significant improvements in function in a reasonable and predictable amount of time.     Equipment Recommendations   None recommended by OT     Recommendations for Other Services         Precautions/Restrictions   Precautions Precautions: Fall Recall of Precautions/Restrictions: Impaired Precaution/Restrictions Comments: does wear brace on RLE, pt reports this is baseline. Restrictions Weight Bearing Restrictions Per Provider Order: No     Mobility Bed Mobility Overal bed mobility: Needs Assistance Bed Mobility: Supine to Sit     Supine to sit: Supervision, HOB elevated, Used rails     General bed mobility comments: Increased time/effort to perform.    Transfers Overall transfer level: Needs assistance Equipment used: 2 person hand held assist Transfers: Bed to chair/wheelchair/BSC     Squat pivot transfers: Total assist, +2 physical assistance              Balance Overall balance assessment: Needs assistance   Sitting balance-Leahy Scale: Good Sitting balance - Comments: steady static sitting at EOB prior to transfer. Remains high falls risk.     Standing balance-Leahy Scale: Fair                             ADL either performed or assessed with clinical judgement   ADL Overall ADL's : Needs  assistance/impaired                                       General ADL Comments: Pt requires SUPERVISION for bed mobility and to SPT to [facility]. MOD A to don hospital sock over LLE. Once on [facility] however, she becomes unresponsive, and requires +3 TOTAL assist back to bed. BP when sitting was 133/111 w/ HR 65. Supine BP:126/57 with HR 85. Pt does  begin to respond to painful stimuli (sternal rub) on BSC, once back to bed she becomes more alert. Denies dizziness, weakness, or sensory deficits, but does remain somewhat lethargic. Further functional activity limited and pt left with RN for further assessment once stafely back to bed.     Vision Patient Visual Report: No change from baseline       Perception         Praxis         Pertinent Vitals/Pain Pain Assessment Pain Assessment: No/denies pain     Extremity/Trunk Assessment Upper Extremity Assessment Upper Extremity Assessment: Generalized weakness   Lower Extremity Assessment Lower Extremity Assessment: Generalized weakness       Communication Communication Communication: No apparent difficulties   Cognition Arousal: Lethargic, Alert Behavior During Therapy: WFL for tasks assessed/performed Cognition: No family/caregiver present to determine baseline             OT - Cognition Comments: Pt oriented to self, place, and situation (did not assess for date) at start of session. She is conversational and able to discuss home set up information. Once up on [facility] pt noted to have syncopal episode, she became lethargic, does not respond to verbal/tactile stim. RN in to assess. See nsg doccumentation.                         Cueing  General Comments          Exercises Other Exercises Other Exercises: Pt educated on role of OT in acute setting, falls prevention strategies, & safe transfer technique.   Shoulder Instructions      Home Living Family/patient expects to be discharged to:: Private residence Living Arrangements: Other relatives (Lives with cousin) Available Help at Discharge: Family;Available PRN/intermittently Type of Home: House Home Access: Stairs to enter [employer] of Steps: 7 Entrance Stairs-Rails: Right;Left;Can reach both Home Layout: One level         Firefighter: Standard     Home Equipment: Clinical Biochemist (2 wheels);Toilet riser;Wheelchair - manual;BSC/3in1;Grab bars - toilet;Grab bars - tub/shower;Hand held shower head          Prior Functioning/Environment Prior Level of Function : Independent/Modified Independent;History of Falls (last six months)             Mobility Comments: Reports recently working with [facility] therapy. Uses WC for mobility, was also ambulatory with RW. ADLs Comments: generally MOD I with ADL; intermittent assist for IADL, orders groceries, has a cleaning service    OT Problem List: Decreased strength;Decreased coordination;Decreased range of motion;Decreased cognition;Decreased activity tolerance;Decreased safety awareness;Decreased knowledge of use of DME or AE;Impaired balance (sitting and/or standing)   OT Treatment/Interventions: Self-care/ADL training;Therapeutic exercise;Therapeutic activities;Balance training;DME and/or AE instruction;Patient/family education;Energy conservation      OT Goals(Current goals can be found in the care plan section)   Acute Rehab OT Goals Patient Stated Goal: To feel better OT Goal Formulation: With patient Time For  Goal Achievement: 02/02/24 Potential to Achieve Goals: Good ADL Goals Pt Will Perform Grooming: sitting;with modified independence Pt Will Perform Lower Body Dressing: sit to/from stand;with supervision;with set-up Pt Will Transfer to Toilet: bedside commode;with set-up;with supervision Pt Will Perform Toileting - Clothing Manipulation and hygiene: sit to/from stand;with supervision;with set-up;with adaptive equipment   OT Frequency:  Min 1X/week    Co-evaluation              AM-PAC OT 6 Clicks Daily Activity     Outcome Measure Help from another person eating meals?: A Little Help from another person taking care of personal grooming?: A Little Help from another person toileting, which includes using toliet, bedpan, or urinal?: A Lot Help from another person bathing (including washing,  rinsing, drying)?: A Lot Help from another person to put on and taking off regular upper body clothing?: A Little Help from another person to put on and taking off regular lower body clothing?: A Lot 6 Click Score: 15   End of Session Equipment Utilized During Treatment: Gait belt Nurse Communication: Mobility status  Activity Tolerance: Patient tolerated treatment well Patient left: in bed;with call bell/phone within reach;with bed alarm set (with RN in room.)  OT Visit Diagnosis: Repeated falls (R29.6);Muscle weakness (generalized) (M62.81)                Time: 8686-8654 OT Time Calculation (min): 32 min Charges:  OT General Charges $OT Visit: 1 Visit OT Evaluation $OT Eval Moderate Complexity: 1 Mod OT Treatments $Self Care/Home Management : 8-22 mins  Jhonny Pelton, M.S., OTR/L 01/19/24, 3:01 PM

## 2024-01-19 NOTE — TOC Initial Note (Signed)
 Transition of Care [facility]) - Initial/Assessment Note    Patient Details  Name: Laura Hatfield MRN: [id number] Date of Birth: 1941/01/02  [facility]) CM/SW Contact:    Nathanael CHRISTELLA Ring, RN Phone Number: 01/19/2024, 3:02 PM  Clinical Narrative:                  Patient is from home where she lives with her cousin, Arland.  She has another cousin, Koren that lives next door to her.  She says that at home she mostly uses her wheelchair to navigate the home but she also has a cane and walker.  She has been having frequent falls at home.  She has been seen by OT but no note in yet.  She has had HH before with [facility] and would like to use them again if [facility] recommended.  She has also been to SNF for rehab at [facility] she would like to go to them if SNF recommended.   If she goes home she says that Koren could pick her up.   Expected Discharge Plan: Home w Home Health Services Barriers to Discharge: Continued Medical Work up   Patient Goals and CMS Choice Patient states their goals for this hospitalization and ongoing recovery are:: Patient wants to get back on her feet and will do whatever therapy recommends, home health or SNF CMS Medicare.gov Compare Post Acute Care list provided to:: Patient Choice offered to / list presented to : Patient      Expected Discharge Plan and Services   Discharge Planning Services: CM Consult Post Acute Care Choice: Home Health, Skilled Nursing Facility Living arrangements for the past 2 months: Single Family Home                                      Prior Living Arrangements/Services Living arrangements for the past 2 months: Single Family Home Lives with:: Relatives Patient language and need for interpreter reviewed:: Yes Do you feel safe going back to the place where you live?: Yes      Need for Family Participation in Patient Care: Yes (Comment) Care giver support system in place?: Yes (comment) Current home services:  DME Elene, RW, Wheelchair, [facility]) Criminal Activity/Legal Involvement Pertinent to Current Situation/Hospitalization: No - Comment as needed  Activities of Daily Living      Permission Sought/Granted Permission sought to share information with : Facility Industrial/product Designer granted to share information with : Yes, Verbal Permission Granted     Permission granted to share info w AGENCY: Home health or SNF depending on recommendations        Emotional Assessment Appearance:: Appears stated age Attitude/Demeanor/Rapport: Engaged Affect (typically observed): Accepting Orientation: : Oriented to Self, Oriented to Place, Oriented to  Time, Oriented to Situation Alcohol / Substance Use: Not Applicable Psych Involvement: No (comment)  Admission diagnosis:  Myocardial injury [I5A] Patient Active Problem List   Diagnosis Date Noted   Myocardial injury 01/19/2024   Type II diabetes mellitus with renal manifestations (HCC) 01/19/2024   Fall at home, initial encounter 01/19/2024   Leukocytosis 01/19/2024   Acute renal failure superimposed on stage 3b chronic kidney disease (HCC) 01/19/2024   HTN (hypertension) 01/19/2024   Depression 01/19/2024   Sepsis secondary to UTI (HCC) 06/07/2023   Closed left ankle fracture 06/07/2023   Hypertensive urgency 07/26/2017   Aphasia 05/01/2017   HLD (hyperlipidemia) 05/01/2017  Diabetes (HCC) 05/01/2017   CAD (coronary artery disease) 05/01/2017   Stage 3b chronic kidney disease (HCC) 05/01/2017   TIA (transient ischemic attack) 03/21/2016   PCP:  Treasa Pin, MD Pharmacy:   [facility] [address] [place] - [address] [place] [address] Phone: [phone] Fax: [phone]  [id number], [place] - [address] AT [facility] [address] [place] [address] Phone: [phone] Fax: [phone]     Social Drivers of Health (SDOH) Social  History: SDOH Screenings   Food Insecurity: No Food Insecurity (11/20/2023)   Received from [facility]  Housing: Low Risk  (11/20/2023)   Received from [facility]  Transportation Needs: No Transportation Needs (11/20/2023)   Received from [facility]  Utilities: Not At Risk (11/20/2023)   Received from [facility]  Alcohol Screen: Low Risk  (03/29/2018)  Financial Resource Strain: Low Risk  (11/20/2023)   Received from [facility]  Social Connections: Unknown (06/08/2023)  Tobacco Use: Low Risk  (01/01/2024)   Received from [facility]   SDOH Interventions:     Readmission Risk Interventions     No data to display

## 2024-01-19 NOTE — ED Notes (Signed)
 Will let pt eat dinner that is at bedside and reevaluate her CBG

## 2024-01-19 NOTE — Inpatient Diabetes Management (Addendum)
 Inpatient Diabetes Program Recommendations  AACE/ADA: New Consensus Statement on Inpatient Glycemic Control  Target Ranges:  Prepandial:   less than 140 mg/dL      Peak postprandial:   less than 180 mg/dL (1-2 hours)      Critically ill patients:  140 - 180 mg/dL    Latest Reference Range & Units 01/19/24 00:43 01/19/24 06:07 01/19/24 07:49  Glucose-Capillary 70 - 99 mg/dL 858 (H) 858 (H) 865 (H)    Latest Reference Range & Units 06/04/23 17:41  Hemoglobin A1C 4.8 - 5.6 % 6.6 (H)   Review of Glycemic Control  Diabetes history: DM2 Outpatient Diabetes medications: 70/30 42 units QAM with breakfast, 2 units QPM with supper, Semglee  18 units QHS Current orders for Inpatient glycemic control: 70/30 30 units BID, Novolog  0-9 units TID with meals, Novolog  0-5 units QHS  Inpatient Diabetes Program Recommendations:    Insulin : Please discontinue 70/30 insulin  and order Semglee  12 units Q24H (to start at 10:00 am on 01/20/24).   NOTE: Patient admitted with myocardial injury, fall at home, acute renal failure on CKD. Per chart review, patient sees Dr. Cherilyn and was last seen 08/21/23 and per office note patient was taking 70/30 42 units QAM with breakfast and 2 units QPM with supper and no changes were made at appointment. Current home medication list has 70/30 insulin  and Semglee  insulin . Patient given 70/30 30 units at 9:48 am today.  Patient was inpatient in April 2025 and was ordered insulin  glargine 12 units daily and Novolog  correction.  Would recommend to discontinue 70/30 insulin  and order Semglee  12 units daily to start tomorrow at 10am.   Addendum 01/19/24@13 :35-Went to see patient in ED at bedside. Patient up on bedside commode with OT at side. Patient appeared to have a syncopal episode and had to be assisted back to bed. RN, OT, and charge nurse at bedside. Asked that CBG be checked and it was 129 mg/dl at the time. Will plan to follow up with patient tomorrow to get more information  about outpatient DM medications she is taking. Sent chat message to MD to make aware our team will follow up with patient tomorrow.  Thanks, Earnie Gainer, RN, MSN, CDCES Diabetes Coordinator Inpatient Diabetes Program [phone] (Team Pager from 8am to 5pm)

## 2024-01-19 NOTE — H&P (Signed)
 History and Physical    Laura Hatfield FMW:[id number] DOB: 06/28/1940 DOA: 01/18/2024  Referring MD/NP/PA:   PCP: Treasa Pin, MD   Patient coming from:  The patient is coming from home.     Chief Complaint: fall, weakness and upper leg pain  HPI: Laura Hatfield is a 83 y.o. female with medical history significant of HTN, HLD, CAD, CKD-3b, peripheral neuropathy, UTI, depression, statin allergy, who presents with fall, weakness and upper leg pain.  According to EMS, family states patient has had multiple recent falls, but pt states that she just had one fall recently and fall in April which caused left ankle fracture. She is wearing brace in left ankle. She reports pain in bilateral upper legs with muscle spasm.  She has peripheral neuropathy with numbness in both lower legs.  No facial droop or slurred speech.  Patient does not have chest pain, cough, SOB.  No nausea, vomiting, diarrhea or abdominal pain.  She states that she has chronic urinary frequency, but no dysuria or burning with urination.  Patient is alert and oriented x 3.  Data reviewed independently and ED Course: pt was found to have WBC 11.5, troponin 52, worsening renal function with creatinine 2.07, BUN 23 and a GFR 23 (recent baseline creatinine 1.29 on 12/21/2023). Temperature normal, blood pressure 151/79, heart rate 99, RR 16, oxygen saturation 100% on room air.  Patient is placed in telemetry bed for observation.  CT-head: 1. No acute intracranial abnormality. 2. Atrophy and chronic small vessel disease throughout the deep white matter. 3. 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   EKG: I have personally reviewed.  Sinus rhythm, QTc 492, Q wave in lead III, nonspecific T wave change.   Review of Systems:   General: no fevers, chills, no body weight gain, has fatigue HEENT: no blurry vision, hearing changes or sore throat Respiratory: no dyspnea, coughing, wheezing CV: no chest pain, no  palpitations GI: no nausea, vomiting, abdominal pain, diarrhea, constipation GU: no dysuria, burning on urination, has increased urinary frequency, no hematuria  Ext: no leg edema Neuro: no unilateral weakness, numbness, or tingling, no vision change or hearing loss. Has fall. Skin: no rash, no skin tear. MSK: No muscle spasm, no deformity, no limitation of range of movement in spin. Has pain in bilateral upper thigh Heme: No easy bruising.  Travel history: No recent long distant travel.   Allergy:  Allergies  Allergen Reactions   Atorvastatin Other (See Comments)    Dr told her taking these were affecting her liver  Other Reaction(s): Muscle Pain, Not available   Losartan Other (See Comments)    Syncope   Crestor  [Rosuvastatin ]    Lipitor [Atorvastatin Calcium ]    Ezetimibe Other (See Comments)    Arthralgia   Lisinopril Cough   Penicillins Rash         Past Medical History:  Diagnosis Date   CAD (coronary artery disease)    Chronic renal failure, stage 3b (HCC)    Diabetes mellitus without complication (HCC)    HTN (hypertension)    Hyperlipidemia    Thyroid nodule     Past Surgical History:  Procedure Laterality Date   ANKLE SURGERY     CHOLECYSTECTOMY     EYE SURGERY     HUMERUS SURGERY      Social History:  reports that she has never smoked. She has never used smokeless tobacco. She reports that she does not drink alcohol and does not use drugs.  Family History:  Family History  Problem Relation Age of Onset   Hypertension Mother    Diabetes Mother    Hypertension Father    Stroke Father      Prior to Admission medications   Medication Sig Start Date End Date Taking? Authorizing Provider  acetaminophen  (TYLENOL ) 500 MG tablet Take 500 mg by mouth every 6 (six) hours as needed.    [provider]  aspirin  EC 81 MG tablet Take 81 mg by mouth daily.    [provider]  cholecalciferol  (VITAMIN D ) 1000 units tablet Take 1,000 Units by  mouth at bedtime.     [provider]  colestipol  (COLESTID ) 1 g tablet Take 2 tablets (2 g total) by mouth 2 (two) times daily as needed. Home med. Patient not taking: Reported on 11/11/2023 06/15/23 09/13/23  Awanda City, MD  Continuous Glucose Sensor (FREESTYLE LIBRE 2 SENSOR) MISC  09/03/23   [provider]  cyanocobalamin  (VITAMIN B12) 1000 MCG tablet Take 1,000 mcg by mouth daily.    [provider]  insulin  glargine-yfgn (SEMGLEE ) 100 UNIT/ML injection Inject 0.12 mLs (12 Units total) into the skin at bedtime. 06/15/23   Awanda City, MD  polyethylene glycol (MIRALAX  / GLYCOLAX ) 17 g packet Take 17 g by mouth daily. 06/16/23   Awanda City, MD  escitalopram  (LEXAPRO ) 5 MG tablet Take 5 mg by mouth daily. 12/15/17 12/14/19  [provider]  furosemide  (LASIX ) 40 MG tablet Take 60 mg by mouth daily as needed for fluid.  01/04/15 12/14/19  [provider]  gabapentin  (NEURONTIN ) 100 MG capsule Take 100 mg by mouth at bedtime as needed (for pain).   12/14/19  [provider]  metoprolol  succinate (TOPROL -XL) 100 MG 24 hr tablet Take 150 mg by mouth at bedtime.   12/14/19  [provider]  rosuvastatin  (CRESTOR ) 40 MG tablet Take 20 mg by mouth at bedtime.   01/24/19  [provider]    Physical Exam: Vitals:   01/18/24 2155 01/18/24 2300 01/18/24 2330  BP: (!) 131/90 135/82 (!) 151/79  Pulse: 94 99 96  Resp: 16  16  Temp: 98 F (36.7 C)    SpO2: 100% 98% 100%   General: Not in acute distress HEENT:       Eyes: PERRL, EOMI, no jaundice       ENT: No discharge from the ears and nose, no pharynx injection, no tonsillar enlargement.        Neck: No JVD, no bruit, no mass felt. Heme: No neck lymph node enlargement. Cardiac: S1/S2, RRR, No murmurs, No gallops or rubs. Respiratory: No rales, wheezing, rhonchi or rubs. GI: Soft, nondistended, nontender, no rebound pain, no organomegaly, BS present. GU: No hematuria Ext: No  pitting leg edema bilaterally. 1+DP/PT pulse bilaterally. Musculoskeletal: has tenderness in bilateral upper thigh. Skin: No rashes.  Neuro: Alert, oriented X3, cranial nerves II-XII grossly intact, moves all extremities, with generalized weakness. Psych: Patient is not psychotic, no suicidal or hemocidal ideation.  Labs on Admission: I have personally reviewed following labs and imaging studies  CBC: Recent Labs  Lab 01/18/24 2307  WBC 11.5*  HGB 10.6*  HCT 32.9*  MCV 90.9  PLT 375   Basic Metabolic Panel: Recent Labs  Lab 01/18/24 2307 01/19/24 0032  NA 137  --   K 5.0  --   CL 104  --   CO2 23  --   GLUCOSE 176*  --   BUN 23  --  CREATININE 2.07*  --   CALCIUM  9.3  --   MG  --  2.1   GFR: CrCl cannot be calculated (Unknown ideal weight.). Liver Function Tests: Recent Labs  Lab 01/18/24 2307  AST 54*  ALT 28  ALKPHOS 94  BILITOT 0.4  PROT 6.9  ALBUMIN 3.9   No results for input(s): LIPASE, AMYLASE in the last 168 hours. No results for input(s): AMMONIA in the last 168 hours. Coagulation Profile: No results for input(s): INR, PROTIME in the last 168 hours. Cardiac Enzymes: Recent Labs  Lab 01/19/24 0032  CKTOTAL 719*   BNP (last 3 results) No results for input(s): PROBNP in the last 8760 hours. HbA1C: No results for input(s): HGBA1C in the last 72 hours. CBG: Recent Labs  Lab 01/19/24 0043  GLUCAP 141*   Lipid Profile: No results for input(s): CHOL, HDL, LDLCALC, TRIG, CHOLHDL, LDLDIRECT in the last 72 hours. Thyroid Function Tests: Recent Labs    01/19/24 0032  TSH 2.010   Anemia Panel: No results for input(s): VITAMINB12, FOLATE, FERRITIN, TIBC, IRON, RETICCTPCT in the last 72 hours. Urine analysis:    Component Value Date/Time   COLORURINE YELLOW (A) 09/21/2023 1554   APPEARANCEUR CLOUDY (A) 09/21/2023 1554   APPEARANCEUR CLOUDY 01/20/2014 1357   LABSPEC 1.019 09/21/2023 1554   LABSPEC 1.020  01/20/2014 1357   PHURINE 5.0 09/21/2023 1554   GLUCOSEU NEGATIVE 09/21/2023 1554   GLUCOSEU NEGATIVE 01/20/2014 1357   HGBUR SMALL (A) 09/21/2023 1554   BILIRUBINUR NEGATIVE 09/21/2023 1554   BILIRUBINUR NEGATIVE 01/20/2014 1357   KETONESUR 20 (A) 09/21/2023 1554   PROTEINUR 100 (A) 09/21/2023 1554   NITRITE NEGATIVE 09/21/2023 1554   LEUKOCYTESUR NEGATIVE 09/21/2023 1554   LEUKOCYTESUR MODERATE 01/20/2014 1357   Sepsis Labs: @LABRCNTIP (procalcitonin:4,lacticidven:4) )No results found for this or any previous visit (from the past 240 hours).   Radiological Exams on Admission:   Assessment/Plan Principal Problem:   Myocardial injury Active Problems:   CAD (coronary artery disease)   Fall at home, initial encounter   Acute renal failure superimposed on stage 3b chronic kidney disease (HCC)   HLD (hyperlipidemia)   Type II diabetes mellitus with renal manifestations (HCC)   Leukocytosis   HTN (hypertension)   Depression   Assessment and Plan:  Myocardial injury and hx of CAD: trop  52, no CP, likely demand ischemia, in the setting of worsening CKD-3b, it is not significant now.  -Place in telemetry bed for observation - ASA 81 mg (324 mg in ED) -pt is allergic to statin, and is on Evolocumab injection' - trop trop - prn NTG.  Fall at home, initial encounter: CT head is negative for acute intracranial abnormalities.  Patient complains of bilateral upper thigh pain with muscle spasm. - Fall precaution - PT/OT - prn Tylenol , Percocet for pain - As needed Robaxin  - Follow-up x-ray of bilateral femur - F/u CK and TSH which were ordered by edp  Acute renal failure superimposed on stage 3b chronic kidney disease (HCC): - Avoid renal toxic medications - IV fluid: 1.5 L normal saline, then 75 cc/h - F/U CK level make sure patient does not have rhabdomyolysis - Follow-up UA due to urinary frequency  HLD (hyperlipidemia) -Patient is evolocumab at home  Type II diabetes  mellitus with renal manifestations [facility]): Recent A1c 6.6 well-controlled.  Patient states that she is taking 70/30 insulin  42 units twice a day and 2 unit in nighttime -SSI - 70/30 insulin  30 units twice daily  Leukocytosis: WBC  11.5, no fever.  Likely reactive - Follow-up repeat CBC  HTN (hypertension): Patient is not taking medications.  Blood pressure 151/79 -IV hydralazine  as needed  Depression - Lexapro     DVT ppx: SQ Heparin       Code Status: Full code    Family Communication:     not done, no family member is at bed side.      Disposition Plan:  Anticipate discharge back to previous environment  Consults called:  none  Admission status and Level of care: Telemetry:    for obs     Dispo: The patient is from: Home              Anticipated d/c is to: Home              Anticipated d/c date is: 1 day              Patient currently is not medically stable to d/c.    Severity of Illness:  The appropriate patient status for this patient is OBSERVATION. Observation status is judged to be reasonable and necessary in order to provide the required intensity of service to ensure the patient's safety. The patient's presenting symptoms, physical exam findings, and initial radiographic and laboratory data in the context of their medical condition is felt to place them at decreased risk for further clinical deterioration. Furthermore, it is anticipated that the patient will be medically stable for discharge from the hospital within 2 midnights of admission.        Date of Service 01/19/2024    Caleb Exon [facility]   If 7PM-7AM, please contact night-coverage [email or web address] 01/19/2024, 1:25 AM

## 2024-01-20 DIAGNOSIS — T796XXA Traumatic ischemia of muscle, initial encounter: Secondary | ICD-10-CM

## 2024-01-20 LAB — BASIC METABOLIC PANEL WITH GFR
Anion gap: 8 (ref 5–15)
BUN: 24 mg/dL — ABNORMAL HIGH (ref 8–23)
CO2: 21 mmol/L — ABNORMAL LOW (ref 22–32)
Calcium: 8.1 mg/dL — ABNORMAL LOW (ref 8.9–10.3)
Chloride: 111 mmol/L (ref 98–111)
Creatinine, Ser: 1.6 mg/dL — ABNORMAL HIGH (ref 0.44–1.00)
GFR, Estimated: 31 mL/min — ABNORMAL LOW (ref >60)
Glucose, Bld: 152 mg/dL — ABNORMAL HIGH (ref 70–99)
Potassium: 4.2 mmol/L (ref 3.5–5.1)
Sodium: 139 mmol/L (ref 135–145)

## 2024-01-20 LAB — CBC
HCT: 26.1 % — ABNORMAL LOW (ref 36.0–46.0)
Hemoglobin: 8.5 g/dL — ABNORMAL LOW (ref 12.0–15.0)
MCH: 30 pg (ref 26.0–34.0)
MCHC: 32.6 g/dL (ref 30.0–36.0)
MCV: 92.2 fL (ref 80.0–100.0)
Platelets: 305 K/uL (ref 150–400)
RBC: 2.83 MIL/uL — ABNORMAL LOW (ref 3.87–5.11)
RDW: 17.4 % — ABNORMAL HIGH (ref 11.5–15.5)
WBC: 8.6 K/uL (ref 4.0–10.5)
nRBC: 0 % (ref 0.0–0.2)

## 2024-01-20 LAB — CBG MONITORING, ED
Glucose-Capillary: 124 mg/dL — ABNORMAL HIGH (ref 70–99)
Glucose-Capillary: 160 mg/dL — ABNORMAL HIGH (ref 70–99)

## 2024-01-20 LAB — GLUCOSE, CAPILLARY
Glucose-Capillary: 188 mg/dL — ABNORMAL HIGH (ref 70–99)
Glucose-Capillary: 146 mg/dL — ABNORMAL HIGH (ref 70–99)

## 2024-01-20 LAB — CK: Total CK: 418 U/L — ABNORMAL HIGH (ref 38–234)

## 2024-01-20 LAB — MAGNESIUM: Magnesium: 2.1 mg/dL (ref 1.7–2.4)

## 2024-01-20 MED ORDER — ENSURE PLUS HIGH PROTEIN PO LIQD
237.0000 mL | Freq: Two times a day (BID) | ORAL | Status: DC
  Administered 2024-01-21 – 2024-01-25 (×10): 237 mL via ORAL

## 2024-01-20 NOTE — Care Management Obs Status (Signed)
 MEDICARE OBSERVATION STATUS NOTIFICATION   Patient Details  Name: Laura Hatfield MRN: [id number] Date of Birth: 26-Jun-1940   Medicare Observation Status Notification Given:  Chaney BRANDY CHRISTIANE LELON, CMA 01/20/2024, 1:57 PM

## 2024-01-20 NOTE — Inpatient Diabetes Management (Addendum)
 Inpatient Diabetes Program Recommendations  AACE/ADA: New Consensus Statement on Inpatient Glycemic Control   Target Ranges:  Prepandial:   less than 140 mg/dL      Peak postprandial:   less than 180 mg/dL (1-2 hours)      Critically ill patients:  140 - 180 mg/dL    Latest Reference Range & Units 01/20/24 04:33  Glucose 70 - 99 mg/dL 847 (H)    Latest Reference Range & Units 01/19/24 07:49 01/19/24 11:55 01/19/24 13:39 01/19/24 16:26 01/19/24 17:21 01/19/24 21:09  Glucose-Capillary 70 - 99 mg/dL 865 (H) 854 (H) 870 (H) 65 (L) 93 107 (H)   Review of Glycemic Control  Diabetes history: DM2 Outpatient Diabetes medications: 70/30 42 units QAM with breakfast, 2 units QPM with supper (patient frequently decreases 70/30 insulin  dose and/or skips dose depending on glucose) Current orders for Inpatient glycemic control: Novolog  0-9 units TID with meals, Novolog  0-5 units QHS  Inpatient Diabetes Program Recommendations:    Insulin : Patient received 70/30 30 units at 9:48 am on 11/25 and CBG down to 65 at 16:26 on 01/19/24.  Lab glucose 152 mg/dl this morning.  Outpatient DM: Patient reports that she has hypoglycemia at home at least 2-3 times per week.   At time of discharge, likely need to decrease 70/30 insulin  dosages.  NOTE: Spoke with patient at bedside in ED about diabetes and home regimen for diabetes control. Patient does not recall syncopal episode that occurred yesterday when I came to see her.  Patient confirms that she sees Dr. Cherilyn for DM control and that she is only using 70/30 insulin  outpatient. Patient reports that she uses 70/30 insulin  pens and she is prescribed 70/30 42 units in QAM and 2 units QPM.  However, she reports that she does not usually take the full dose of 70/30 in the morning and at night. Patient reports that depending on glucose, she will decide what dose of 70/30 to take or if she needs to skip it. Patient uses FreeStyle Libre CGM for glucose monitoring and  reports that she keeps glucose tablets and candy at bedside to use in cause of hypoglycemia.  Discussed that she received 70/30 30 units yesterday morning and glucose noted to be 65 mg/dl at 83:73 on 88/74/74.  Patient reports that when her glucose was 65 mg/dl on 88/74/74, she had symptoms of hypoglycemia and she was able to tell her glucose was low. Patient reports that she experiences hypoglycemia at least 2-3 times a week and she notes that Dr. Cherilyn is aware.   Discussed danger of hypoglycemia especially in elderly patients; discussed increased risk of fall and injuries when glucose is too low.  Asked patient to start keeping a detailed log of how much 70/30 she takes in the morning and at night; asked that she record exact dose so Dr. Cherilyn will be able to see what she is actually taking so he can make adjustments if needed. Stressed to patient that she may need to have insulin  dosages decreased and asked that she pay close attention to discharge instructions in case insulin  is decreased.   Patient verbalized understanding of information discussed and reports no further questions at this time related to diabetes.  Thanks, Earnie Gainer, RN, MSN, CDCES Diabetes Coordinator Inpatient Diabetes Program [phone] (Team Pager from 8am to 5pm)

## 2024-01-20 NOTE — Progress Notes (Signed)
 PROGRESS NOTE    Laura Hatfield  FMW:[id number] DOB: 04-10-1940 DOA: 01/18/2024 PCP: Treasa Pin, MD   Assessment & Plan:   Principal Problem:   Myocardial injury Active Problems:   CAD (coronary artery disease)   Fall at home, initial encounter   Acute renal failure superimposed on stage 3b chronic kidney disease (HCC)   HLD (hyperlipidemia)   Type II diabetes mellitus with renal manifestations (HCC)   Leukocytosis   HTN (hypertension)   Depression  Assessment and Plan: Fall: likely secondary to weakness. PT/OT consulted  UTI: UA is positive. Urine cx was not sent on day of admission. Urine cx ordered but not collected. Will start IV abxs after urine cx is sent    AKI on CKDIIIb: likely secondary rhabdomyolysis secondary to fall. Cr is trending down from day prior    Rhabdomyolysis: trending down. Likely secondary to fall at home. Will continue to monitor    HLD: home evolocumab at home   DM2: well controlled, HbA1c 6.6. Continue on SSI w/ accuchecks    Leukocytosis: infection vs reactive. WNL today   HTN: not taking any anti-HTN meds as per med rec  Depression: severity unknown. Continue on lexapro     Troponins elevated: likely secondary to demand ischemia. No CP  Hx of CAD: continue on aspirin , evolocumab.  ACD: likely secondary to CKD. No need for a transfusion currently      DVT prophylaxis: heparin   Code Status: full  Family Communication:  Disposition Plan: depends on PT/OT recs  Status is: Observation The patient remains OBS appropriate and will d/c before 2 midnights.    Level of care: Telemetry Consultants:    Procedures:  Antimicrobials:   Subjective: Pt c/o malaise  Objective: Vitals:   01/19/24 2330 01/20/24 0226 01/20/24 0544 01/20/24 0744  BP: 127/83 (!) 111/53 (!) 117/51   Pulse: 95 89 86   Resp: 18 13 20    Temp:  98.9 F (37.2 C)  98.5 F (36.9 C)  TempSrc:  Oral  Oral  SpO2: 100% 98% 100%    No intake or  output data in the 24 hours ending 01/20/24 1015 There were no vitals filed for this visit.  Examination:  General exam: Appears calm and comfortable  Respiratory system: Clear to auscultation. Respiratory effort normal. Cardiovascular system: S1 & S2+. No rubs, gallops or clicks. No pedal edema. Gastrointestinal system: Abdomen is nondistended, soft and nontender.  Normal bowel sounds heard. Central nervous system: Alert and awake. Moves all extremities  Psychiatry: Judgement and insight appears not at baseline. Flat mood and affect    Data Reviewed: I have personally reviewed following labs and imaging studies  CBC: Recent Labs  Lab 01/18/24 2307 01/19/24 0432  WBC 11.5* 8.6  HGB 10.6* 8.5*  HCT 32.9* 26.1*  MCV 90.9 92.2  PLT 375 305   Basic Metabolic Panel: Recent Labs  Lab 01/18/24 2307 01/19/24 0032 01/19/24 0226 01/20/24 0433  NA 137  --  137 139  K 5.0  --  4.6 4.2  CL 104  --  107 111  CO2 23  --  20* 21*  GLUCOSE 176*  --  144* 152*  BUN 23  --  25* 24*  CREATININE 2.07*  --  2.02* 1.60*  CALCIUM  9.3  --  8.7* 8.1*  MG  --  2.1  --  2.1   GFR: CrCl cannot be calculated (Unknown ideal weight.). Liver Function Tests: Recent Labs  Lab 01/18/24 2307  AST 54*  ALT  28  ALKPHOS 94  BILITOT 0.4  PROT 6.9  ALBUMIN 3.9   No results for input(s): LIPASE, AMYLASE in the last 168 hours. No results for input(s): AMMONIA in the last 168 hours. Coagulation Profile: No results for input(s): INR, PROTIME in the last 168 hours. Cardiac Enzymes: Recent Labs  Lab 01/19/24 0032 01/20/24 0433  CKTOTAL 719* 418*   BNP (last 3 results) No results for input(s): PROBNP in the last 8760 hours. HbA1C: No results for input(s): HGBA1C in the last 72 hours. CBG: Recent Labs  Lab 01/19/24 1339 01/19/24 1626 01/19/24 1721 01/19/24 2109 01/20/24 0742  GLUCAP 129* 65* 93 107* 124*   Lipid Profile: No results for input(s): CHOL, HDL,  LDLCALC, TRIG, CHOLHDL, LDLDIRECT in the last 72 hours. Thyroid Function Tests: Recent Labs    01/19/24 0032  TSH 2.010   Anemia Panel: No results for input(s): VITAMINB12, FOLATE, FERRITIN, TIBC, IRON, RETICCTPCT in the last 72 hours. Sepsis Labs: No results for input(s): PROCALCITON, LATICACIDVEN in the last 168 hours.  No results found for this or any previous visit (from the past 240 hours).       Radiology Studies: DG FEMUR, MIN 2 VIEWS RIGHT Result Date: 01/19/2024 EXAM: 2 VIEW(S) Xray of the Right Femur 01/19/2024 02:01:42 AM COMPARISON: None available. CLINICAL HISTORY: 221778 Upper leg pain 221778 FINDINGS: BONES AND JOINTS: No acute fracture. No focal osseous lesion. No joint dislocation. SOFT TISSUES: The soft tissues are unremarkable. IMPRESSION: 1. No significant abnormality. Electronically signed by: Franky Crease MD 01/19/2024 02:04 AM EST RP Workstation: [id number]   DG FEMUR MIN 2 VIEWS LEFT Result Date: 01/19/2024 EXAM: 2 VIEW(S) Xray of the Left Femur 01/19/2024 02:01:42 AM COMPARISON: None available. CLINICAL HISTORY: 221778 Upper leg pain 221778 FINDINGS: BONES AND JOINTS: No acute fracture. No focal osseous lesion. No joint dislocation. SOFT TISSUES: The soft tissues are unremarkable. IMPRESSION: 1. No significant abnormality. Electronically signed by: Franky Crease MD 01/19/2024 02:04 AM EST RP Workstation: [id number]   CT HEAD WO CONTRAST ( ) Result Date: 01/18/2024 EXAM: CT HEAD WITHOUT CONTRAST 01/18/2024 10:44:17 PM TECHNIQUE: CT of the head was performed without the administration of intravenous contrast. Automated exposure control, iterative reconstruction, and/or weight based adjustment of the mA/kV was utilized to reduce the radiation dose to as low as reasonably achievable. COMPARISON: 09/21/2023 CLINICAL HISTORY: Mental status change, unknown cause FINDINGS: BRAIN AND VENTRICLES: No acute hemorrhage. No evidence of acute infarct. No  hydrocephalus. No extra-axial collection. No mass effect or midline shift. There is atrophy and chronic small vessel disease throughout the deep white matter. Old right frontal infarct. Old left cerebellar infarct. ORBITS: No acute abnormality. SINUSES: No acute abnormality. SOFT TISSUES AND SKULL: No acute soft tissue abnormality. No skull fracture. IMPRESSION: 1. No acute intracranial abnormality. 2. Atrophy and chronic small vessel disease throughout the deep white matter. 3. 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 Electronically signed by: Franky Crease MD 01/18/2024 10:48 PM EST RP Workstation: [id number]        Scheduled Meds:  aspirin  EC  81 mg Oral Daily   escitalopram   5 mg Oral Daily   heparin   5,000 Units Subcutaneous Q8H   insulin  aspart  0-5 Units Subcutaneous QHS   insulin  aspart  0-9 Units Subcutaneous TID WC   Continuous Infusions:   LOS: 0 days       Anthony CHRISTELLA Pouch, MD [facility] Pager [phone]  If 7PM-7AM, please contact night-coverage 01/20/2024, 10:15 AM

## 2024-01-20 NOTE — Evaluation (Signed)
 Physical Therapy Evaluation Patient Details Name: Laura Hatfield MRN: [id number] DOB: 05/28/1940 Today's Date: 01/20/2024  History of Present Illness  Laura Hatfield is a 83 y.o. female with a past medical history of CAD, diabetes, hyperlipidemia, presents to the emergency department with generalized weakness/falls.  Clinical Impression  Pt is a very pleasant 83 y.o. female admitted d/t myocardial injury. Pt was received in bed and was alert and agreeable to PT evaluation. Pt HR remained between 85-90 throughout session. She was able to complete bed mobility with increased time/effort and vc to utilize bed rails. She reports she is able to use the RW at baseline but none available in ED. Pt performed STS with 2+ HHA and mod A to stand from lowest bed height. Upon standing, pt able to maintain balance with min A 2+ but demonstrates retropulsion. She was able to make side steps at bedside for 69ft total with notable weakness when leading with the L ft. She requires cuing to take steps backwards closer to the bed as she tends to drift forward with her side steps. Pt returned to bed with all need met. She will continue to benefit from skilled PT services to address her decline in functional mobility, strength, and balance.        If plan is discharge home, recommend the following: A little help with walking and/or transfers   Can travel by private vehicle   Yes    Equipment Recommendations None recommended by PT  Recommendations for Other Services       Functional Status Assessment Patient has had a recent decline in their functional status and demonstrates the ability to make significant improvements in function in a reasonable and predictable amount of time.     Precautions / Restrictions Precautions Precautions: Fall Recall of Precautions/Restrictions: Impaired Precaution/Restrictions Comments: does wear brace on LLE, pt reports this is baseline. Restrictions Weight Bearing  Restrictions Per Provider Order: No      Mobility  Bed Mobility Overal bed mobility: Needs Assistance Bed Mobility: Supine to Sit     Supine to sit: Supervision, HOB elevated, Used rails     General bed mobility comments: increased time; vc for bed rails    Transfers Overall transfer level: Needs assistance Equipment used: 2 person hand held assist Transfers: Sit to/from Stand       Squat pivot transfers: +2 physical assistance, Mod assist, Min assist     General transfer comment: 2+ HHA and mod A to stand from lowest bed height    Ambulation/Gait Ambulation/Gait assistance: Min assist, +2 physical assistance Gait Distance (Feet): 6 Feet Assistive device: 2 person hand held assist Gait Pattern/deviations: Step-to pattern, Narrow base of support       General Gait Details: side steps at bed side; decreased step length when amb to the left; min A with 2+ HHA; retropulsive when standing; drift forward from bedside and needs vc to step back closer to bed for side steps.  Stairs            Wheelchair Mobility     Tilt Bed    Modified Rankin (Stroke Patients Only)       Balance Overall balance assessment: Needs assistance Sitting-balance support: Feet supported, No upper extremity supported Sitting balance-Leahy Scale: Good Sitting balance - Comments: steady static sitting at EOB prior to transfer. Remains high falls risk.   Standing balance support: Bilateral upper extremity supported, During functional activity Standing balance-Leahy Scale: Fair Standing balance comment: 2+ HHA for standing activities;  no AD used; min A for balance once standing                             Pertinent Vitals/Pain Pain Assessment Pain Assessment: No/denies pain    Home Living Family/patient expects to be discharged to:: Private residence Living Arrangements: Other relatives (lives with cousin) Available Help at Discharge: Family;Available  PRN/intermittently Type of Home: House Home Access: Stairs to enter Entrance Stairs-Rails: Right;Left;Can reach both Entrance Stairs-Number of Steps: 7   Home Layout: One level Home Equipment: Agricultural Consultant (2 wheels);Toilet riser;Wheelchair - manual;BSC/3in1;Grab bars - toilet;Grab bars - tub/shower;Hand held shower head      Prior Function Prior Level of Function : Independent/Modified Independent;History of Falls (last six months)             Mobility Comments: Reports recently working with [facility] therapy. Uses WC for mobility, was also ambulatory with RW. ADLs Comments: generally MOD I with ADL; intermittent assist for IADL, orders groceries, has a cleaning service     Extremity/Trunk Assessment   Upper Extremity Assessment Upper Extremity Assessment: Overall WFL for tasks assessed    Lower Extremity Assessment Lower Extremity Assessment: Generalized weakness;LLE deficits/detail LLE Deficits / Details: L ankle fx in april 2024; wears brace but no pain    Cervical / Trunk Assessment Cervical / Trunk Assessment: Kyphotic  Communication   Communication Communication: No apparent difficulties    Cognition Arousal: Alert Behavior During Therapy: WFL for tasks assessed/performed   PT - Cognitive impairments: No apparent impairments                         Following commands: Intact       Cueing Cueing Techniques: Verbal cues     General Comments      Exercises Other Exercises Other Exercises: side steps at bedside-- 2 ft left, 2 ft right, 2 ft left   Assessment/Plan    PT Assessment Patient needs continued PT services  PT Problem List Decreased strength;Decreased activity tolerance;Decreased balance;Decreased mobility;Decreased coordination;Decreased safety awareness       PT Treatment Interventions Gait training;Stair training;Functional mobility training;Therapeutic activities;Therapeutic exercise;Balance training    PT Goals (Current goals  can be found in the Care Plan section)  Acute Rehab PT Goals Patient Stated Goal: to get stronger PT Goal Formulation: With patient Time For Goal Achievement: 02/03/24 Potential to Achieve Goals: Good    Frequency Min 1X/week     Co-evaluation               AM-PAC PT 6 Clicks Mobility  Outcome Measure Help needed turning from your back to your side while in a flat bed without using bedrails?: A Little Help needed moving from lying on your back to sitting on the side of a flat bed without using bedrails?: A Little Help needed moving to and from a bed to a chair (including a wheelchair)?: A Little Help needed standing up from a chair using your arms (e.g., wheelchair or bedside chair)?: A Little Help needed to walk in hospital room?: A Lot Help needed climbing 3-5 steps with a railing? : Total 6 Click Score: 15    End of Session Equipment Utilized During Treatment: Gait belt Activity Tolerance: Patient tolerated treatment well Patient left: in bed;with call bell/phone within reach;with bed alarm set Nurse Communication: Mobility status (request for warm food for pt) PT Visit Diagnosis: Unsteadiness on feet (R26.81);Other abnormalities of gait  and mobility (R26.89);Repeated falls (R29.6);Muscle weakness (generalized) (M62.81);History of falling (Z91.81);Difficulty in walking, not elsewhere classified (R26.2)    Time: 9083-9062 PT Time Calculation (min) (ACUTE ONLY): 21 min   Charges:   PT Evaluation $PT Eval Low Complexity: 1 Low PT Treatments $Therapeutic Activity: 8-22 mins PT General Charges $$ ACUTE PT VISIT: 1 Visit         Allena Bulls, SPT   Allena Bulls 01/20/2024, 11:03 AM

## 2024-01-21 DIAGNOSIS — N390 Urinary tract infection, site not specified: Secondary | ICD-10-CM

## 2024-01-21 LAB — CBC
HCT: 26.4 % — ABNORMAL LOW (ref 36.0–46.0)
Hemoglobin: 8.5 g/dL — ABNORMAL LOW (ref 12.0–15.0)
MCH: 29.4 pg (ref 26.0–34.0)
MCHC: 32.2 g/dL (ref 30.0–36.0)
MCV: 91.3 fL (ref 80.0–100.0)
Platelets: 285 K/uL (ref 150–400)
RBC: 2.89 MIL/uL — ABNORMAL LOW (ref 3.87–5.11)
RDW: 17.2 % — ABNORMAL HIGH (ref 11.5–15.5)
WBC: 7.2 K/uL (ref 4.0–10.5)
nRBC: 0 % (ref 0.0–0.2)

## 2024-01-21 LAB — BASIC METABOLIC PANEL WITH GFR
Anion gap: 6 (ref 5–15)
BUN: 22 mg/dL (ref 8–23)
CO2: 23 mmol/L (ref 22–32)
Calcium: 8.3 mg/dL — ABNORMAL LOW (ref 8.9–10.3)
Chloride: 111 mmol/L (ref 98–111)
Creatinine, Ser: 1.35 mg/dL — ABNORMAL HIGH (ref 0.44–1.00)
GFR, Estimated: 38 mL/min — ABNORMAL LOW (ref >60)
Glucose, Bld: 145 mg/dL — ABNORMAL HIGH (ref 70–99)
Potassium: 4.5 mmol/L (ref 3.5–5.1)
Sodium: 140 mmol/L (ref 135–145)

## 2024-01-21 LAB — GLUCOSE, CAPILLARY
Glucose-Capillary: 143 mg/dL — ABNORMAL HIGH (ref 70–99)
Glucose-Capillary: 142 mg/dL — ABNORMAL HIGH (ref 70–99)
Glucose-Capillary: 208 mg/dL — ABNORMAL HIGH (ref 70–99)
Glucose-Capillary: 141 mg/dL — ABNORMAL HIGH (ref 70–99)

## 2024-01-21 LAB — CK: Total CK: 242 U/L — ABNORMAL HIGH (ref 38–234)

## 2024-01-21 LAB — MAGNESIUM: Magnesium: 2.1 mg/dL (ref 1.7–2.4)

## 2024-01-21 MED ORDER — SODIUM CHLORIDE 0.9 % IV SOLN
1.0000 g | INTRAVENOUS | Status: DC
  Administered 2024-01-21 – 2024-01-25 (×5): 1 g via INTRAVENOUS
  Filled 2024-01-21 (×5): qty 10

## 2024-01-21 NOTE — TOC Progression Note (Addendum)
 Transition of Care [facility]) - Progression Note    Patient Details  Name: Laura Hatfield MRN: [id number] Date of Birth: 1940/12/05  Transition of [facility] CM/SW Contact  K'La JINNY Ruts, LCSW Phone Number: 01/21/2024, 11:44 AM  Clinical Narrative:    Chart reviewed. I was able to speak with the patient at bedside today. I introduced myself, my role, and reason for consult. I informed the patient of the SNF recommendation. The patient declined. I offered HH services and the patient was accepting. The patient would like [facility]. I will send patient information to the HUB.    3:50pm- Received a secure chat that the patient would like SNF placement. Went and spoke with the patient and she confirmed that SNF would be better. The patient reports that she will take any bed offer that she receives. Please complete FL2 for this patient and give patient options.   Expected Discharge Plan: Home w Home Health Services Barriers to Discharge: Continued Medical Work up               Expected Discharge Plan and Services   Discharge Planning Services: CM Consult Post Acute Care Choice: Home Health, Skilled Nursing Facility Living arrangements for the past 2 months: Single Family Home                                       Social Drivers of Health (SDOH) Interventions SDOH Screenings   Food Insecurity: No Food Insecurity (01/20/2024)  Housing: Low Risk  (01/20/2024)  Transportation Needs: No Transportation Needs (01/20/2024)  Utilities: Not At Risk (01/20/2024)  Alcohol Screen: Low Risk  (03/29/2018)  Financial Resource Strain: Low Risk  (11/20/2023)   Received from [facility]  Social Connections: Socially Isolated (01/20/2024)  Tobacco Use: Low Risk  (01/01/2024)   Received from [facility]    Readmission Risk Interventions     No data to display

## 2024-01-21 NOTE — Plan of Care (Signed)

## 2024-01-21 NOTE — Progress Notes (Signed)
 PROGRESS NOTE    Laura Hatfield  FMW:[id number] DOB: April 02, 1940 DOA: 01/18/2024 PCP: Treasa Pin, MD   Assessment & Plan:   Principal Problem:   Myocardial injury Active Problems:   CAD (coronary artery disease)   Fall at home, initial encounter   Acute renal failure superimposed on stage 3b chronic kidney disease (HCC)   HLD (hyperlipidemia)   Type II diabetes mellitus with renal manifestations (HCC)   Leukocytosis   HTN (hypertension)   Depression  Assessment and Plan: Fall: likely secondary to weakness. PT/OT recs SNF  UTI: UA is positive. Urine cx was not sent on day of admission. Urine cx is pending. Continue on IV rocephin    AKI on CKDIIIb: likely secondary rhabdomyolysis secondary to fall. Cr is trending down again today    Rhabdomyolysis: trending down again today. Likely secondary to fall at home. Will continue to monitor    HLD: home evolocumab at home   DM2: well controlled, HbA1c 6.6. Continue on SSI w/ accuchecks   Leukocytosis: resolved  HTN: not taking any anti-HTN meds as per med rec  Depression: severity unknown. Continue on lexapro     Troponins elevated: likely secondary to demand ischemia. No CP  Hx of CAD: continue on aspirin , evolocumab   ACD: likely secondary to CKD. H&H are stable     DVT prophylaxis: heparin   Code Status: full  Family Communication: discussed pt's care w/ pt's cousin, Curtistine, and answered his questions Disposition Plan: likely d/c to SNF  Status is: Observation The patient remains OBS appropriate and will d/c before 2 midnights. Waiting on urine cx results & SNF placement     Level of care: Telemetry Consultants:    Procedures:  Antimicrobials:   Subjective: Pt c/o fatigue   Objective: Vitals:   01/20/24 1440 01/20/24 2244 01/21/24 0511 01/21/24 0728  BP: 130/62 133/62 109/77 117/61  Pulse: 83 90 85 91  Resp: 17 19 16 16   Temp: 98.2 F (36.8 C) 98.9 F (37.2 C) 98.4 F (36.9 C) 98.7 F  (37.1 C)  TempSrc:      SpO2: 97% 97% 97% 97%    Intake/Output Summary (Last 24 hours) at 01/21/2024 0919 Last data filed at 01/20/2024 1144 Gross per 24 hour  Intake 1686.07 ml  Output --  Net 1686.07 ml   There were no vitals filed for this visit.  Examination:  General exam: Appears comfortable  Respiratory system: clear breath sounds b/l  Cardiovascular system: S1/S2+. No rubs or clicks  Gastrointestinal system: abd is soft, NT, ND, hypoactive bowel sounds Central nervous system:  alert & awake. Moves all extremities   Psychiatry: Judgement and insight appears improved. Flat mood and affect    Data Reviewed: I have personally reviewed following labs and imaging studies  CBC: Recent Labs  Lab 01/18/24 2307 01/19/24 0432 01/21/24 0445  WBC 11.5* 8.6 7.2  HGB 10.6* 8.5* 8.5*  HCT 32.9* 26.1* 26.4*  MCV 90.9 92.2 91.3  PLT 375 305 285   Basic Metabolic Panel: Recent Labs  Lab 01/18/24 2307 01/19/24 0032 01/19/24 0226 01/20/24 0433 01/21/24 0445  NA 137  --  137 139 140  K 5.0  --  4.6 4.2 4.5  CL 104  --  107 111 111  CO2 23  --  20* 21* 23  GLUCOSE 176*  --  144* 152* 145*  BUN 23  --  25* 24* 22  CREATININE 2.07*  --  2.02* 1.60* 1.35*  CALCIUM  9.3  --  8.7* 8.1*  8.3*  MG  --  2.1  --  2.1 2.1   GFR: CrCl cannot be calculated (Unknown ideal weight.). Liver Function Tests: Recent Labs  Lab 01/18/24 2307  AST 54*  ALT 28  ALKPHOS 94  BILITOT 0.4  PROT 6.9  ALBUMIN 3.9   No results for input(s): LIPASE, AMYLASE in the last 168 hours. No results for input(s): AMMONIA in the last 168 hours. Coagulation Profile: No results for input(s): INR, PROTIME in the last 168 hours. Cardiac Enzymes: Recent Labs  Lab 01/19/24 0032 01/20/24 0433 01/21/24 0445  CKTOTAL 719* 418* 242*   BNP (last 3 results) No results for input(s): PROBNP in the last 8760 hours. HbA1C: No results for input(s): HGBA1C in the last 72 hours. CBG: Recent  Labs  Lab 01/20/24 0742 01/20/24 1141 01/20/24 1634 01/20/24 2241 01/21/24 0730  GLUCAP 124* 160* 188* 146* 143*   Lipid Profile: No results for input(s): CHOL, HDL, LDLCALC, TRIG, CHOLHDL, LDLDIRECT in the last 72 hours. Thyroid Function Tests: Recent Labs    01/19/24 0032  TSH 2.010   Anemia Panel: No results for input(s): VITAMINB12, FOLATE, FERRITIN, TIBC, IRON, RETICCTPCT in the last 72 hours. Sepsis Labs: No results for input(s): PROCALCITON, LATICACIDVEN in the last 168 hours.  No results found for this or any previous visit (from the past 240 hours).       Radiology Studies: No results found.       Scheduled Meds:  aspirin  EC  81 mg Oral Daily   escitalopram   5 mg Oral Daily   feeding supplement  237 mL Oral BID BM   heparin   5,000 Units Subcutaneous Q8H   insulin  aspart  0-5 Units Subcutaneous QHS   insulin  aspart  0-9 Units Subcutaneous TID WC   Continuous Infusions:  cefTRIAXone  (ROCEPHIN )  IV       LOS: 0 days       Anthony CHRISTELLA Pouch, MD [facility] Pager [phone]  If 7PM-7AM, please contact night-coverage 01/21/2024, 9:19 AM

## 2024-01-22 DIAGNOSIS — N390 Urinary tract infection, site not specified: Secondary | ICD-10-CM | POA: Diagnosis not present

## 2024-01-22 LAB — MAGNESIUM: Magnesium: 2.1 mg/dL (ref 1.7–2.4)

## 2024-01-22 LAB — BASIC METABOLIC PANEL WITH GFR
Anion gap: 8 (ref 5–15)
BUN: 23 mg/dL (ref 8–23)
CO2: 22 mmol/L (ref 22–32)
Calcium: 8.6 mg/dL — ABNORMAL LOW (ref 8.9–10.3)
Chloride: 107 mmol/L (ref 98–111)
Creatinine, Ser: 1.31 mg/dL — ABNORMAL HIGH (ref 0.44–1.00)
GFR, Estimated: 40 mL/min — ABNORMAL LOW (ref >60)
Glucose, Bld: 153 mg/dL — ABNORMAL HIGH (ref 70–99)
Potassium: 4.5 mmol/L (ref 3.5–5.1)
Sodium: 137 mmol/L (ref 135–145)

## 2024-01-22 LAB — CBC
HCT: 26.7 % — ABNORMAL LOW (ref 36.0–46.0)
Hemoglobin: 8.7 g/dL — ABNORMAL LOW (ref 12.0–15.0)
MCH: 29.7 pg (ref 26.0–34.0)
MCHC: 32.6 g/dL (ref 30.0–36.0)
MCV: 91.1 fL (ref 80.0–100.0)
Platelets: 314 K/uL (ref 150–400)
RBC: 2.93 MIL/uL — ABNORMAL LOW (ref 3.87–5.11)
RDW: 17 % — ABNORMAL HIGH (ref 11.5–15.5)
WBC: 8.3 K/uL (ref 4.0–10.5)
nRBC: 0 % (ref 0.0–0.2)

## 2024-01-22 LAB — GLUCOSE, CAPILLARY
Glucose-Capillary: 143 mg/dL — ABNORMAL HIGH (ref 70–99)
Glucose-Capillary: 136 mg/dL — ABNORMAL HIGH (ref 70–99)
Glucose-Capillary: 249 mg/dL — ABNORMAL HIGH (ref 70–99)
Glucose-Capillary: 181 mg/dL — ABNORMAL HIGH (ref 70–99)

## 2024-01-22 LAB — URINE CULTURE

## 2024-01-22 LAB — CK: Total CK: 138 U/L (ref 38–234)

## 2024-01-22 MED ORDER — MAGNESIUM HYDROXIDE 400 MG/5ML PO SUSP
30.0000 mL | Freq: Every day | ORAL | Status: DC | PRN
  Administered 2024-01-22: 30 mL via ORAL
  Filled 2024-01-22: qty 30

## 2024-01-22 MED ORDER — FLEET ENEMA RE ENEM
1.0000 | ENEMA | Freq: Every day | RECTAL | Status: DC | PRN
  Administered 2024-01-22: 1 via RECTAL

## 2024-01-22 MED ORDER — LACTULOSE 10 GM/15ML PO SOLN
30.0000 g | Freq: Two times a day (BID) | ORAL | Status: DC
  Administered 2024-01-22 – 2024-01-24 (×4): 30 g via ORAL
  Filled 2024-01-22 (×6): qty 60

## 2024-01-22 NOTE — Plan of Care (Signed)

## 2024-01-22 NOTE — NC FL2 (Signed)
 [place]  MEDICAID FL2 LEVEL OF CARE FORM     IDENTIFICATION  Patient Name: Laura Hatfield Birthdate: [birth year] Sex: female Admission Date (Current Location): 01/18/2024  [facility] Residence and [place] Number:  Chiropodist and Address:  [facility], [address], [place], [place] [address]      Provider Number: [id number]  Attending Physician Name and Address:  Trudy Anthony HERO, MD  Relative Name and Phone Number:  Curtistine [phone]    Current Level of Care: SNF Recommended Level of Care: Skilled Nursing Facility Prior Approval Number:    Date Approved/Denied:   PASRR Number: [id number]  Discharge Plan: SNF    Current Diagnoses: Patient Active Problem List   Diagnosis Date Noted   Myocardial injury 01/19/2024   Type II diabetes mellitus with renal manifestations (HCC) 01/19/2024   Fall at home, initial encounter 01/19/2024   Leukocytosis 01/19/2024   Acute renal failure superimposed on stage 3b chronic kidney disease (HCC) 01/19/2024   HTN (hypertension) 01/19/2024   Depression 01/19/2024   Sepsis secondary to UTI (HCC) 06/07/2023   Closed left ankle fracture 06/07/2023   Hypertensive urgency 07/26/2017   Aphasia 05/01/2017   HLD (hyperlipidemia) 05/01/2017   Diabetes (HCC) 05/01/2017   CAD (coronary artery disease) 05/01/2017   Stage 3b chronic kidney disease (HCC) 05/01/2017   TIA (transient ischemic attack) 03/21/2016    Orientation RESPIRATION BLADDER Height & Weight     Self, Time, Situation, Place  Normal Incontinent Weight:   Height:     BEHAVIORAL SYMPTOMS/MOOD NEUROLOGICAL BOWEL NUTRITION STATUS      Continent Diet (Diet Carb Modified Fluid consistency: Thin: Carb Control starting at 11/28 0736  Fall precautions starting at 11/25 0023)  AMBULATORY STATUS COMMUNICATION OF NEEDS Skin   Limited Assist   Normal                       Personal Care Assistance Level of Assistance  Dressing, Bathing,  Feeding Bathing Assistance: Maximum assistance Feeding assistance: Maximum assistance Dressing Assistance: Maximum assistance     Functional Limitations Info  Sight, Hearing, Speech Sight Info: Adequate Hearing Info: Adequate Speech Info: Adequate    SPECIAL CARE FACTORS FREQUENCY  PT (By licensed PT), OT (By licensed OT)     PT Frequency: 5x OT Frequency: 5x            Contractures Contractures Info: Not present    Additional Factors Info                  Current Medications (01/22/2024):  This is the current hospital active medication list Current Facility-Administered Medications  Medication Dose Route Frequency Provider Last Rate Last Admin   acetaminophen  (TYLENOL ) tablet 650 mg  650 mg Oral Q6H PRN Niu, Xilin, MD   650 mg at 01/21/24 0756   aspirin  EC tablet 81 mg  81 mg Oral Daily Niu, Xilin, MD   81 mg at 01/22/24 0840   cefTRIAXone  (ROCEPHIN ) 1 g in sodium chloride  0.9 % 100 mL IVPB  1 g Intravenous Q24H Trudy Anthony HERO, MD 200 mL/hr at 01/22/24 0845 1 g at 01/22/24 0845   escitalopram  (LEXAPRO ) tablet 5 mg  5 mg Oral Daily Niu, Xilin, MD   5 mg at 01/22/24 0839   feeding supplement (ENSURE PLUS HIGH PROTEIN) liquid 237 mL  237 mL Oral BID BM Trudy Anthony HERO, MD   237 mL at 01/21/24 1432   heparin  injection 5,000 Units  5,000 Units  Subcutaneous Q8H Niu, Xilin, MD   5,000 Units at 01/22/24 0532   hydrALAZINE  (APRESOLINE ) injection 5 mg  5 mg Intravenous Q2H PRN Niu, Xilin, MD       insulin  aspart (novoLOG ) injection 0-5 Units  0-5 Units Subcutaneous QHS Niu, Xilin, MD       insulin  aspart (novoLOG ) injection 0-9 Units  0-9 Units Subcutaneous TID WC Niu, Xilin, MD   1 Units at 01/22/24 9160   magnesium hydroxide (MILK OF MAGNESIA) suspension 30 mL  30 mL Oral Daily PRN Mansy, Jan A, MD   30 mL at 01/22/24 0222   methocarbamol  (ROBAXIN ) tablet 500 mg  500 mg Oral Q8H PRN Niu, Xilin, MD       nitroGLYCERIN  (NITROSTAT ) SL tablet 0.4 mg  0.4 mg Sublingual Q5  min PRN Niu, Xilin, MD       ondansetron  (ZOFRAN ) injection 4 mg  4 mg Intravenous Q8H PRN Niu, Xilin, MD       sodium phosphate (FLEET) enema 1 enema  1 enema Rectal Daily PRN Mansy, Madison LABOR, MD         Discharge Medications: Please see discharge summary for a list of discharge medications.  Relevant Imaging Results:  Relevant Lab Results:   Additional Information    Alfonso Rummer, LCSW

## 2024-01-22 NOTE — Progress Notes (Signed)
 PROGRESS NOTE    Laura Hatfield  FMW:[id number] DOB: 08/25/1940 DOA: 01/18/2024 PCP: Treasa Pin, MD   Assessment & Plan:   Principal Problem:   Myocardial injury Active Problems:   CAD (coronary artery disease)   Fall at home, initial encounter   Acute renal failure superimposed on stage 3b chronic kidney disease (HCC)   HLD (hyperlipidemia)   Type II diabetes mellitus with renal manifestations (HCC)   Leukocytosis   HTN (hypertension)   Depression  Assessment and Plan: Fall: likely secondary to weakness. PT/OT recs SNF  UTI: UA is positive. Urine cx was not sent on day of admission. Urine cx shows containment. Continue on IV rocephin     AKI on CKDIIIb: likely secondary rhabdomyolysis secondary to fall. Cr is trending down again today     Rhabdomyolysis: CK is WNL today. Likely secondary to fall at home. Will continue to monitor    HLD: home evolocumab at home   DM2: well controlled, HbA1c 6.6. Continue on SSI w/ accuchecks    Leukocytosis: resolved  HTN: not taking any anti-HTN meds as per med rec  Depression: severity unknown. Continue on lexapro    Troponins elevated: likely secondary to demand ischemia. No CP  Hx of CAD: continue on aspirin , evolocumab  ACD: likely secondary to CKD. H&H are stable   Constipation: pt requested enema    DVT prophylaxis: heparin   Code Status: full  Family Communication:  Disposition Plan: likely d/c to SNF  Status is: Observation The patient remains OBS appropriate and will d/c before 2 midnights.  Needs SNF placement, CM is working on this     Level of care: Telemetry Consultants:    Procedures:  Antimicrobials:   Subjective: Pt c/o malaise   Objective: Vitals:   01/21/24 1938 01/22/24 0007 01/22/24 0342 01/22/24 0730  BP: 135/65 123/66 (!) 142/57 132/63  Pulse: 75 83 82 78  Resp: 17 16 16 20   Temp: 98.5 F (36.9 C) 98.9 F (37.2 C) 99 F (37.2 C) 99 F (37.2 C)  TempSrc: Oral Oral Oral Oral   SpO2: 99% 98% 98% 99%    Intake/Output Summary (Last 24 hours) at 01/22/2024 0910 Last data filed at 01/21/2024 2000 Gross per 24 hour  Intake 240 ml  Output 677 ml  Net -437 ml   There were no vitals filed for this visit.  Examination:  General exam: appears calm & comfortable  Respiratory system: clear breath sounds b/l  Cardiovascular system: S1 & S2+. No rubs or clicks  Gastrointestinal system: abd is soft, NT, ND, hypoactive bowel sounds  Central nervous system:  alert & awake. Moves all extremities Psychiatry: Judgement and insight appears improved. Flat mood and affect    Data Reviewed: I have personally reviewed following labs and imaging studies  CBC: Recent Labs  Lab 01/18/24 2307 01/19/24 0432 01/21/24 0445 01/22/24 0420  WBC 11.5* 8.6 7.2 8.3  HGB 10.6* 8.5* 8.5* 8.7*  HCT 32.9* 26.1* 26.4* 26.7*  MCV 90.9 92.2 91.3 91.1  PLT 375 305 285 314   Basic Metabolic Panel: Recent Labs  Lab 01/18/24 2307 01/19/24 0032 01/19/24 0226 01/20/24 0433 01/21/24 0445 01/22/24 0420  NA 137  --  137 139 140 137  K 5.0  --  4.6 4.2 4.5 4.5  CL 104  --  107 111 111 107  CO2 23  --  20* 21* 23 22  GLUCOSE 176*  --  144* 152* 145* 153*  BUN 23  --  25* 24* 22 23  CREATININE 2.07*  --  2.02* 1.60* 1.35* 1.31*  CALCIUM  9.3  --  8.7* 8.1* 8.3* 8.6*  MG  --  2.1  --  2.1 2.1 2.1   GFR: CrCl cannot be calculated (Unknown ideal weight.). Liver Function Tests: Recent Labs  Lab 01/18/24 2307  AST 54*  ALT 28  ALKPHOS 94  BILITOT 0.4  PROT 6.9  ALBUMIN 3.9   No results for input(s): LIPASE, AMYLASE in the last 168 hours. No results for input(s): AMMONIA in the last 168 hours. Coagulation Profile: No results for input(s): INR, PROTIME in the last 168 hours. Cardiac Enzymes: Recent Labs  Lab 01/19/24 0032 01/20/24 0433 01/21/24 0445 01/22/24 0420  CKTOTAL 719* 418* 242* 138   BNP (last 3 results) No results for input(s): PROBNP in the last  8760 hours. HbA1C: No results for input(s): HGBA1C in the last 72 hours. CBG: Recent Labs  Lab 01/21/24 0730 01/21/24 1149 01/21/24 1707 01/21/24 2147 01/22/24 0804  GLUCAP 143* 142* 208* 141* 143*   Lipid Profile: No results for input(s): CHOL, HDL, LDLCALC, TRIG, CHOLHDL, LDLDIRECT in the last 72 hours. Thyroid Function Tests: No results for input(s): TSH, T4TOTAL, FREET4, T3FREE, THYROIDAB in the last 72 hours.  Anemia Panel: No results for input(s): VITAMINB12, FOLATE, FERRITIN, TIBC, IRON, RETICCTPCT in the last 72 hours. Sepsis Labs: No results for input(s): PROCALCITON, LATICACIDVEN in the last 168 hours.  No results found for this or any previous visit (from the past 240 hours).       Radiology Studies: No results found.       Scheduled Meds:  aspirin  EC  81 mg Oral Daily   escitalopram   5 mg Oral Daily   feeding supplement  237 mL Oral BID BM   heparin   5,000 Units Subcutaneous Q8H   insulin  aspart  0-5 Units Subcutaneous QHS   insulin  aspart  0-9 Units Subcutaneous TID WC   Continuous Infusions:  cefTRIAXone  (ROCEPHIN )  IV 1 g (01/22/24 0845)     LOS: 0 days       Anthony CHRISTELLA Pouch, MD [facility] Pager [phone]  If 7PM-7AM, please contact night-coverage 01/22/2024, 9:10 AM

## 2024-01-22 NOTE — Progress Notes (Signed)
 Occupational Therapy Treatment Patient Details Name: Laura Hatfield MRN: [id number] DOB: May 21, 1940 Today's Date: 01/22/2024   History of present illness Laura Hatfield is a 83 y.o. female with a past medical history of CAD, diabetes, hyperlipidemia, presents to the emergency department with generalized weakness/falls. MD assessment includes MI, UTI, rhabdomyolysis.    OT comments  Pt received in room with NT, performing squat pivot to [facility]. OT provides MOD A for safe completion of transfer with cues for hand placement. Pt required MOD A for initial STS using RW, MIN A for pericare and trunk assist during dynamic balance challenges, and takes a few steps backwards to sit in recliner with MIN A. Pt quickly verbalizes need for BM and transfers back to [facility] with MIN - MOD A for step pivot, cues for RW proximity and LLE placement as pt tends to place L foot outside of RW base. RN notified of pt's request for enema. Pt left seated in recliner, alarm set and needs in reach. VSS on RA throughout. OT will continue to follow, discharge recommendation appropriate. Patient will benefit from continued inpatient follow up therapy, <3 hours/day       If plan is discharge home, recommend the following:  Two people to help with walking and/or transfers;A lot of help with bathing/dressing/bathroom;Supervision due to cognitive status;Help with stairs or ramp for entrance;Assist for transportation;Assistance with cooking/housework   Equipment Recommendations  None recommended by OT       Precautions / Restrictions Precautions Precautions: Fall Recall of Precautions/Restrictions: Impaired Precaution/Restrictions Comments: does wear brace on LLE, pt reports this is baseline. Restrictions Weight Bearing Restrictions Per Provider Order: No       Mobility Bed Mobility               General bed mobility comments: NT    Transfers Overall transfer level: Needs assistance Equipment used:  Rolling walker (2 wheels), None Transfers: Sit to/from Stand, Bed to chair/wheelchair/BSC Sit to Stand: Mod assist, Min assist   Squat pivot transfers: Mod assist Step pivot transfers: Mod assist, Min assist     General transfer comment: multiple STS with RW, step pivot recliner <>BSC, requires cues for hand placement     Balance Overall balance assessment: Needs assistance Sitting-balance support: Feet supported, No upper extremity supported Sitting balance-Leahy Scale: Fair     Standing balance support: Bilateral upper extremity supported, During functional activity Standing balance-Leahy Scale: Fair Standing balance comment: minA for standing balace                           ADL either performed or assessed with clinical judgement   ADL Overall ADL's : Needs assistance/impaired                         Toilet Transfer: BSC/3in1;Rolling walker (2 wheels);Moderate assistance;Squat-pivot;Stand-pivot;Cueing for sequencing;Cueing for safety;Minimal assistance Toilet Transfer Details (indicate cue type and reason): pt recieved in room with NT performing squat pivot without AD to [facility] OT enters room to assist. pt completes STS with MOD A from [facility], is able to take a few steps backwards to sit in recliner. cues for AD use and step pattern, decreased ability to clear L foot with ankle brace donned, tends to get LLE outide of RW base, cues to correct. pt sits in recliner for a moment, verbalizes need to have BM again, and step pivot back to BSC, MIN - MOD A. Pt sits on  BSC for 10+ minutes for unsuccessful BM, step pivot back to recliner using RW with MIN A Toileting- Clothing Manipulation and Hygiene: Minimal assistance;Sit to/from stand Toileting - Clothing Manipulation Details (indicate cue type and reason): minA for upright posture, requires trunk support for dynamic standing       General ADL Comments: cues to avoid bearing down while trying to have a BM, increased  time during session to allow for BM on [facility] with pt requesting enema (RN notified). would recommend +2 for walking due to LLE instability and poor ability to self-correct, pt high falls risk     Communication Communication Communication: No apparent difficulties   Cognition Arousal: Alert Behavior During Therapy: WFL for tasks assessed/performed Cognition: No family/caregiver present to determine baseline, No apparent impairments                               Following commands: Intact        Cueing   Cueing Techniques: Verbal cues        General Comments VSS on RA    Pertinent Vitals/ Pain       Pain Assessment Pain Assessment: Faces Faces Pain Scale: Hurts a little bit Pain Location: BLE Pain Descriptors / Indicators: Aching, Burning, Discomfort Pain Intervention(s): Monitored during session, Limited activity within patient's tolerance, Repositioned   Frequency  Min 1X/week        Progress Toward Goals  OT Goals(current goals can now be found in the care plan section)  Progress towards OT goals: Progressing toward goals  Acute Rehab OT Goals OT Goal Formulation: With patient Time For Goal Achievement: 02/02/24 Potential to Achieve Goals: Good ADL Goals Pt Will Perform Grooming: sitting;with modified independence Pt Will Perform Lower Body Dressing: sit to/from stand;with supervision;with set-up Pt Will Transfer to Toilet: bedside commode;with set-up;with supervision Pt Will Perform Toileting - Clothing Manipulation and hygiene: sit to/from stand;with supervision;with set-up;with adaptive equipment  Plan         AM-PAC OT 6 Clicks Daily Activity     Outcome Measure   Help from another person eating meals?: A Little Help from another person taking care of personal grooming?: A Little Help from another person toileting, which includes using toliet, bedpan, or urinal?: A Lot Help from another person bathing (including washing, rinsing,  drying)?: A Lot Help from another person to put on and taking off regular upper body clothing?: A Little Help from another person to put on and taking off regular lower body clothing?: A Lot 6 Click Score: 15    End of Session Equipment Utilized During Treatment: Rolling walker (2 wheels);Gait belt  OT Visit Diagnosis: Repeated falls (R29.6);Muscle weakness (generalized) (M62.81)   Activity Tolerance Patient tolerated treatment well   Patient Left with call bell/phone within reach;in chair;with chair alarm set   Nurse Communication Mobility status        Time: 8789-8758 OT Time Calculation (min): 31 min  Charges: OT General Charges $OT Visit: 1 Visit OT Treatments $Self Care/Home Management : 23-37 mins  Hazely Sealey L. Adon Gehlhausen, OTR/L  01/22/24, 1:33 PM

## 2024-01-23 ENCOUNTER — Encounter: Payer: Self-pay | Admitting: Internal Medicine

## 2024-01-23 DIAGNOSIS — N390 Urinary tract infection, site not specified: Secondary | ICD-10-CM | POA: Diagnosis not present

## 2024-01-23 LAB — BASIC METABOLIC PANEL WITH GFR
Anion gap: 7 (ref 5–15)
BUN: 25 mg/dL — ABNORMAL HIGH (ref 8–23)
CO2: 24 mmol/L (ref 22–32)
Calcium: 8.7 mg/dL — ABNORMAL LOW (ref 8.9–10.3)
Chloride: 109 mmol/L (ref 98–111)
Creatinine, Ser: 1.32 mg/dL — ABNORMAL HIGH (ref 0.44–1.00)
GFR, Estimated: 39 mL/min — ABNORMAL LOW (ref >60)
Glucose, Bld: 165 mg/dL — ABNORMAL HIGH (ref 70–99)
Potassium: 4.6 mmol/L (ref 3.5–5.1)
Sodium: 140 mmol/L (ref 135–145)

## 2024-01-23 LAB — GLUCOSE, CAPILLARY
Glucose-Capillary: 155 mg/dL — ABNORMAL HIGH (ref 70–99)
Glucose-Capillary: 157 mg/dL — ABNORMAL HIGH (ref 70–99)
Glucose-Capillary: 170 mg/dL — ABNORMAL HIGH (ref 70–99)
Glucose-Capillary: 178 mg/dL — ABNORMAL HIGH (ref 70–99)
Glucose-Capillary: 186 mg/dL — ABNORMAL HIGH (ref 70–99)

## 2024-01-23 LAB — CBC
HCT: 27.7 % — ABNORMAL LOW (ref 36.0–46.0)
Hemoglobin: 9 g/dL — ABNORMAL LOW (ref 12.0–15.0)
MCH: 30 pg (ref 26.0–34.0)
MCHC: 32.5 g/dL (ref 30.0–36.0)
MCV: 92.3 fL (ref 80.0–100.0)
Platelets: 313 K/uL (ref 150–400)
RBC: 3 MIL/uL — ABNORMAL LOW (ref 3.87–5.11)
RDW: 17.2 % — ABNORMAL HIGH (ref 11.5–15.5)
WBC: 7.8 K/uL (ref 4.0–10.5)
nRBC: 0 % (ref 0.0–0.2)

## 2024-01-23 LAB — MAGNESIUM: Magnesium: 2.1 mg/dL (ref 1.7–2.4)

## 2024-01-23 NOTE — Progress Notes (Signed)
 Patient received to unit via w/c in stable condition from 2A. Patient able to ambulate with assistance from w/c to bed. Patient oriented to room and call light system. Skin assessment completed. No alterations noted.

## 2024-01-23 NOTE — Progress Notes (Signed)
 PROGRESS NOTE    Laura Hatfield  FMW:[id number] DOB: [birth year] DOA: 01/18/2024 PCP: Treasa Pin, MD   Assessment & Plan:   Principal Problem:   Myocardial injury Active Problems:   CAD (coronary artery disease)   Fall at home, initial encounter   Acute renal failure superimposed on stage 3b chronic kidney disease (HCC)   HLD (hyperlipidemia)   Type II diabetes mellitus with renal manifestations (HCC)   Leukocytosis   HTN (hypertension)   Depression  Assessment and Plan: Fall: likely secondary to weakness. PT/OT recs SNF  UTI: UA is positive. Urine cx was not sent on day of admission. Urine cx shows containment. Continue on IV rocephin  x 5 days to complete the course.    AKI on CKDIIIb: likely secondary rhabdomyolysis secondary to fall. Cr is labile    Rhabdomyolysis: likely secondary to fall at home. Resolved    HLD: home evolocumab at home   DM2: well controlled, HbA1c 6.6. Continue on SSI w/ accuchceks    Leukocytosis: resolved  HTN: not taking any anti-HTN meds as per med rec  Depression: severity unknown. Continue on lexapro     Troponins elevated: likely secondary to demand ischemia. No CP  Hx of CAD: continue on aspirin , evolocumab   ACD: likely secondary to CKD. Will transfuse if Hb < 7.0   Constipation: had BM today    DVT prophylaxis: heparin   Code Status: full  Family Communication:  Disposition Plan: likely d/c to SNF  Status is: Observation The patient remains OBS appropriate and will d/c before 2 midnights.  Needs SNF placement, still waiting on bed offers as per CM     Level of care: Telemetry Consultants:    Procedures:  Antimicrobials:   Subjective: Pt c/o fatigue   Objective: Vitals:   01/22/24 2208 01/23/24 0158 01/23/24 0527 01/23/24 0819  BP: (!) 139/59 (!) 141/72 (!) 125/53 117/61  Pulse: 77 81 76 72  Resp: 18 16 17 16   Temp: 98.4 F (36.9 C) 98.5 F (36.9 C) 98.5 F (36.9 C) 98.4 F (36.9 C)  TempSrc: Oral  Oral Oral Oral  SpO2: 100% 98% 100% 98%  Weight:   89.2 kg     Intake/Output Summary (Last 24 hours) at 01/23/2024 0955 Last data filed at 01/23/2024 0400 Gross per 24 hour  Intake 442.24 ml  Output 700 ml  Net -257.76 ml   Filed Weights   01/23/24 0527  Weight: 89.2 kg    Examination:  General exam:  appears comfortable Respiratory system: clear breath sounds b/l  Cardiovascular system: S1& S2+ Gastrointestinal system: abd is soft, NT, obese, normal bowel sounds  Central nervous system:  alert & oriented. Moves all extremities  Psychiatry: Judgement and insight appears at baseline. Flat mood and affect    Data Reviewed: I have personally reviewed following labs and imaging studies  CBC: Recent Labs  Lab 01/18/24 2307 01/19/24 0432 01/21/24 0445 01/22/24 0420 01/23/24 0623  WBC 11.5* 8.6 7.2 8.3 7.8  HGB 10.6* 8.5* 8.5* 8.7* 9.0*  HCT 32.9* 26.1* 26.4* 26.7* 27.7*  MCV 90.9 92.2 91.3 91.1 92.3  PLT 375 305 285 314 313   Basic Metabolic Panel: Recent Labs  Lab 01/19/24 0032 01/19/24 0226 01/20/24 0433 01/21/24 0445 01/22/24 0420 01/23/24 0623  NA  --  137 139 140 137 140  K  --  4.6 4.2 4.5 4.5 4.6  CL  --  107 111 111 107 109  CO2  --  20* 21* 23 22 24   GLUCOSE  --  144* 152* 145* 153* 165*  BUN  --  25* 24* 22 23 25*  CREATININE  --  2.02* 1.60* 1.35* 1.31* 1.32*  CALCIUM   --  8.7* 8.1* 8.3* 8.6* 8.7*  MG 2.1  --  2.1 2.1 2.1 2.1   GFR: Estimated Creatinine Clearance: 35.7 mL/min (A) (by C-G formula based on SCr of 1.32 mg/dL (H)). Liver Function Tests: Recent Labs  Lab 01/18/24 2307  AST 54*  ALT 28  ALKPHOS 94  BILITOT 0.4  PROT 6.9  ALBUMIN 3.9   No results for input(s): LIPASE, AMYLASE in the last 168 hours. No results for input(s): AMMONIA in the last 168 hours. Coagulation Profile: No results for input(s): INR, PROTIME in the last 168 hours. Cardiac Enzymes: Recent Labs  Lab 01/19/24 0032 01/20/24 0433 01/21/24 0445  01/22/24 0420  CKTOTAL 719* 418* 242* 138   BNP (last 3 results) No results for input(s): PROBNP in the last 8760 hours. HbA1C: No results for input(s): HGBA1C in the last 72 hours. CBG: Recent Labs  Lab 01/22/24 1208 01/22/24 1726 01/22/24 2209 01/23/24 0821 01/23/24 0902  GLUCAP 136* 249* 181* 155* 157*   Lipid Profile: No results for input(s): CHOL, HDL, LDLCALC, TRIG, CHOLHDL, LDLDIRECT in the last 72 hours. Thyroid Function Tests: No results for input(s): TSH, T4TOTAL, FREET4, T3FREE, THYROIDAB in the last 72 hours.  Anemia Panel: No results for input(s): VITAMINB12, FOLATE, FERRITIN, TIBC, IRON, RETICCTPCT in the last 72 hours. Sepsis Labs: No results for input(s): PROCALCITON, LATICACIDVEN in the last 168 hours.  Recent Results (from the past 240 hours)  Urine Culture (for pregnant, neutropenic or urologic patients or patients with an indwelling urinary catheter)     Status: Abnormal   Collection Time: 01/21/24  2:55 AM   Specimen: Urine, Clean Catch  Result Value Ref Range Status   Specimen Description   Final    URINE, CLEAN CATCH Performed at [facility], [address] [place], [place] [address]    Special Requests   Final    NONE Performed at [facility], [address] [place], [place] [address]    Culture MULTIPLE SPECIES PRESENT, SUGGEST RECOLLECTION (A)  Final   Report Status 01/22/2024 FINAL  Final         Radiology Studies: No results found.       Scheduled Meds:  aspirin  EC  81 mg Oral Daily   escitalopram   5 mg Oral Daily   feeding supplement  237 mL Oral BID BM   heparin   5,000 Units Subcutaneous Q8H   insulin  aspart  0-5 Units Subcutaneous QHS   insulin  aspart  0-9 Units Subcutaneous TID WC   lactulose  30 g Oral BID   Continuous Infusions:  cefTRIAXone  (ROCEPHIN )  IV 1 g (01/23/24 0843)     LOS: 0 days       Anthony CHRISTELLA Pouch, MD [facility] Pager [phone]  If 7PM-7AM, please contact night-coverage 01/23/2024, 9:55 AM

## 2024-01-23 NOTE — Plan of Care (Signed)

## 2024-01-24 DIAGNOSIS — N390 Urinary tract infection, site not specified: Secondary | ICD-10-CM | POA: Diagnosis not present

## 2024-01-24 LAB — CBC
HCT: 28.5 % — ABNORMAL LOW (ref 36.0–46.0)
Hemoglobin: 9.1 g/dL — ABNORMAL LOW (ref 12.0–15.0)
MCH: 29.6 pg (ref 26.0–34.0)
MCHC: 31.9 g/dL (ref 30.0–36.0)
MCV: 92.8 fL (ref 80.0–100.0)
Platelets: 357 K/uL (ref 150–400)
RBC: 3.07 MIL/uL — ABNORMAL LOW (ref 3.87–5.11)
RDW: 17.6 % — ABNORMAL HIGH (ref 11.5–15.5)
WBC: 7.8 K/uL (ref 4.0–10.5)
nRBC: 0 % (ref 0.0–0.2)

## 2024-01-24 LAB — GLUCOSE, CAPILLARY
Glucose-Capillary: 134 mg/dL — ABNORMAL HIGH (ref 70–99)
Glucose-Capillary: 204 mg/dL — ABNORMAL HIGH (ref 70–99)
Glucose-Capillary: 171 mg/dL — ABNORMAL HIGH (ref 70–99)
Glucose-Capillary: 167 mg/dL — ABNORMAL HIGH (ref 70–99)

## 2024-01-24 LAB — BASIC METABOLIC PANEL WITH GFR
Anion gap: 9 (ref 5–15)
BUN: 27 mg/dL — ABNORMAL HIGH (ref 8–23)
CO2: 24 mmol/L (ref 22–32)
Calcium: 8.8 mg/dL — ABNORMAL LOW (ref 8.9–10.3)
Chloride: 106 mmol/L (ref 98–111)
Creatinine, Ser: 1.36 mg/dL — ABNORMAL HIGH (ref 0.44–1.00)
GFR, Estimated: 38 mL/min — ABNORMAL LOW (ref >60)
Glucose, Bld: 170 mg/dL — ABNORMAL HIGH (ref 70–99)
Potassium: 4.9 mmol/L (ref 3.5–5.1)
Sodium: 138 mmol/L (ref 135–145)

## 2024-01-24 LAB — MAGNESIUM: Magnesium: 2.2 mg/dL (ref 1.7–2.4)

## 2024-01-24 NOTE — Progress Notes (Signed)
 Physical Therapy Treatment Patient Details Name: Laura Hatfield MRN: [id number] DOB: [birth year] Today's Date: 01/24/2024   History of Present Illness Laura Hatfield is a 83 y.o. female with a past medical history of CAD, diabetes, hyperlipidemia, presents to the emergency department with generalized weakness/falls.    PT Comments  Pt in bed.   Agrees to session but prefers to return to bed after mobility due to being cold.  She is able to get to EOB with bed features.  Steady in sitting.  Stands to RW with min a x 1 and once up is able to take small cautious steps to [facility]  Voids.  Stands and she is able to attend to her own care needs with set up but sits after care stating she is tired.  After a few minutes rest, she stands again and transfers back to bed and does some standing AROM with walker support before asking to sit.  Retuns to supine with cga x 1.  Once in supine she is educated and returns demo for HEP.  Encouraged to do frequently during the day to help increase strength.  She did not feel she could walk further this session due to general fatigue and weakness.   Discharge recommendations remain appropriate.  If rehab is not available to her she would benefit from RW, [facility], and wheelchair as she is unable to walk household distances at this time.  All HH supports and +1 for general mobility.     If plan is discharge home, recommend the following: A little help with walking and/or transfers;A little help with bathing/dressing/bathroom;Assist for transportation;Assistance with cooking/housework;Help with stairs or ramp for entrance   Can travel by private vehicle        Equipment Recommendations  None recommended by PT    Recommendations for Other Services       Precautions / Restrictions Precautions Precautions: Fall Recall of Precautions/Restrictions: Impaired Precaution/Restrictions Comments: does wear brace on LLE, pt reports this is baseline. Restrictions Weight  Bearing Restrictions Per Provider Order: No     Mobility  Bed Mobility Overal bed mobility: Needs Assistance Bed Mobility: Supine to Sit, Sit to Supine     Supine to sit: Supervision, HOB elevated, Used rails Sit to supine: Contact guard assist, Used rails, HOB elevated     Patient Response: Cooperative  Transfers Overall transfer level: Needs assistance Equipment used: Rolling walker (2 wheels) Transfers: Sit to/from Stand Sit to Stand: Contact guard assist, Min assist                Ambulation/Gait Ambulation/Gait assistance: Min assist Gait Distance (Feet): 3 Feet Assistive device: Rolling walker (2 wheels) Gait Pattern/deviations: Step-to pattern Gait velocity: dec     General Gait Details: able to take slow cautious steps to and from [facility] but self limited due to fatigue   Stairs             Wheelchair Mobility     Tilt Bed Tilt Bed Patient Response: Cooperative  Modified Rankin (Stroke Patients Only)       Balance Overall balance assessment: Needs assistance Sitting-balance support: Feet supported, No upper extremity supported Sitting balance-Leahy Scale: Good     Standing balance support: Bilateral upper extremity supported, During functional activity Standing balance-Leahy Scale: Fair Standing balance comment: minA for standing balace                            Communication Communication Communication: No  apparent difficulties  Cognition Arousal: Alert Behavior During Therapy: WFL for tasks assessed/performed   PT - Cognitive impairments: No apparent impairments                         Following commands: Intact      Cueing Cueing Techniques: Verbal cues  Exercises Other Exercises Other Exercises: standing AROM with walker and cga/min a x 1, supine AROM and education for HEP    General Comments        Pertinent Vitals/Pain Pain Assessment Pain Assessment: Faces Faces Pain Scale: Hurts a little  bit Pain Location: BLE Pain Descriptors / Indicators: Aching, Burning, Discomfort Pain Intervention(s): Limited activity within patient's tolerance, Monitored during session, Repositioned    Home Living                          Prior Function            PT Goals (current goals can now be found in the care plan section) Progress towards PT goals: Progressing toward goals    Frequency    Min 2X/week      PT Plan      Co-evaluation              AM-PAC PT 6 Clicks Mobility   Outcome Measure  Help needed turning from your back to your side while in a flat bed without using bedrails?: A Little Help needed moving from lying on your back to sitting on the side of a flat bed without using bedrails?: A Little Help needed moving to and from a bed to a chair (including a wheelchair)?: A Little Help needed standing up from a chair using your arms (e.g., wheelchair or bedside chair)?: A Little Help needed to walk in hospital room?: A Little Help needed climbing 3-5 steps with a railing? : Total 6 Click Score: 16    End of Session Equipment Utilized During Treatment: Gait belt Activity Tolerance: Patient tolerated treatment well Patient left: in bed;with call bell/phone within reach;with bed alarm set Nurse Communication: Mobility status PT Visit Diagnosis: Unsteadiness on feet (R26.81);Other abnormalities of gait and mobility (R26.89);Repeated falls (R29.6);Muscle weakness (generalized) (M62.81);History of falling (Z91.81);Difficulty in walking, not elsewhere classified (R26.2)     Time: 8697-8680 PT Time Calculation (min) (ACUTE ONLY): 17 min  Charges:    $Therapeutic Activity: 8-22 mins PT General Charges $$ ACUTE PT VISIT: 1 Visit                   Lauraine Gills, PTA 01/24/24, 1:42 PM

## 2024-01-24 NOTE — TOC Progression Note (Signed)
 Transition of Care [facility]) - Progression Note    Patient Details  Name: Laura Hatfield MRN: [id number] Date of Birth: February 24, 1941  Transition of Care [facility]) CM/SW Contact  Jariya Reichow L Ieasha [place], [place] Phone Number: 01/24/2024, 10:40 AM  Clinical Narrative:     Chart reviewed. Patient has no bed offers. Bed search extended to [facility].   Expected Discharge Plan: Home w Home Health Services Barriers to Discharge: Continued Medical Work up               Expected Discharge Plan and Services   Discharge Planning Services: CM Consult Post Acute Care Choice: Home Health, Skilled Nursing Facility Living arrangements for the past 2 months: Single Family Home                                       Social Drivers of Health (SDOH) Interventions SDOH Screenings   Food Insecurity: No Food Insecurity (01/20/2024)  Housing: Low Risk  (01/20/2024)  Transportation Needs: No Transportation Needs (01/20/2024)  Utilities: Not At Risk (01/20/2024)  Alcohol Screen: Low Risk  (03/29/2018)  Financial Resource Strain: Low Risk  (11/20/2023)   Received from [facility]  Social Connections: Socially Isolated (01/20/2024)  Tobacco Use: Low Risk  (01/23/2024)    Readmission Risk Interventions     No data to display

## 2024-01-24 NOTE — Plan of Care (Signed)
  Problem: Coping: Goal: Ability to adjust to condition or change in health will improve Outcome: Progressing   Problem: Fluid Volume: Goal: Ability to maintain a balanced intake and output will improve Outcome: Progressing   Problem: Health Behavior/Discharge Planning: Goal: Ability to manage health-related needs will improve Outcome: Progressing   Problem: Metabolic: Goal: Ability to maintain appropriate glucose levels will improve Outcome: Progressing   Problem: Skin Integrity: Goal: Risk for impaired skin integrity will decrease Outcome: Progressing

## 2024-01-24 NOTE — Progress Notes (Signed)
 PROGRESS NOTE    Laura Hatfield  FMW:[id number] DOB: [birth year] DOA: 01/18/2024 PCP: Treasa Pin, MD   Assessment & Plan:   Principal Problem:   Myocardial injury Active Problems:   CAD (coronary artery disease)   Fall at home, initial encounter   Acute renal failure superimposed on stage 3b chronic kidney disease (HCC)   HLD (hyperlipidemia)   Type II diabetes mellitus with renal manifestations (HCC)   Leukocytosis   HTN (hypertension)   Depression  Assessment and Plan: Fall: likely secondary to weakness. PT/OT recs SNF but no bed offers yet   UTI: UA is positive. Urine cx was not sent on day of admission. Urine cx shows containment. Continue on IV rocephin  x 5 days to complete the course.    AKI on CKDIIIb: likely secondary rhabdomyolysis secondary to fall. Cr is labile    Rhabdomyolysis: likely secondary to fall at home. Resolved    HLD: on evolocumab at home   DM2: well controlled, HbA1c 6.6. Continue on SSI w/ accuchecks    Leukocytosis: resolved  HTN: not taking any anti-HTN meds as per med rec. BP is WNL currently   Depression: severity unknown. Continue on lexapro     Troponins elevated: likely secondary to demand ischemia. No CP  Hx of CAD: continue on aspirin , evolocumab   ACD: likely secondary to CKD. No need for a transfusion currently   Constipation: resolved   DVT prophylaxis: heparin   Code Status: full  Family Communication:  Disposition Plan: likely d/c to SNF  Status is: Observation The patient remains OBS appropriate and will d/c before 2 midnights.  Needs SNF placement, still waiting on bed offers as per CM     Level of care: Telemetry Consultants:    Procedures:  Antimicrobials:   Subjective: Pt c/o fatigue   Objective: Vitals:   01/23/24 1700 01/23/24 1924 01/24/24 0400 01/24/24 0743  BP: (!) 127/58 124/63 (!) 133/56 (!) 124/54  Pulse: 81 80 82 74  Resp: 18 18 18 16   Temp: 98.6 F (37 C) 98.7 F (37.1 C) 98 F  (36.7 C) 98.4 F (36.9 C)  TempSrc:  Oral Oral   SpO2: 96% 97% 98% 98%  Weight:        Intake/Output Summary (Last 24 hours) at 01/24/2024 0843 Last data filed at 01/23/2024 1415 Gross per 24 hour  Intake 240 ml  Output 400 ml  Net -160 ml   Filed Weights   01/23/24 0527  Weight: 89.2 kg    Examination:  General exam:  appears calm & comfortable  Respiratory system: clear breath sounds b/l Cardiovascular system: S1 & S2+ Gastrointestinal system: abd is soft, NT, obese, normal bowel sounds  Central nervous system: alert & oriented. Moves all extremities  Psychiatry: Judgement and insight appears at baseline. Appropriate mood and affect    Data Reviewed: I have personally reviewed following labs and imaging studies  CBC: Recent Labs  Lab 01/19/24 0432 01/21/24 0445 01/22/24 0420 01/23/24 0623 01/24/24 0449  WBC 8.6 7.2 8.3 7.8 7.8  HGB 8.5* 8.5* 8.7* 9.0* 9.1*  HCT 26.1* 26.4* 26.7* 27.7* 28.5*  MCV 92.2 91.3 91.1 92.3 92.8  PLT 305 285 314 313 357   Basic Metabolic Panel: Recent Labs  Lab 01/20/24 0433 01/21/24 0445 01/22/24 0420 01/23/24 0623 01/24/24 0449  NA 139 140 137 140 138  K 4.2 4.5 4.5 4.6 4.9  CL 111 111 107 109 106  CO2 21* 23 22 24 24   GLUCOSE 152* 145* 153* 165* 170*  BUN 24* 22 23 25* 27*  CREATININE 1.60* 1.35* 1.31* 1.32* 1.36*  CALCIUM  8.1* 8.3* 8.6* 8.7* 8.8*  MG 2.1 2.1 2.1 2.1 2.2   GFR: Estimated Creatinine Clearance: 34.7 mL/min (A) (by C-G formula based on SCr of 1.36 mg/dL (H)). Liver Function Tests: Recent Labs  Lab 01/18/24 2307  AST 54*  ALT 28  ALKPHOS 94  BILITOT 0.4  PROT 6.9  ALBUMIN 3.9   No results for input(s): LIPASE, AMYLASE in the last 168 hours. No results for input(s): AMMONIA in the last 168 hours. Coagulation Profile: No results for input(s): INR, PROTIME in the last 168 hours. Cardiac Enzymes: Recent Labs  Lab 01/19/24 0032 01/20/24 0433 01/21/24 0445 01/22/24 0420  CKTOTAL 719*  418* 242* 138   BNP (last 3 results) No results for input(s): PROBNP in the last 8760 hours. HbA1C: No results for input(s): HGBA1C in the last 72 hours. CBG: Recent Labs  Lab 01/23/24 0902 01/23/24 1158 01/23/24 1253 01/23/24 1926 01/24/24 0745  GLUCAP 157* 170* 178* 186* 134*   Lipid Profile: No results for input(s): CHOL, HDL, LDLCALC, TRIG, CHOLHDL, LDLDIRECT in the last 72 hours. Thyroid  Function Tests: No results for input(s): TSH, T4TOTAL, FREET4, T3FREE, THYROIDAB in the last 72 hours.  Anemia Panel: No results for input(s): VITAMINB12, FOLATE, FERRITIN, TIBC, IRON, RETICCTPCT in the last 72 hours. Sepsis Labs: No results for input(s): PROCALCITON, LATICACIDVEN in the last 168 hours.  Recent Results (from the past 240 hours)  Urine Culture (for pregnant, neutropenic or urologic patients or patients with an indwelling urinary catheter)     Status: Abnormal   Collection Time: 01/21/24  2:55 AM   Specimen: Urine, Clean Catch  Result Value Ref Range Status   Specimen Description   Final    URINE, CLEAN CATCH Performed at [facility], [address] [place], [place] [address]    Special Requests   Final    NONE Performed at [facility], [address] [place], [place] [address]    Culture MULTIPLE SPECIES PRESENT, SUGGEST RECOLLECTION (A)  Final   Report Status 01/22/2024 FINAL  Final         Radiology Studies: No results found.       Scheduled Meds:  aspirin  EC  81 mg Oral Daily   escitalopram   5 mg Oral Daily   feeding supplement  237 mL Oral BID BM   heparin   5,000 Units Subcutaneous Q8H   insulin  aspart  0-5 Units Subcutaneous QHS   insulin  aspart  0-9 Units Subcutaneous TID WC   lactulose   30 g Oral BID   Continuous Infusions:  cefTRIAXone  (ROCEPHIN )  IV 1 g (01/23/24 0843)     LOS: 0 days       Anthony CHRISTELLA Pouch, MD [facility] Pager [phone]  If  7PM-7AM, please contact night-coverage 01/24/2024, 8:43 AM

## 2024-01-25 DIAGNOSIS — N390 Urinary tract infection, site not specified: Secondary | ICD-10-CM | POA: Diagnosis not present

## 2024-01-25 LAB — BASIC METABOLIC PANEL WITH GFR
Anion gap: 9 (ref 5–15)
BUN: 27 mg/dL — ABNORMAL HIGH (ref 8–23)
CO2: 25 mmol/L (ref 22–32)
Calcium: 9 mg/dL (ref 8.9–10.3)
Chloride: 105 mmol/L (ref 98–111)
Creatinine, Ser: 1.26 mg/dL — ABNORMAL HIGH (ref 0.44–1.00)
GFR, Estimated: 42 mL/min — ABNORMAL LOW (ref >60)
Glucose, Bld: 180 mg/dL — ABNORMAL HIGH (ref 70–99)
Potassium: 4.6 mmol/L (ref 3.5–5.1)
Sodium: 138 mmol/L (ref 135–145)

## 2024-01-25 LAB — CBC
HCT: 29.3 % — ABNORMAL LOW (ref 36.0–46.0)
Hemoglobin: 9.3 g/dL — ABNORMAL LOW (ref 12.0–15.0)
MCH: 29.5 pg (ref 26.0–34.0)
MCHC: 31.7 g/dL (ref 30.0–36.0)
MCV: 93 fL (ref 80.0–100.0)
Platelets: 373 K/uL (ref 150–400)
RBC: 3.15 MIL/uL — ABNORMAL LOW (ref 3.87–5.11)
RDW: 17.6 % — ABNORMAL HIGH (ref 11.5–15.5)
WBC: 8.7 K/uL (ref 4.0–10.5)
nRBC: 0 % (ref 0.0–0.2)

## 2024-01-25 LAB — GLUCOSE, CAPILLARY
Glucose-Capillary: 177 mg/dL — ABNORMAL HIGH (ref 70–99)
Glucose-Capillary: 179 mg/dL — ABNORMAL HIGH (ref 70–99)
Glucose-Capillary: 157 mg/dL — ABNORMAL HIGH (ref 70–99)

## 2024-01-25 MED ORDER — CIPROFLOXACIN HCL 500 MG PO TABS: 500.0000 mg | ORAL_TABLET | Freq: Two times a day (BID) | ORAL | 0 refills | Status: AC

## 2024-01-25 MED ORDER — SIMETHICONE 80 MG PO CHEW
80.0000 mg | CHEWABLE_TABLET | Freq: Four times a day (QID) | ORAL | Status: DC | PRN
  Administered 2024-01-25: 80 mg via ORAL
  Filled 2024-01-25: qty 1

## 2024-01-25 NOTE — TOC Progression Note (Addendum)
 Transition of Care [facility]) - Progression Note    Patient Details  Name: Laura Hatfield MRN: [id number] Date of Birth: 1941/02/03  Transition of Care [facility]) CM/SW Contact  Dalia GORMAN Fuse, RN Phone Number: 01/25/2024, 10:55 AM  Clinical Narrative:    Approved PlanAuthID: [id number] Dates: 12/1-12/04/2023 Next REview Date: 01/27/2024   Awaiting call back from [facility] at [facility] with Room Number.   Expected Discharge Plan: Home w Home Health Services Barriers to Discharge: Continued Medical Work up               Expected Discharge Plan and Services   Discharge Planning Services: CM Consult Post Acute Care Choice: Home Health, Skilled Nursing Facility Living arrangements for the past 2 months: Single Family Home                                       Social Drivers of Health (SDOH) Interventions SDOH Screenings   Food Insecurity: No Food Insecurity (01/20/2024)  Housing: Low Risk  (01/20/2024)  Transportation Needs: No Transportation Needs (01/20/2024)  Utilities: Not At Risk (01/20/2024)  Alcohol Screen: Low Risk  (03/29/2018)  Financial Resource Strain: Low Risk  (11/20/2023)   Received from [facility]  Social Connections: Socially Isolated (01/20/2024)  Tobacco Use: Low Risk  (01/23/2024)    Readmission Risk Interventions     No data to display

## 2024-01-25 NOTE — Progress Notes (Signed)
 Occupational Therapy Treatment Patient Details Name: Laura Hatfield MRN: [id number] DOB: [birth year] Today's Date: 01/25/2024   History of present illness Laura Hatfield is a 83 y.o. female with a past medical history of CAD, diabetes, hyperlipidemia, presents to the emergency department with generalized weakness/falls.   OT comments  Pt is supine in bed on arrival and agreeable to OT session. She reports mild BLE pain. Pt performed bed mobility with Min/CGA for BLE management. Pt required Min A for STS from EOB and BSC this session with cues for hand placement and forward scooting/weight shift. She ambulated ~8-10 ft using RW with Min A for technique and safety d/t balance deficits and weakness. She fatigues easily with all activity. Toileting tasks performed on BSC requiring Min A to maintain dynamic standing balance during peri-care and hand hygiene. Denies dizziness throughout session. Pt returned to bed with all needs in place and will cont to require skilled acute OT services to maximize her safety and IND to return to PLOF.        If plan is discharge home, recommend the following:  A lot of help with bathing/dressing/bathroom;Supervision due to cognitive status;Help with stairs or ramp for entrance;Assist for transportation;Assistance with cooking/housework;A lot of help with walking and/or transfers   Equipment Recommendations  None recommended by OT    Recommendations for Other Services      Precautions / Restrictions Precautions Precautions: Fall Recall of Precautions/Restrictions: Impaired Precaution/Restrictions Comments: does wear brace on LLE, pt reports this is baseline. Restrictions Weight Bearing Restrictions Per Provider Order: No       Mobility Bed Mobility Overal bed mobility: Needs Assistance Bed Mobility: Supine to Sit, Sit to Supine     Supine to sit: Supervision, HOB elevated, Used rails Sit to supine: Used rails, HOB elevated, Min assist    General bed mobility comments: supervision with cues for bed rail use to reach EOB, Min A for BLE managemen to return to supine    Transfers Overall transfer level: Needs assistance Equipment used: Rolling walker (2 wheels) Transfers: Sit to/from Stand Sit to Stand: Contact guard assist, Min assist     Step pivot transfers: Min assist     General transfer comment: pt stood from EOB and BSC during session with Min A and cues for hand placement and forward scoot, ambulated short distance ~8-10 ft with RW and Min A safety; standing peri-care and hand hygiene performed with unilateral support on RW at all times and 1 assist externally to maintain dynamic balance     Balance Overall balance assessment: Needs assistance Sitting-balance support: Feet supported, No upper extremity supported Sitting balance-Leahy Scale: Good     Standing balance support: During functional activity, Single extremity supported Standing balance-Leahy Scale: Poor Standing balance comment: requires external support x1 and RW for dynamic standing balance                           ADL either performed or assessed with clinical judgement   ADL Overall ADL's : Needs assistance/impaired                         Toilet Transfer: BSC/3in1;Rolling walker (2 wheels);Stand-pivot;Cueing for sequencing;Cueing for safety;Minimal assistance   Toileting- Clothing Manipulation and Hygiene: Minimal assistance;Sit to/from stand;Contact guard assist Toileting - Clothing Manipulation Details (indicate cue type and reason): able to perform standing peri-care with Min/CGA for balance in standing  Extremity/Trunk Assessment              Vision       Rest[employer] Communication: No apparent difficulties   Cognition Arousal: Alert Behavior During Therapy: WFL for tasks assessed/performed                                  Following commands: Intact        Cueing   Cueing Techniques: Verbal cues  Exercises      Shoulder Instructions       General Comments no dizziness throughout session    Pertinent Vitals/ Pain       Pain Assessment Pain Assessment: Faces Faces Pain Scale: Hurts a little bit Pain Location: BLE Pain Descriptors / Indicators: Aching, Burning, Discomfort Pain Intervention(s): Monitored during session, Repositioned, Limited activity within patient's tolerance  Home Living                                          Prior Functioning/Environment              Frequency  Min 1X/week        Progress Toward Goals  OT Goals(current goals can now be found in the care plan section)  Progress towards OT goals: Progressing toward goals  Acute Rehab OT Goals Patient Stated Goal: feel better OT Goal Formulation: With patient Time For Goal Achievement: 02/02/24 Potential to Achieve Goals: Good  Plan      Co-evaluation                 AM-PAC OT 6 Clicks Daily Activity     Outcome Measure   Help from another person eating meals?: A Little Help from another person taking care of personal grooming?: A Little Help from another person toileting, which includes using toliet, bedpan, or urinal?: A Lot Help from another person bathing (including washing, rinsing, drying)?: A Lot Help from another person to put on and taking off regular upper body clothing?: A Little Help from another person to put on and taking off regular lower body clothing?: A Lot 6 Click Score: 15    End of Session Equipment Utilized During Treatment: Rolling walker (2 wheels);Gait belt  OT Visit Diagnosis: Repeated falls (R29.6);Muscle weakness (generalized) (M62.81)   Activity Tolerance Patient tolerated treatment well   Patient Left in bed;with call bell/phone within reach;with bed alarm set   Nurse Communication Mobility status        Time: 8480-8464 OT Time  Calculation (min): 16 min  Charges: OT General Charges $OT Visit: 1 Visit OT Treatments $Self Care/Home Management : 8-22 mins  Brion Sossamon Chrismon, OTR/L  01/25/24, 3:51 PM   Tashaun Obey E Chrismon 01/25/2024, 3:48 PM

## 2024-01-25 NOTE — TOC Transition Note (Addendum)
 Transition of Care [facility]) - Discharge Note   Patient Details  Name: Aleynah Rocchio MRN: [id number] Date of Birth: 1940-12-15  Transition [facility]) CM/SW Contact:  Dalia GORMAN Fuse, RN Phone Number: 01/25/2024, 11:09 AM   Clinical Narrative:    The patient is medically clear to discharge to [facility] in [place]. [place] to transport.   Nurse to call report to [facility] [phone]. RM [phone]   Final next level of care: Skilled Nursing Facility Barriers to Discharge: Barriers Resolved   Patient Goals and CMS Choice Patient states their goals for this hospitalization and ongoing recovery are:: Patient wants to get back on her feet and will do whatever therapy recommends, home health or SNF CMS Medicare.gov Compare Post Acute Care list provided to:: Patient Choice offered to / list presented to : Patient      Discharge Placement              Patient chooses bed at: [facility] [place] Patient to be transferred to facility by: [facility]      Discharge Plan and Services Additional resources added to the After Visit Summary for     Discharge Planning Services: CM Consult Post Acute Care Choice: Home Health, Skilled Nursing Facility                               Social Drivers of Health (SDOH) Interventions SDOH Screenings   Food Insecurity: No Food Insecurity (01/20/2024)  Housing: Low Risk  (01/20/2024)  Transportation Needs: No Transportation Needs (01/20/2024)  Utilities: Not At Risk (01/20/2024)  Alcohol Screen: Low Risk  (03/29/2018)  Financial Resource Strain: Low Risk  (11/20/2023)   Received from [facility]  Social Connections: Socially Isolated (01/20/2024)  Tobacco Use: Low Risk  (01/23/2024)     Readmission Risk Interventions     No data to display

## 2024-01-25 NOTE — Discharge Summary (Signed)
 Physician Discharge Summary  Laura Hatfield FMW:[id number] DOB: 05/10/1940 DOA: 01/18/2024  PCP: Treasa Pin, MD  Admit date: 01/18/2024 Discharge date: 01/25/2024  Admitted From: home Disposition:  home  Recommendations for Outpatient Follow-up:  Follow up with PCP in 1-2 weeks   Home Health: no  Equipment/Devices:  Discharge Condition: stable  CODE STATUS: full  Diet recommendation:  Carb Modified  Brief/Interim Summary: HPI was taken from Dr. Hilma: Laura Hatfield is a 83 y.o. female with medical history significant of HTN, HLD, CAD, CKD-3b, peripheral neuropathy, UTI, depression, statin allergy, who presents with fall, weakness and upper leg pain.   According to EMS, family states patient has had multiple recent falls, but pt states that she just had one fall recently and fall in April which caused left ankle fracture. She is wearing brace in left ankle. She reports pain in bilateral upper legs with muscle spasm.  She has peripheral neuropathy with numbness in both lower legs.  No facial droop or slurred speech.  Patient does not have chest pain, cough, SOB.  No nausea, vomiting, diarrhea or abdominal pain.  She states that she has chronic urinary frequency, but no dysuria or burning with urination.  Patient is alert and oriented x 3.   Data reviewed independently and ED Course: pt was found to have WBC 11.5, troponin 52, worsening renal function with creatinine 2.07, BUN 23 and a GFR 23 (recent baseline creatinine 1.29 on 12/21/2023). Temperature normal, blood pressure 151/79, heart rate 99, RR 16, oxygen saturation 100% on room air.  Patient is placed in telemetry bed for observation.   CT-head: 1. No acute intracranial abnormality. 2. Atrophy and chronic small vessel disease throughout the deep white matter. 3. 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  Discharge Diagnoses:  Principal Problem:   Myocardial injury Active Problems:   CAD (coronary artery  disease)   Fall at home, initial encounter   Acute renal failure superimposed on stage 3b chronic kidney disease (HCC)   HLD (hyperlipidemia)   Type II diabetes mellitus with renal manifestations (HCC)   Leukocytosis   HTN (hypertension)   Depression  Fall: likely secondary to weakness. PT/OT recs SNF    UTI: UA is positive. Urine cx was not sent on day of admission. Urine cx shows containment. Continue on IV rocephin  while inpatient and d/c on po cipro  at d/c to complete the course   AKI on CKDIIIb: likely secondary rhabdomyolysis secondary to fall. Cr is labile    Rhabdomyolysis: likely secondary to fall at home. Resolved    HLD: on evolocumab at home    DM2: well controlled, HbA1c 6.6. Restart home anti-DM meds at d/c. Blood sugar check ACHS    Leukocytosis: resolved    Depression: severity unknown. No longer takes lexapro  at home as per med rec   Troponins elevated: likely secondary to demand ischemia. No CP   Hx of CAD: continue on aspirin , evolocumab    ACD: likely secondary to CKD. No need for a transfusion currently    Constipation: resolved  Discharge Instructions  Discharge Instructions     Diet Carb Modified   Complete by: As directed    Discharge instructions   Complete by: As directed    F/u w/ PCP in 1-2 weeks   Increase activity slowly   Complete by: As directed    No wound care   Complete by: As directed       Allergies as of 01/25/2024  Reactions   Atorvastatin Other (See Comments)   Dr told her taking these were affecting her liver Other Reaction(s): Muscle Pain, Not available   Losartan Other (See Comments)   Syncope   Crestor  [rosuvastatin ]    Lipitor [atorvastatin Calcium ]    Ezetimibe Other (See Comments)   Arthralgia   Lisinopril Cough   Penicillins Rash           Medication List     STOP taking these medications    colestipol  1 g tablet Commonly known as: COLESTID    insulin  glargine-yfgn 100 UNIT/ML  injection Commonly known as: SEMGLEE        TAKE these medications    acetaminophen  500 MG tablet Commonly known as: TYLENOL  Take 500 mg by mouth every 6 (six) hours as needed.   aspirin  EC 81 MG tablet Take 81 mg by mouth daily.   cholecalciferol  1000 units tablet Commonly known as: VITAMIN D  Take 1,000 Units by mouth at bedtime.   ciprofloxacin  500 MG tablet Commonly known as: Cipro  Take 1 tablet (500 mg total) by mouth 2 (two) times daily for 1 day.   cyanocobalamin  1000 MCG tablet Commonly known as: VITAMIN B12 Take 1,000 mcg by mouth daily.   FreeStyle Libre 2 Sensor Misc   NovoLIN 70/30 Kwikpen (70-30) 100 UNIT/ML KwikPen Generic drug: insulin  isophane & regular human KwikPen Inject 42 Units into the skin daily.   polyethylene glycol 17 g packet Commonly known as: MIRALAX  / GLYCOLAX  Take 17 g by mouth daily.   Semglee  (yfgn) 100 UNIT/ML Pen Generic drug: insulin  glargine Inject 18 Units into the skin at bedtime.        Contact information for after-discharge care     Destination     [facility] [place], [place]. [place]   Service: Skilled Nursing Contact information: [address] [place]  [address] [phone]                    Allergies  Allergen Reactions   Atorvastatin Other (See Comments)    Dr told her taking these were affecting her liver  Other Reaction(s): Muscle Pain, Not available   Losartan Other (See Comments)    Syncope   Crestor  [Rosuvastatin ]    Lipitor [Atorvastatin Calcium ]    Ezetimibe Other (See Comments)    Arthralgia   Lisinopril Cough   Penicillins Rash         Consultations:    Procedures/Studies: DG FEMUR, MIN 2 VIEWS RIGHT Result Date: 01/19/2024 EXAM: 2 VIEW(S) Xray of the Right Femur 01/19/2024 02:01:42 AM COMPARISON: None available. CLINICAL HISTORY: 221778 Upper leg pain 221778 FINDINGS: BONES AND JOINTS: No acute fracture. No focal osseous lesion. No joint dislocation. SOFT  TISSUES: The soft tissues are unremarkable. IMPRESSION: 1. No significant abnormality. Electronically signed by: Franky Crease MD 01/19/2024 02:04 AM EST RP Workstation: [id number]   DG FEMUR MIN 2 VIEWS LEFT Result Date: 01/19/2024 EXAM: 2 VIEW(S) Xray of the Left Femur 01/19/2024 02:01:42 AM COMPARISON: None available. CLINICAL HISTORY: 221778 Upper leg pain 221778 FINDINGS: BONES AND JOINTS: No acute fracture. No focal osseous lesion. No joint dislocation. SOFT TISSUES: The soft tissues are unremarkable. IMPRESSION: 1. No significant abnormality. Electronically signed by: Franky Crease MD 01/19/2024 02:04 AM EST RP Workstation: [id number]   CT HEAD WO CONTRAST ( ) Result Date: 01/18/2024 EXAM: CT HEAD WITHOUT CONTRAST 01/18/2024 10:44:17 PM TECHNIQUE: CT of the head was performed without the administration of intravenous contrast. Automated exposure control, iterative reconstruction, and/or weight based adjustment of  the mA/kV was utilized to reduce the radiation dose to as low as reasonably achievable. COMPARISON: 09/21/2023 CLINICAL HISTORY: Mental status change, unknown cause FINDINGS: BRAIN AND VENTRICLES: No acute hemorrhage. No evidence of acute infarct. No hydrocephalus. No extra-axial collection. No mass effect or midline shift. There is atrophy and chronic small vessel disease throughout the deep white matter. Old right frontal infarct. Old left cerebellar infarct. ORBITS: No acute abnormality. SINUSES: No acute abnormality. SOFT TISSUES AND SKULL: No acute soft tissue abnormality. No skull fracture. IMPRESSION: 1. No acute intracranial abnormality. 2. Atrophy and chronic small vessel disease throughout the deep white matter. 3. 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 Electronically signed by: Franky Crease MD 01/18/2024 10:48 PM EST RP Workstation: [id number]   (Echo, Carotid, EGD, Colonoscopy, ERCP)    Subjective: Pt c/o fatigue   Discharge Exam: Vitals:   01/25/24 0402  01/25/24 0754  BP: (!) 140/64 (!) 124/55  Pulse: 77 72  Resp: 18 15  Temp: 98.8 F (37.1 C) 98 F (36.7 C)  SpO2: 99% 98%   Vitals:   01/24/24 1611 01/24/24 2006 01/25/24 0402 01/25/24 0754  BP: (!) 140/63 (!) 107/46 (!) 140/64 (!) 124/55  Pulse: 83 78 77 72  Resp: 16 18 18 15   Temp: 98.6 F (37 C) 98.2 F (36.8 C) 98.8 F (37.1 C) 98 F (36.7 C)  TempSrc:  Oral Oral   SpO2: 98% 97% 99% 98%  Weight:        General: Pt is alert, awake, not in acute distress Cardiovascular: S1/S2 +, no rubs, no gallops Respiratory: CTA bilaterally, no wheezing, no rhonchi Abdominal: Soft, NT, obese, bowel sounds + Extremities: no cyanosis    The results of significant diagnostics from this hospitalization (including imaging, microbiology, ancillary and laboratory) are listed below for reference.     Microbiology: Recent Results (from the past 240 hours)  Urine Culture (for pregnant, neutropenic or urologic patients or patients with an indwelling urinary catheter)     Status: Abnormal   Collection Time: 01/21/24  2:55 AM   Specimen: Urine, Clean Catch  Result Value Ref Range Status   Specimen Description   Final    URINE, CLEAN CATCH Performed at [facility], [address], [place], [place] [address]    Special Requests   Final    NONE Performed at [facility], [address] [place], [place] [address]    Culture MULTIPLE SPECIES PRESENT, SUGGEST RECOLLECTION (A)  Final   Report Status 01/22/2024 FINAL  Final     Labs: BNP (last 3 results) No results for input(s): BNP in the last 8760 hours. Basic Metabolic Panel: Recent Labs  Lab 01/20/24 0433 01/21/24 0445 01/22/24 0420 01/23/24 0623 01/24/24 0449 01/25/24 0455  NA 139 140 137 140 138 138  K 4.2 4.5 4.5 4.6 4.9 4.6  CL 111 111 107 109 106 105  CO2 21* 23 22 24 24 25   GLUCOSE 152* 145* 153* 165* 170* 180*  BUN 24* 22 23 25* 27* 27*  CREATININE 1.60* 1.35* 1.31* 1.32* 1.36* 1.26*  CALCIUM   8.1* 8.3* 8.6* 8.7* 8.8* 9.0  MG 2.1 2.1 2.1 2.1 2.2  --    Liver Function Tests: Recent Labs  Lab 01/18/24 2307  AST 54*  ALT 28  ALKPHOS 94  BILITOT 0.4  PROT 6.9  ALBUMIN 3.9   No results for input(s): LIPASE, AMYLASE in the last 168 hours. No results for input(s): AMMONIA in the last 168 hours. CBC: Recent  Labs  Lab 01/21/24 0445 01/22/24 0420 01/23/24 0623 01/24/24 0449 01/25/24 0455  WBC 7.2 8.3 7.8 7.8 8.7  HGB 8.5* 8.7* 9.0* 9.1* 9.3*  HCT 26.4* 26.7* 27.7* 28.5* 29.3*  MCV 91.3 91.1 92.3 92.8 93.0  PLT 285 314 313 357 373   Cardiac Enzymes: Recent Labs  Lab 01/19/24 0032 01/20/24 0433 01/21/24 0445 01/22/24 0420  CKTOTAL 719* 418* 242* 138   BNP: Invalid input(s): POCBNP CBG: Recent Labs  Lab 01/24/24 1145 01/24/24 1612 01/24/24 2008 01/25/24 0755 01/25/24 1202  GLUCAP 204* 171* 167* 177* 179*   D-Dimer No results for input(s): DDIMER in the last 72 hours. Hgb A1c No results for input(s): HGBA1C in the last 72 hours. Lipid Profile No results for input(s): CHOL, HDL, LDLCALC, TRIG, CHOLHDL, LDLDIRECT in the last 72 hours. Thyroid function studies No results for input(s): TSH, T4TOTAL, T3FREE, THYROIDAB in the last 72 hours.  Invalid input(s): FREET3 Anemia work up No results for input(s): VITAMINB12, FOLATE, FERRITIN, TIBC, IRON, RETICCTPCT in the last 72 hours. Urinalysis    Component Value Date/Time   COLORURINE AMBER (A) 01/18/2024 0437   APPEARANCEUR CLOUDY (A) 01/18/2024 0437   APPEARANCEUR CLOUDY 01/20/2014 1357   LABSPEC 1.024 01/18/2024 0437   LABSPEC 1.020 01/20/2014 1357   PHURINE 5.0 01/18/2024 0437   GLUCOSEU NEGATIVE 01/18/2024 0437   GLUCOSEU NEGATIVE 01/20/2014 1357   HGBUR NEGATIVE 01/18/2024 0437   BILIRUBINUR NEGATIVE 01/18/2024 0437   BILIRUBINUR NEGATIVE 01/20/2014 1357   KETONESUR 5 (A) 01/18/2024 0437   PROTEINUR 30 (A) 01/18/2024 0437   NITRITE NEGATIVE  01/18/2024 0437   LEUKOCYTESUR LARGE (A) 01/18/2024 0437   LEUKOCYTESUR MODERATE 01/20/2014 1357   Sepsis Labs Recent Labs  Lab 01/22/24 0420 01/23/24 0623 01/24/24 0449 01/25/24 0455  WBC 8.3 7.8 7.8 8.7   Microbiology Recent Results (from the past 240 hours)  Urine Culture (for pregnant, neutropenic or urologic patients or patients with an indwelling urinary catheter)     Status: Abnormal   Collection Time: 01/21/24  2:55 AM   Specimen: Urine, Clean Catch  Result Value Ref Range Status   Specimen Description   Final    URINE, CLEAN CATCH Performed at [facility], [address], [place], [place] [address]    Special Requests   Final    NONE Performed at [facility], [address], [place], [place] [address]    Culture MULTIPLE SPECIES PRESENT, SUGGEST RECOLLECTION (A)  Final   Report Status 01/22/2024 FINAL  Final     Time coordinating discharge: 33  minutes  SIGNED:   Anthony CHRISTELLA Pouch, MD  [facility] 01/25/2024, 12:30 PM Pager   If 7PM-7AM, please contact night-coverage [email or web address]

## 2024-01-25 NOTE — TOC Progression Note (Signed)
 Transition of Care [facility]) - Progression Note    Patient Details  Name: Laura Hatfield MRN: [id number] Date of Birth: 11-02-1940  Transition of [facility]) CM/SW Contact  Dalia GORMAN Fuse, RN Phone Number: 01/25/2024, 9:35 AM  Clinical Narrative:    The patient has bed offers from [facility] in [place] and facilities in [place]. The patient lives in [place] and doesn't have a preference. TOC selected [facility] in the hub and notified Tammy at [facility]. Nitchia to start ins auth.   Expected Discharge Plan: Home w Home Health Services Barriers to Discharge: Continued Medical Work up               Expected Discharge Plan and Services   Discharge Planning Services: CM Consult Post Acute Care Choice: Home Health, Skilled Nursing Facility Living arrangements for the past 2 months: Single Family Home                                       Social Drivers of Health (SDOH) Interventions SDOH Screenings   Food Insecurity: No Food Insecurity (01/20/2024)  Housing: Low Risk  (01/20/2024)  Transportation Needs: No Transportation Needs (01/20/2024)  Utilities: Not At Risk (01/20/2024)  Alcohol Screen: Low Risk  (03/29/2018)  Financial Resource Strain: Low Risk  (11/20/2023)   Received from [facility]  Social Connections: Socially Isolated (01/20/2024)  Tobacco Use: Low Risk  (01/23/2024)    Readmission Risk Interventions     No data to display

## 2024-01-25 NOTE — Plan of Care (Signed)
  Problem: Coping: Goal: Ability to adjust to condition or change in health will improve Outcome: Progressing   Problem: Fluid Volume: Goal: Ability to maintain a balanced intake and output will improve Outcome: Not Progressing   Problem: Health Behavior/Discharge Planning: Goal: Ability to identify and utilize available resources and services will improve Outcome: Progressing Goal: Ability to manage health-related needs will improve Outcome: Progressing

## 2024-02-03 ENCOUNTER — Telehealth: Payer: Self-pay

## 2024-02-03 NOTE — Telephone Encounter (Signed)
 Levorn Chester -physical therapist - called and left a message. She is asking about weight bearing status. Patient is currently limited to putting weight on the inside of the foot? Asking for details about the fracture and if she is NWB or WB. thanks [phone] Levorn Chester

## 2024-03-01 NOTE — Telephone Encounter (Signed)
 [facility] PT called, asking about WB status - called and left a detailed message that she needs an appointment for xrays and evaluation before we can recommend WB status. Asked Nat HERO to call patient to schedule -thanks

## 2024-03-02 NOTE — Telephone Encounter (Signed)
 Patient contacted office to schedule appointment. She has been scheduled for 03/16/2024 per her request/availability.

## 2024-03-16 ENCOUNTER — Ambulatory Visit: Admitting: Podiatry

## 2024-03-24 ENCOUNTER — Other Ambulatory Visit: Payer: Self-pay | Admitting: Nephrology

## 2024-03-24 DIAGNOSIS — N184 Chronic kidney disease, stage 4 (severe): Secondary | ICD-10-CM

## 2024-03-24 DIAGNOSIS — R809 Proteinuria, unspecified: Secondary | ICD-10-CM

## 2024-03-28 ENCOUNTER — Ambulatory Visit: Admitting: Podiatry

## 2024-04-06 ENCOUNTER — Ambulatory Visit: Admitting: Podiatry
